# Patient Record
Sex: Female | Born: 1970
Health system: Southern US, Community
[De-identification: ages and names within clinical notes are randomized; demographics above are authoritative.]

## PROBLEM LIST (undated history)

## (undated) DIAGNOSIS — N189 Chronic kidney disease, unspecified: Secondary | ICD-10-CM

## (undated) DIAGNOSIS — D649 Anemia, unspecified: Secondary | ICD-10-CM

## (undated) DIAGNOSIS — Z1371 Encounter for nonprocreative screening for genetic disease carrier status: Secondary | ICD-10-CM

## (undated) DIAGNOSIS — N139 Obstructive and reflux uropathy, unspecified: Secondary | ICD-10-CM

## (undated) DIAGNOSIS — Z8041 Family history of malignant neoplasm of ovary: Secondary | ICD-10-CM

## (undated) HISTORY — PX: TONSILLECTOMY: SUR1361

## (undated) HISTORY — PX: BACK SURGERY: SHX140

## (undated) HISTORY — DX: Encounter for nonprocreative screening for genetic disease carrier status: Z13.71

## (undated) HISTORY — PX: APPENDECTOMY: SHX54

## (undated) HISTORY — DX: Chronic kidney disease, unspecified: N18.9

## (undated) HISTORY — PX: CHOLECYSTECTOMY: SHX55

## (undated) HISTORY — PX: ABDOMINAL HYSTERECTOMY: SHX81

## (undated) HISTORY — DX: Family history of malignant neoplasm of ovary: Z80.41

## (undated) HISTORY — DX: Anemia, unspecified: D64.9

---

## 2003-10-27 ENCOUNTER — Emergency Department: Payer: Self-pay | Admitting: Emergency Medicine

## 2004-05-13 ENCOUNTER — Emergency Department: Payer: Self-pay | Admitting: Emergency Medicine

## 2004-05-13 ENCOUNTER — Other Ambulatory Visit: Payer: Self-pay

## 2004-11-07 ENCOUNTER — Emergency Department: Payer: Self-pay | Admitting: Emergency Medicine

## 2005-04-21 ENCOUNTER — Ambulatory Visit: Payer: Self-pay | Admitting: Unknown Physician Specialty

## 2005-05-11 ENCOUNTER — Inpatient Hospital Stay: Payer: Self-pay | Admitting: Unknown Physician Specialty

## 2005-05-22 ENCOUNTER — Ambulatory Visit: Payer: Self-pay | Admitting: Unknown Physician Specialty

## 2005-05-27 ENCOUNTER — Other Ambulatory Visit: Payer: Self-pay

## 2005-05-27 ENCOUNTER — Inpatient Hospital Stay: Payer: Self-pay | Admitting: Unknown Physician Specialty

## 2005-10-18 ENCOUNTER — Ambulatory Visit: Payer: Self-pay | Admitting: Internal Medicine

## 2005-10-24 ENCOUNTER — Encounter (INDEPENDENT_AMBULATORY_CARE_PROVIDER_SITE_OTHER): Payer: Self-pay | Admitting: Specialist

## 2005-10-24 ENCOUNTER — Ambulatory Visit: Payer: Self-pay | Admitting: Internal Medicine

## 2005-11-03 ENCOUNTER — Ambulatory Visit: Payer: Self-pay | Admitting: Unknown Physician Specialty

## 2005-11-09 ENCOUNTER — Emergency Department: Payer: Self-pay | Admitting: Emergency Medicine

## 2005-11-09 ENCOUNTER — Other Ambulatory Visit: Payer: Self-pay

## 2006-01-01 ENCOUNTER — Ambulatory Visit: Payer: Self-pay | Admitting: Internal Medicine

## 2006-03-14 ENCOUNTER — Ambulatory Visit: Payer: Self-pay

## 2006-03-23 ENCOUNTER — Ambulatory Visit: Payer: Self-pay | Admitting: Unknown Physician Specialty

## 2006-04-02 ENCOUNTER — Ambulatory Visit: Payer: Self-pay | Admitting: Unknown Physician Specialty

## 2006-04-04 ENCOUNTER — Ambulatory Visit: Payer: Self-pay

## 2006-04-07 ENCOUNTER — Emergency Department: Payer: Self-pay | Admitting: General Practice

## 2006-06-20 ENCOUNTER — Emergency Department: Payer: Self-pay | Admitting: General Practice

## 2006-07-05 ENCOUNTER — Ambulatory Visit: Payer: Self-pay | Admitting: Internal Medicine

## 2006-09-07 ENCOUNTER — Emergency Department: Payer: Self-pay | Admitting: Emergency Medicine

## 2006-09-07 ENCOUNTER — Ambulatory Visit: Payer: Self-pay

## 2006-09-07 ENCOUNTER — Other Ambulatory Visit: Payer: Self-pay

## 2006-10-20 ENCOUNTER — Other Ambulatory Visit: Payer: Self-pay

## 2006-10-20 ENCOUNTER — Emergency Department: Payer: Self-pay | Admitting: Emergency Medicine

## 2006-10-21 ENCOUNTER — Ambulatory Visit: Payer: Self-pay | Admitting: Emergency Medicine

## 2006-12-17 ENCOUNTER — Emergency Department (HOSPITAL_COMMUNITY): Admission: EM | Admit: 2006-12-17 | Discharge: 2006-12-17 | Payer: Self-pay | Admitting: Emergency Medicine

## 2007-02-27 ENCOUNTER — Emergency Department: Payer: Self-pay | Admitting: Emergency Medicine

## 2007-03-13 ENCOUNTER — Other Ambulatory Visit: Payer: Self-pay

## 2007-03-13 ENCOUNTER — Emergency Department: Payer: Self-pay | Admitting: Emergency Medicine

## 2007-07-05 ENCOUNTER — Ambulatory Visit: Payer: Self-pay | Admitting: Internal Medicine

## 2007-07-16 ENCOUNTER — Ambulatory Visit: Payer: Self-pay

## 2007-11-01 ENCOUNTER — Ambulatory Visit: Payer: Self-pay

## 2007-12-18 ENCOUNTER — Ambulatory Visit: Payer: Self-pay | Admitting: General Practice

## 2007-12-30 ENCOUNTER — Ambulatory Visit: Payer: Self-pay | Admitting: General Practice

## 2008-01-14 ENCOUNTER — Encounter: Payer: Self-pay | Admitting: General Practice

## 2008-02-10 ENCOUNTER — Encounter: Payer: Self-pay | Admitting: General Practice

## 2008-03-09 ENCOUNTER — Encounter: Payer: Self-pay | Admitting: General Practice

## 2008-04-09 ENCOUNTER — Encounter: Payer: Self-pay | Admitting: General Practice

## 2009-01-12 ENCOUNTER — Ambulatory Visit: Payer: Self-pay | Admitting: Cardiovascular Disease

## 2010-02-02 ENCOUNTER — Ambulatory Visit: Payer: Self-pay | Admitting: Unknown Physician Specialty

## 2010-02-20 ENCOUNTER — Emergency Department: Payer: Self-pay | Admitting: Emergency Medicine

## 2010-03-24 ENCOUNTER — Ambulatory Visit: Payer: Self-pay | Admitting: Neurosurgery

## 2010-05-24 NOTE — Consult Note (Signed)
NAMEZOHAR, LAING                 ACCOUNT NO.:  [id number]   MEDICAL RECORD NO.:  [id number]          PATIENT TYPE:  EMS   LOCATION:  MAJO                         FACILITY:  [facility]   PHYSICIAN:  Pramod P. Pearlean Brownie, MD    DATE OF BIRTH:  11-30-1970   DATE OF CONSULTATION:  12/17/2006  DATE OF DISCHARGE:                                 CONSULTATION   REASON FOR REFERRAL:  Code stroke.   HISTORY OF PRESENT ILLNESS:  Ms. Winstanley is a 40 year old [place] lady  who developed the sudden onset of left facial numbness at about 10:30  a.m. today.  She was apparently in a class talking to some other ladies,  and she complained of her numbness in the face.  She was subsequently  slowly laid down without actually falling down.  She was subsequently  found to have a depressed level of sensorium, not moving the left side  well.  EMS were called and they found her to be intermittently opening  her eyes and following commands.  Dr. Ignacia Palma saw the patient and  noticed some drawing of the face on the left side.  Subsequently, a CT  scan of the head was obtained which showed no acute abnormality.  She  has no known prior history of stroke, TIA, seizures, migraine headaches  or significant neurological problems.   PAST MEDICAL HISTORY:  1. Constipation.  2. Depression.  3. Hypertension.  4. Hypoglycemia.   HOME MEDICATIONS:  1. Lasix.  2. Estrogen.   PAST SURGICAL HISTORY:  Hysterectomy.   SOCIAL HISTORY:  The patient is married and lives with her husband in  [place].  She does not smoke or drink.   PHYSICAL EXAMINATION:  GENERAL:  An obese young Arabic lady who is not  in distress.  VITAL SIGNS:  She is afebrile, pulse rate is 88 per minute and regular,  respiratory 22 per minute, blood pressure 138/123.  Distal pulses well  felt.  HEENT:  The head is nontraumatic.  ENT exam unremarkable.  NECK:  Supple.  There is no bruit.  CARDIAC:  Regular heart sounds.  LUNGS:  Clear to  auscultation.  NEUROLOGIC:  The patient is awake but she tends to keep her eyes closed.  She does open her eyes and follows commands.  Her speech is slow and  hesitant but without significant dysarthria.  She follows commands well.  She is oriented to time, place and person.  Eye movements are full  range.  There is no nystagmus.  She blinks to threat bilaterally.  She  keeps the left side of her face drawn; when she is distracted, the face  seems fairly symmetric.  When she is asked to smile, she smiles  asymmetrically, and the right side of the face does not move as much.  Her tongue is midline.  Motor system exam reveals subjective decreased  movement on the left side.  She is able to move her left leg and hand  off the bed, but she is unable to hold it against gravity.  When her  left upper extremity is held  on top of her face, she moves it down  without striking the face, but when it is held up above her belly, it  falls flat.  She has very variable strength testing of the left side  when she is distracted.  Her strength is variable.  She has subjective  decrease in touch and pinprick sensation in the right upper extremity  and left lower extremity which is crossed.  Deep tendon reflexes are  symmetric.  Plantars are both downgoing.  Data reviewed.  CT scan of the  head noncontrast study is unremarkable.  There is no acute abnormality  noted.  Admission labs revealed a slightly low potassium of 3.4, a  __________  of 150 and ALT of 39.  The patient has an urticarial rash  over her upper extremities which, as per the husband, has been around  for a couple of weeks.   IMPRESSION:  A 36-year lady with symptoms of sudden onset of left facial  numbness and left body weakness with a neurological exam which shows  variable left-sided weakness and sensory loss which is non-anatomical.  Her history exam findings are likely consistent with conversion  reaction; I doubt this represents acute  stroke.  She has no known risk  factors, and her exam is extremely variable.   PLAN:  I had a long discussion with the patient and her husband in  regards to her presentation.  Plan for evaluation and treatment.  I  would recommend that we obtain an MRI scan to rule out a stroke, though  I doubt this based on history and exam.  She most likely has some  underlying psychosocial stressors which may be playing a role.  She has  an urticarial rash, and evaluation and treatment for this for possible  allergy as per ER physician.   Thank you for the referral.           ______________________________  Sunny Schlein. Pearlean Brownie, MD     PPS/MEDQ  D:  12/17/2006  T:  12/17/2006  Job:  [id number]

## 2010-05-27 NOTE — Assessment & Plan Note (Signed)
[facility]                           GASTROENTEROLOGY OFFICE NOTE   Vickie Turner, Vickie Turner                        MRN:          [id number]  DATE:10/18/2005                            DOB:          [birth year]    REFERRING PHYSICIAN:  Constance Kincius   HISTORY OF PRESENT ILLNESS:  The patient is a very nice, 40 year old,  [place] female.  She is here at the referral of Dr. Harold Hedge for  colonoscopy as part of the evaluation of pelvic pain.  Dr. Harold Hedge has  forwarded complete progress notes dating back to June of this year.  Ms.  Turner suffers from left lower quadrant abdominal pain and rectal pain.  She  has a history of extensive endometriosis requiring multiple surgeries.  Her  last operation was on May 13, 2005, with supracervical hysterectomy and left  salpingo-oophorectomy and laparoscopic right simple oophorectomy and lysis  of adhesions on May 27, 2005.  Her pelvic pain has increased and is  associated with sitting, standing, and any sort of exercise.  She also has  dyspareunia and pain with bowel movements.  She is actually afraid to push  because of severe rectal pain with evacuation.  She denies any rectal  bleeding.  Because of chronic constipation, she has resorted to over-the-  counter laxatives, Ex-Lax 3-4 tablets several times a week.  The patient had  normal bowel habits until about 15 years ago, then developed progressive  constipation.  She has 4 children, ages 38, 53, 92 and 61.  Her level of  activity has markedly decreased since the last surgery because of pain with  exertion.  Consequently she has gained some weight.  There is a positive  family history of colon cancer in her sister who died at age 60 in [place].   MEDICATIONS:  Estrogen hormones.   PAST SURGICAL HISTORY:  1. Hysterectomy.  2. Appendectomy.  3. Back surgery.   FAMILY HISTORY:  Positive for colon cancer, ovarian cancer in another  sister, uterine cancer in  another sister, heart disease in brother.   SOCIAL HISTORY:  She is married with 4 children.  She has a Systems analyst.  She is currently a Consulting civil engineer.  She does not smoke, does not drink  alcohol.   REVIEW OF SYSTEMS:  Weight gain, swelling of her feet, complain of sleeping  problems, anemia, back pain, headaches, severe fatigue, muscle cramps.   PHYSICAL EXAMINATION:  VITAL SIGNS:  Blood pressure 132/80, pulse 80, weight  227 pounds.  GENERAL:  She is mildly overweight, alert and oriented in no distress.  LUNGS:  Clear to auscultation.  CARDIOVASCULAR:  Normal S1 and S2.  ABDOMEN:  Soft.  Mildly obese but relaxes.  Normal active bowel sounds.  There was diffuse tenderness throughout the abdomen even on light touch,  more so however in both lower quadrants and most in the left lower quadrant  all the way to the left pubic ramus and all the way to the left upper  quadrant.  I could not appreciate any fullness or mass or stool.  There was  no  rebound.  Straight leg raising was negative.  There was no costovertebral  angle tenderness on that side.  RECTAL:  Mildly tender rectal sphincter, which was normal otherwise.  Digital examination was quite painful especially the palpation of the rectal  ampulla circumferentially seemed to precipitate a lot of discomfort.  Stool  was found, hemoccult negative, in small amounts.   IMPRESSION:  75. A 40 year old [place] female with severe pelvic pain and rectal pain.      Her entire pelvic floor seems to be very sensitive.  Her symptoms of      pain are aggravated by constipation which is partly functional and      partly may be related to use of pain medication as well as inactivity.      There is no evidence of occult gastrointestinal blood loss but she has      a strong family history of colon cancer in her sister who died at      rather young age.  Although I doubt that she has any serious structural      lesions of the colon, she would  benefit from colonoscopy to assess the      rectum, sigmoid colon and rule out thickening, partial obstruction or      extrinsic compression, also to rule out redundancy of her left colon.  2. Severe endometriosis of pelvis, status post hysterectomy and bilateral      salpingo-oophorectomy, rule out residual endometriosis versus      adhesions.   PLAN:  1. Colonoscopy scheduled with routine colonoscopic prep.  2. Start Amitiza 25 mcg b.i.d.  3. Levsin sublingually 0.125 mg on trial basis until colonoscopy to see      whether antispasmodic may help her pelvic pain.   Thank you.       Hedwig Morton. Juanda Chance, MD      DMB/MedQ  DD:  10/18/2005  DT:  10/20/2005  Job #:  [id number]   cc:   Vickie Barth, MD

## 2010-05-27 NOTE — Assessment & Plan Note (Signed)
[facility]                         GASTROENTEROLOGY OFFICE NOTE   FORESTINE, MACHO                        MRN:          [id number]  DATE:01/01/2006                            DOB:          1970/03/20    Vickie Turner is a 40 year old female who we have been evaluating for  chronic  pelvic pain and chronic constipation.  As part of the  evaluation she underwent colonoscopy on October 24, 2005 which showed  normal colon anatomy but the exam was rather difficult because she had  an extreme amount of pain throughout the entire colon exam, requiring a  large amount of conscious sedation.  There was no evidence of  redundancy, extrinsic compression or any mucosal disease.  She was put  on Amitiza 24 mcg twice a day and hyoscyamine sublingually 0.125 mg  twice a day, which worked very well for about a month, but then the  effects have worn out and she is back to being quite constipated and  having pain.  The pain is mostly in the left lower quadrant and across  lower abdomen.  It is partly relieved by having a bowel movement.  She  is now currently on Ex-Lax 4 to 5 every night, which gives her cramps  and usually moves a formed stool in the morning.  She denies any rectal  bleeding.  She tried to make an appointment with Dr. Mechele Claude in  [place] but required referral from a physician.   MEDICATIONS:  1. Estrogen.  2. Amitiza 24 mcg b.i.d.  3. Levsin 0.125 mg p.r.n.  4. Ex-Lax.   PHYSICAL EXAMINATION:  Blood pressure 110/70, pulse 78, weight 236  pounds which represents a weight gain of 9 pounds since we last saw her  on October 18, 2005.  ABDOMEN:  Today shows diffusely tender abdomen with normoactive bowel  sounds, most of the tenderness in the left low and middle quadrants but  also around the transverse colon and the right colon all the way to the  cecum.  There is no distention, no palpable mass and no rebound.  RECTAL:  Not repeated  today.   IMPRESSION:  A 40 year old [place] female who has chronic constipation  and chronic pelvic pain with history of endometriosis and negative  colonoscopy exam within the last 2 months.  She had an unremarkable MRI  over the lumbosacral spine which showed 3-4, 4-5, and 5-1 disc  degeneration but no protrusions, no paraspinal lesions.   PLAN:  I have discussed the use of laxative with Vickie Turner.  We  will  start her on  MiraLax 17 gm every morning and Ex-Lax 1 or 2 tablets at  bedtime.  At the same time, I am making a referral for her to see Dr.  Mechele Claude in [facility] for pelvic floor dysfunction,  constipation, to rule out functional rectal problems, rectocele.  I  would like to see her again in 6 to 8 weeks to possibly adjust her  laxative regimen.     Hedwig Morton. Juanda Chance, MD  Electronically Signed    DMB/MedQ  DD: 01/01/2006  DT: 01/01/2006  Job #: [id number]   cc:   Ladean Raya Kincius

## 2010-10-17 LAB — DIFFERENTIAL
Basophils Absolute: 0
Basophils Relative: 0
Eosinophils Absolute: 0.1 — ABNORMAL LOW
Lymphs Abs: 2.6
Monocytes Absolute: 0.4
Monocytes Relative: 4
Neutrophils Relative %: 65

## 2010-10-17 LAB — URINE MICROSCOPIC-ADD ON

## 2010-10-17 LAB — CBC
HCT: 39.7
Hemoglobin: 13.7
MCHC: 34.5
MCV: 78.2
RBC: 5.07
RDW: 13
WBC: 8.9

## 2010-10-17 LAB — URINE CULTURE

## 2010-10-17 LAB — COMPREHENSIVE METABOLIC PANEL
AST: 32
Albumin: 3.9
Alkaline Phosphatase: 150 — ABNORMAL HIGH
BUN: 11
Potassium: 3.4 — ABNORMAL LOW
Sodium: 137

## 2010-10-17 LAB — PROTIME-INR
INR: 1
Prothrombin Time: 13.2

## 2010-10-17 LAB — URINALYSIS, ROUTINE W REFLEX MICROSCOPIC
Hgb urine dipstick: NEGATIVE
Nitrite: NEGATIVE
Protein, ur: NEGATIVE
Specific Gravity, Urine: 1.011

## 2010-10-17 LAB — APTT: aPTT: 28

## 2010-10-17 LAB — CK TOTAL AND CKMB (NOT AT ARMC)
CK, MB: 0.7
Total CK: 152

## 2011-04-04 ENCOUNTER — Emergency Department: Payer: Self-pay | Admitting: Emergency Medicine

## 2011-04-04 LAB — COMPREHENSIVE METABOLIC PANEL
Albumin: 3.8 g/dL (ref 3.4–5.0)
BUN: 11 mg/dL (ref 7–18)
Co2: 30 mmol/L (ref 21–32)
Creatinine: 0.65 mg/dL (ref 0.60–1.30)
EGFR (Non-African Amer.): >60
Glucose: 74 mg/dL (ref 65–99)
Osmolality: 287 (ref 275–301)
SGOT(AST): 24 U/L (ref 15–37)
SGPT (ALT): 19 U/L
Sodium: 145 mmol/L (ref 136–145)

## 2011-04-04 LAB — CBC
HCT: 38.1 % (ref 35.0–47.0)
HGB: 13 g/dL (ref 12.0–16.0)
MCH: 27.5 pg (ref 26.0–34.0)
MCHC: 34.2 g/dL (ref 32.0–36.0)

## 2011-04-04 LAB — TROPONIN I: Troponin-I: <0.02 ng/mL

## 2011-06-09 ENCOUNTER — Emergency Department: Payer: Self-pay | Admitting: Emergency Medicine

## 2011-06-09 LAB — CBC
HGB: 11.1 g/dL — ABNORMAL LOW (ref 12.0–16.0)
MCH: 27.6 pg (ref 26.0–34.0)
MCHC: 34.2 g/dL (ref 32.0–36.0)
MCV: 81 fL (ref 80–100)
Platelet: 181 10*3/uL (ref 150–440)
RBC: 4.04 10*6/uL (ref 3.80–5.20)

## 2011-06-09 LAB — BASIC METABOLIC PANEL
Anion Gap: 8 (ref 7–16)
BUN: 12 mg/dL (ref 7–18)
Calcium, Total: 8.2 mg/dL — ABNORMAL LOW (ref 8.5–10.1)
Chloride: 108 mmol/L — ABNORMAL HIGH (ref 98–107)
Co2: 27 mmol/L (ref 21–32)
EGFR (African American): >60
EGFR (Non-African Amer.): >60
Osmolality: 283 (ref 275–301)
Potassium: 3.3 mmol/L — ABNORMAL LOW (ref 3.5–5.1)
Sodium: 143 mmol/L (ref 136–145)

## 2011-06-09 LAB — URINALYSIS, COMPLETE
Glucose,UR: NEGATIVE mg/dL (ref 0–75)
Ketone: NEGATIVE
Leukocyte Esterase: NEGATIVE
Nitrite: NEGATIVE
Ph: 8 (ref 4.5–8.0)
RBC,UR: 1 /HPF (ref 0–5)
Specific Gravity: 1.01 (ref 1.003–1.030)
Squamous Epithelial: NONE SEEN
WBC UR: 2 /HPF (ref 0–5)

## 2011-11-16 ENCOUNTER — Ambulatory Visit: Payer: Self-pay | Admitting: Neurology

## 2012-02-25 ENCOUNTER — Emergency Department: Payer: Self-pay | Admitting: Emergency Medicine

## 2012-02-25 LAB — URINALYSIS, COMPLETE
Bilirubin,UR: NEGATIVE
Glucose,UR: NEGATIVE mg/dL (ref 0–75)
Ketone: NEGATIVE
Nitrite: NEGATIVE
Ph: 5 (ref 4.5–8.0)
Protein: NEGATIVE
Specific Gravity: 1.026 (ref 1.003–1.030)
Squamous Epithelial: <1

## 2012-02-25 LAB — WET PREP, GENITAL

## 2013-05-01 ENCOUNTER — Other Ambulatory Visit: Payer: Self-pay | Admitting: Orthopedic Surgery

## 2013-05-01 DIAGNOSIS — M1710 Unilateral primary osteoarthritis, unspecified knee: Secondary | ICD-10-CM

## 2013-05-01 DIAGNOSIS — M25561 Pain in right knee: Secondary | ICD-10-CM

## 2013-05-04 ENCOUNTER — Other Ambulatory Visit: Payer: Self-pay

## 2013-05-09 ENCOUNTER — Other Ambulatory Visit: Payer: Self-pay

## 2013-05-10 ENCOUNTER — Ambulatory Visit
Admission: RE | Admit: 2013-05-10 | Discharge: 2013-05-10 | Disposition: A | Discharge status: Home / Self Care | Payer: BC Managed Care – PPO | Source: Ambulatory Visit | Attending: Orthopedic Surgery | Admitting: Orthopedic Surgery

## 2013-05-10 DIAGNOSIS — M1710 Unilateral primary osteoarthritis, unspecified knee: Secondary | ICD-10-CM

## 2013-05-10 DIAGNOSIS — M25561 Pain in right knee: Secondary | ICD-10-CM

## 2013-05-19 ENCOUNTER — Emergency Department: Payer: Self-pay | Admitting: Emergency Medicine

## 2013-05-19 LAB — CBC WITH DIFFERENTIAL/PLATELET
BASOS PCT: 0.4 %
Basophil #: 0 10*3/uL (ref 0.0–0.1)
Eosinophil #: 0.1 10*3/uL (ref 0.0–0.7)
Eosinophil %: 0.8 %
HCT: 40.4 % (ref 35.0–47.0)
HGB: 13.5 g/dL (ref 12.0–16.0)
LYMPHS PCT: 45.1 %
Lymphocyte #: 3.2 10*3/uL (ref 1.0–3.6)
MCH: 26.7 pg (ref 26.0–34.0)
MCHC: 33.6 g/dL (ref 32.0–36.0)
MCV: 80 fL (ref 80–100)
MONO ABS: 0.4 x10 3/mm (ref 0.2–0.9)
MONOS PCT: 6.1 %
Neutrophil #: 3.3 10*3/uL (ref 1.4–6.5)
Neutrophil %: 47.6 %
Platelet: 219 10*3/uL (ref 150–440)
RBC: 5.06 10*6/uL (ref 3.80–5.20)
RDW: 14.3 % (ref 11.5–14.5)
WBC: 7 10*3/uL (ref 3.6–11.0)

## 2013-05-19 LAB — BASIC METABOLIC PANEL
Anion Gap: 5 — ABNORMAL LOW (ref 7–16)
BUN: 13 mg/dL (ref 7–18)
CALCIUM: 9 mg/dL (ref 8.5–10.1)
CO2: 33 mmol/L — AB (ref 21–32)
Chloride: 101 mmol/L (ref 98–107)
Creatinine: 0.84 mg/dL (ref 0.60–1.30)
EGFR (African American): >60
Glucose: 81 mg/dL (ref 65–99)
OSMOLALITY: 277 (ref 275–301)
POTASSIUM: 3.5 mmol/L (ref 3.5–5.1)
Sodium: 139 mmol/L (ref 136–145)

## 2013-05-19 LAB — URINALYSIS, COMPLETE
BLOOD: NEGATIVE
Bacteria: NEGATIVE
Bilirubin,UR: NEGATIVE
Glucose,UR: NEGATIVE mg/dL (ref 0–75)
KETONE: NEGATIVE
LEUKOCYTE ESTERASE: NEGATIVE
NITRITE: NEGATIVE
PROTEIN: NEGATIVE
Ph: 7 (ref 4.5–8.0)
RBC,UR: NONE SEEN /HPF (ref 0–5)
SPECIFIC GRAVITY: 1.01 (ref 1.003–1.030)

## 2013-05-19 LAB — PREGNANCY, URINE: PREGNANCY TEST, URINE: NEGATIVE m[IU]/mL

## 2013-05-19 LAB — TROPONIN I: Troponin-I: <0.02 ng/mL

## 2013-11-12 ENCOUNTER — Encounter: Payer: Self-pay | Admitting: *Deleted

## 2013-11-13 ENCOUNTER — Ambulatory Visit (INDEPENDENT_AMBULATORY_CARE_PROVIDER_SITE_OTHER): Payer: BC Managed Care – PPO | Admitting: Cardiovascular Disease

## 2013-11-13 ENCOUNTER — Encounter: Payer: Self-pay | Admitting: Cardiovascular Disease

## 2013-11-13 ENCOUNTER — Encounter (INDEPENDENT_AMBULATORY_CARE_PROVIDER_SITE_OTHER): Payer: Self-pay

## 2013-11-13 VITALS — BP 110/80 | HR 63 | Ht 68.0 in | Wt 181.5 lb

## 2013-11-13 DIAGNOSIS — R002 Palpitations: Secondary | ICD-10-CM

## 2013-11-13 DIAGNOSIS — R079 Chest pain, unspecified: Secondary | ICD-10-CM

## 2013-11-13 DIAGNOSIS — R0602 Shortness of breath: Secondary | ICD-10-CM

## 2013-11-13 DIAGNOSIS — R000 Tachycardia, unspecified: Secondary | ICD-10-CM

## 2013-11-13 NOTE — Patient Instructions (Signed)
Your physician has requested that you have an echocardiogram. Echocardiography is a painless test that uses sound waves to create images of your heart. It provides your doctor with information about the size and shape of your heart and how well your heart's chambers and valves are working. This procedure takes approximately one hour. There are no restrictions for this procedure.  Your physician has recommended that you wear a holter monitor. Holter monitors are medical devices that record the heart's electrical activity. Doctors most often use these monitors to diagnose arrhythmias. Arrhythmias are problems with the speed or rhythm of the heartbeat. The monitor is a small, portable device. You can wear one while you do your normal daily activities. This is usually used to diagnose what is causing palpitations/syncope (passing out).   Your physician recommends that you schedule a follow-up appointment in:  As needed

## 2013-11-14 ENCOUNTER — Encounter: Payer: Self-pay | Admitting: Cardiovascular Disease

## 2013-11-14 DIAGNOSIS — R0602 Shortness of breath: Secondary | ICD-10-CM | POA: Insufficient documentation

## 2013-11-14 DIAGNOSIS — R002 Palpitations: Secondary | ICD-10-CM | POA: Insufficient documentation

## 2013-11-14 NOTE — Assessment & Plan Note (Signed)
She reports increased dyspnea and chest pain at the same time with palpitations. Previous echocardiogram showed mild to moderate pulmonary hypertension with normal ejection fraction I requested a repeat echocardiogram for evaluation.

## 2013-11-14 NOTE — Progress Notes (Signed)
Primary care physician: Dr. Beverely RisenFozia Khan.   HPI  This is a pleasant 43 year old female who is here for evaluation of palpitations and shortness of breath.I saw her years ago at [facility] for chest pain which was felt to be due to esophageal spasm at that time. She responded to treatment with diltiazem. Echocardiogram done in 2011 showed normal LV systolic function, grade 1 diastolic dysfunction, mild mitral regurgitation, mild tricuspid regurgitation and mild pulmonary hypertension with estimated systolic pulmonary pressure between 40-50 mmHg. She is here for symptoms of increased palpitations which has been happening frequently almost every other day. It is described as palpitations associated with dizziness but no syncope or presyncope. She complains of increased fatigue, exertional dyspnea and occasional substernal chest tightness. She does not consume any caffeinated products. She had routine labs done recently and they were unremarkable including normal thyroid function. There has been no increased stress. Family history is negative for coronary artery disease or arrhythmia. Her brother had valvular heart disease due to rheumatic fever.  Allergies  Allergen Reactions  . Sulfa Antibiotics      Current Outpatient Prescriptions on File Prior to Visit  Medication Sig Dispense Refill  . levocetirizine (XYZAL) 5 MG tablet Take 5 mg by mouth as needed.     . zolpidem (AMBIEN) 10 MG tablet Take 10 mg by mouth at bedtime as needed for sleep.     No current facility-administered medications on file prior to visit.     Past Medical History  Diagnosis Date  . Hyperlipidemia   . Hypertension   . Back pain   . Chronic kidney disease      Past Surgical History  Procedure Laterality Date  . Appendectomy    . Back surgery    . Cholecystectomy    . Abdominal hysterectomy    . Tonsillectomy Bilateral      Family History  Problem Relation Age of Onset  . Heart Problems  Brother      History   Social History  . Marital Status: Married    Spouse Name: N/A    Number of Children: N/A  . Years of Education: N/A   Occupational History  . Not on file.   Social History Main Topics  . Smoking status: Never Smoker   . Smokeless tobacco: Not on file  . Alcohol Use: No  . Drug Use: No  . Sexual Activity: Not on file   Other Topics Concern  . Not on file   Social History Narrative     ROS A 10 point review of system was performed. It is negative other than that mentioned in the history of present illness.   PHYSICAL EXAM   BP 110/80 mmHg  Pulse 63  Ht 5\' 8"  (1.727 m)  Wt 181 lb 8 oz (82.328 kg)  BMI 27.60 kg/m2 Constitutional: She is oriented to person, place, and time. She appears well-developed and well-nourished. No distress.  HENT: No nasal discharge.  Head: Normocephalic and atraumatic.  Eyes: Pupils are equal and round. No discharge.  Neck: Normal range of motion. Neck supple. No JVD present. No thyromegaly present.  Cardiovascular: Normal rate, regular rhythm, normal heart sounds. Exam reveals no gallop and no friction rub. No murmur heard.  Pulmonary/Chest: Effort normal and breath sounds normal. No stridor. No respiratory distress. She has no wheezes. She has no rales. She exhibits no tenderness.  Abdominal: Soft. Bowel sounds are normal. She exhibits no distension. There is no tenderness. There is no rebound  and no guarding.  Musculoskeletal: Normal range of motion. She exhibits no edema and no tenderness.  Neurological: She is alert and oriented to person, place, and time. Coordination normal.  Skin: Skin is warm and dry. No rash noted. She is not diaphoretic. No erythema. No pallor.  Psychiatric: She has a normal mood and affect. Her behavior is normal. Judgment and thought content normal.     ZOX:WRUEAVEKG:normal sinus rhythm with no significant ST or T wave changes. Normal PR and QT intervals.   ASSESSMENT AND PLAN

## 2013-11-14 NOTE — Assessment & Plan Note (Signed)
She is having frequent palpitations which could be due to premature beats. Thyroid function was normal recently. There is no caffeine intake. I requested a 48-hour Holter monitor for evaluation.

## 2013-11-24 ENCOUNTER — Telehealth: Payer: Self-pay

## 2013-11-24 NOTE — Telephone Encounter (Signed)
Pt states she has not received her monitor yet. Please call and advise.

## 2013-11-25 NOTE — Telephone Encounter (Signed)
Notified patient [facility] her today for when to come have monitor placed.

## 2013-11-26 ENCOUNTER — Other Ambulatory Visit: Payer: BC Managed Care – PPO

## 2013-12-19 ENCOUNTER — Other Ambulatory Visit (INDEPENDENT_AMBULATORY_CARE_PROVIDER_SITE_OTHER): Payer: BC Managed Care – PPO

## 2013-12-19 ENCOUNTER — Other Ambulatory Visit: Payer: Self-pay

## 2013-12-19 DIAGNOSIS — R0602 Shortness of breath: Secondary | ICD-10-CM

## 2013-12-19 DIAGNOSIS — R079 Chest pain, unspecified: Secondary | ICD-10-CM

## 2013-12-26 ENCOUNTER — Telehealth: Payer: Self-pay | Admitting: *Deleted

## 2013-12-26 NOTE — Telephone Encounter (Signed)
Informed patient per Dr. Kirke CorinArida her holter showed:  Normal sinus rhythm with occasional PVC's   Patient verbalized understanding

## 2013-12-29 ENCOUNTER — Other Ambulatory Visit: Payer: Self-pay

## 2013-12-29 ENCOUNTER — Ambulatory Visit (INDEPENDENT_AMBULATORY_CARE_PROVIDER_SITE_OTHER): Payer: BC Managed Care – PPO | Admitting: *Deleted

## 2013-12-29 DIAGNOSIS — R002 Palpitations: Secondary | ICD-10-CM

## 2014-09-20 ENCOUNTER — Emergency Department: Payer: BLUE CROSS/BLUE SHIELD

## 2014-09-20 ENCOUNTER — Emergency Department
Admission: EM | Admit: 2014-09-20 | Discharge: 2014-09-20 | Disposition: A | Discharge status: Home / Self Care | Payer: BLUE CROSS/BLUE SHIELD | Attending: Emergency Medicine | Admitting: Emergency Medicine

## 2014-09-20 ENCOUNTER — Encounter: Payer: Self-pay | Admitting: Emergency Medicine

## 2014-09-20 DIAGNOSIS — Y998 Other external cause status: Secondary | ICD-10-CM | POA: Diagnosis not present

## 2014-09-20 DIAGNOSIS — Y92480 Sidewalk as the place of occurrence of the external cause: Secondary | ICD-10-CM | POA: Diagnosis not present

## 2014-09-20 DIAGNOSIS — I129 Hypertensive chronic kidney disease with stage 1 through stage 4 chronic kidney disease, or unspecified chronic kidney disease: Secondary | ICD-10-CM | POA: Diagnosis not present

## 2014-09-20 DIAGNOSIS — S99912A Unspecified injury of left ankle, initial encounter: Secondary | ICD-10-CM | POA: Diagnosis present

## 2014-09-20 DIAGNOSIS — W2209XA Striking against other stationary object, initial encounter: Secondary | ICD-10-CM | POA: Diagnosis not present

## 2014-09-20 DIAGNOSIS — Y9389 Activity, other specified: Secondary | ICD-10-CM | POA: Diagnosis not present

## 2014-09-20 DIAGNOSIS — S9002XA Contusion of left ankle, initial encounter: Secondary | ICD-10-CM | POA: Diagnosis not present

## 2014-09-20 DIAGNOSIS — N189 Chronic kidney disease, unspecified: Secondary | ICD-10-CM | POA: Insufficient documentation

## 2014-09-20 MED ORDER — IBUPROFEN 800 MG PO TABS: 800.0000 mg | ORAL_TABLET | Freq: Three times a day (TID) | ORAL | Status: DC | PRN

## 2014-09-20 MED ORDER — KETOROLAC TROMETHAMINE 60 MG/2ML IM SOLN
60.0000 mg | Freq: Once | INTRAMUSCULAR | Status: AC
  Administered 2014-09-20: 60 mg via INTRAMUSCULAR
  Filled 2014-09-20: qty 2

## 2014-09-20 MED ORDER — HYDROCODONE-ACETAMINOPHEN 5-325 MG PO TABS: 1.0000 | ORAL_TABLET | ORAL | Status: DC | PRN

## 2014-09-20 NOTE — ED Notes (Signed)
Patient to ER for swelling and pain to left ankle after hitting ankle (on curb?).

## 2014-09-20 NOTE — ED Provider Notes (Signed)
[facility] Emergency Department Provider Note  ____________________________________________  Time seen: Approximately 6:23 PM  I have reviewed the triage vital signs and the nursing notes.   HISTORY  Chief Complaint Ankle Pain    HPI Vickie Turner is a 44 y.o. female who presents for evaluation of left ankle pain after hitting on a curb prior to arrival. Lanes of difficulty ambulating.   Past Medical History  Diagnosis Date  . Hyperlipidemia   . Hypertension   . Back pain   . Chronic kidney disease     Patient Active Problem List   Diagnosis Date Noted  . SOB (shortness of breath) 11/14/2013  . Palpitations 11/14/2013    Past Surgical History  Procedure Laterality Date  . Appendectomy    . Back surgery    . Cholecystectomy    . Abdominal hysterectomy    . Tonsillectomy Bilateral     Current Outpatient Rx  Name  Route  Sig  Dispense  Refill  . HYDROcodone-acetaminophen (NORCO) 5-325 MG per tablet   Oral   Take 1-2 tablets by mouth every 4 (four) hours as needed for moderate pain.   10 tablet   0   . ibuprofen (ADVIL,MOTRIN) 800 MG tablet   Oral   Take 1 tablet (800 mg total) by mouth every 8 (eight) hours as needed.   30 tablet   0   . levocetirizine (XYZAL) 5 MG tablet   Oral   Take 5 mg by mouth as needed.          Marland Kitchen PREMARIN 0.3 MG tablet   Oral   Take 0.3 mg by mouth as needed.       0   . zolpidem (AMBIEN) 10 MG tablet   Oral   Take 10 mg by mouth at bedtime as needed for sleep.           Allergies Sulfa antibiotics  Family History  Problem Relation Age of Onset  . Heart Problems Brother     Social History Social History  Substance Use Topics  . Smoking status: Never Smoker   . Smokeless tobacco: None  . Alcohol Use: No   Review of Systems Constitutional: No fever/chills Eyes: No visual changes. ENT: No sore throat. Cardiovascular: Denies chest pain. Respiratory: Denies shortness of  breath. Gastrointestinal: No abdominal pain.  No nausea, no vomiting.  No diarrhea.  No constipation. Genitourinary: Negative for dysuria. Musculoskeletal: Positive for left ankle pain Skin: Negative for rash. Neurological: Negative for headaches, focal weakness or numbness.  10-point ROS otherwise negative.  ____________________________________________   PHYSICAL EXAM:  VITAL SIGNS: ED Triage Vitals  Enc Vitals Group     BP 09/20/14 1818 116/75 mmHg     Pulse Rate 09/20/14 1818 66     Resp 09/20/14 1818 20     Temp 09/20/14 1818 98.1 F (36.7 C)     Temp Source 09/20/14 1818 Oral     SpO2 09/20/14 1818 100 %     Weight 09/20/14 1818 160 lb (72.576 kg)     Height 09/20/14 1818  (1.727 m)     Head Cir --      Peak Flow --      Pain Score 09/20/14 1818 7     Pain Loc --      Pain Edu? --      Excl. in GC? --     Constitutional: Alert and oriented. Well appearing and in no acute distress. Musculoskeletal: No lower  extremity tenderness nor edema.  No joint effusions. Neurologic:  Normal speech and language. No gross focal neurologic deficits are appreciated. No gait instability. Skin:  Skin is warm, dry and intact. No rash noted. Psychiatric: Mood and affect are normal. Speech and behavior are normal.  ____________________________________________   LABS (all labs ordered are listed, but only abnormal results are displayed)  Labs Reviewed - No data to display ____________________________________________   RADIOLOGY  Negative for fracture. Lateral swelling. ____________________________________________   PROCEDURES  Procedure(s) performed: None  Critical Care performed: No  ____________________________________________   INITIAL IMPRESSION / ASSESSMENT AND PLAN / ED COURSE  Pertinent labs & imaging results that were available during my care of the patient were reviewed by me and considered in my medical decision making (see chart for details).  Acute  left ankle contusion with swelling noted. Stirrup splint plus Ace wrap provided crutches patient given prescription for ibuprofen 800 mg and hydrocodone for pain. Patient also PCP or return to the ER with any worsening symptomology. ____________________________________________   FINAL CLINICAL IMPRESSION(S) / ED DIAGNOSES  Final diagnoses:  Ankle contusion, left, initial encounter      Evangeline Dakin, PA-C 09/20/14 1914  Sharyn Creamer, MD 09/20/14 2150

## 2014-09-20 NOTE — Discharge Instructions (Signed)
Contusion °A contusion is a deep bruise. Contusions are the result of an injury that caused bleeding under the skin. The contusion may turn blue, purple, or yellow. Minor injuries will give you a painless contusion, but more severe contusions may stay painful and swollen for a few weeks.  °CAUSES  °A contusion is usually caused by a blow, trauma, or direct force to an area of the body. °SYMPTOMS  °· Swelling and redness of the injured area. °· Bruising of the injured area. °· Tenderness and soreness of the injured area. °· Pain. °DIAGNOSIS  °The diagnosis can be made by taking a history and physical exam. An X-ray, CT scan, or MRI may be needed to determine if there were any associated injuries, such as fractures. °TREATMENT  °Specific treatment will depend on what area of the body was injured. In general, the best treatment for a contusion is resting, icing, elevating, and applying cold compresses to the injured area. Over-the-counter medicines may also be recommended for pain control. Ask your caregiver what the best treatment is for your contusion. °HOME CARE INSTRUCTIONS  °· Put ice on the injured area. °¨ Put ice in a plastic bag. °¨ Place a towel between your skin and the bag. °¨ Leave the ice on for 15-20 minutes, 3-4 times a day, or as directed by your health care provider. °· Only take over-the-counter or prescription medicines for pain, discomfort, or fever as directed by your caregiver. Your caregiver may recommend avoiding anti-inflammatory medicines (aspirin, ibuprofen, and naproxen) for 48 hours because these medicines may increase bruising. °· Rest the injured area. °· If possible, elevate the injured area to reduce swelling. °SEEK IMMEDIATE MEDICAL CARE IF:  °· You have increased bruising or swelling. °· You have pain that is getting worse. °· Your swelling or pain is not relieved with medicines. °MAKE SURE YOU:  °· Understand these instructions. °· Will watch your condition. °· Will get help right  away if you are not doing well or get worse. °Document Released: 10/05/2004 Document Revised: 12/31/2012 Document Reviewed: 10/31/2010 °[employer]® Patient Information ©2015 [employer]. This information is not intended to replace advice given to you by your health care provider. Make sure you discuss any questions you have with your health care provider. ° °

## 2014-09-20 NOTE — ED Notes (Signed)

## 2014-09-23 ENCOUNTER — Encounter: Payer: Self-pay | Admitting: Emergency Medicine

## 2014-09-23 ENCOUNTER — Emergency Department
Admission: EM | Admit: 2014-09-23 | Discharge: 2014-09-23 | Disposition: A | Discharge status: Home / Self Care | Payer: BLUE CROSS/BLUE SHIELD | Attending: Emergency Medicine | Admitting: Emergency Medicine

## 2014-09-23 DIAGNOSIS — N390 Urinary tract infection, site not specified: Secondary | ICD-10-CM | POA: Diagnosis not present

## 2014-09-23 DIAGNOSIS — I129 Hypertensive chronic kidney disease with stage 1 through stage 4 chronic kidney disease, or unspecified chronic kidney disease: Secondary | ICD-10-CM | POA: Diagnosis not present

## 2014-09-23 DIAGNOSIS — R350 Frequency of micturition: Secondary | ICD-10-CM | POA: Diagnosis present

## 2014-09-23 DIAGNOSIS — N189 Chronic kidney disease, unspecified: Secondary | ICD-10-CM | POA: Insufficient documentation

## 2014-09-23 DIAGNOSIS — K120 Recurrent oral aphthae: Secondary | ICD-10-CM | POA: Insufficient documentation

## 2014-09-23 LAB — URINALYSIS COMPLETE WITH MICROSCOPIC (ARMC ONLY)
BILIRUBIN URINE: NEGATIVE
GLUCOSE, UA: NEGATIVE mg/dL
Hgb urine dipstick: NEGATIVE
Nitrite: NEGATIVE
Protein, ur: 100 mg/dL — AB
Specific Gravity, Urine: 1.023 (ref 1.005–1.030)
pH: 5 (ref 5.0–8.0)

## 2014-09-23 LAB — CBC
HEMATOCRIT: 34 % — AB (ref 35.0–47.0)
Hemoglobin: 11.4 g/dL — ABNORMAL LOW (ref 12.0–16.0)
MCH: 26 pg (ref 26.0–34.0)
MCHC: 33.4 g/dL (ref 32.0–36.0)
MCV: 77.8 fL — AB (ref 80.0–100.0)
Platelets: 197 10*3/uL (ref 150–440)
RBC: 4.37 MIL/uL (ref 3.80–5.20)
RDW: 13.6 % (ref 11.5–14.5)
WBC: 7.2 10*3/uL (ref 3.6–11.0)

## 2014-09-23 LAB — BASIC METABOLIC PANEL
ANION GAP: 9 (ref 5–15)
BUN: 14 mg/dL (ref 6–20)
CHLORIDE: 104 mmol/L (ref 101–111)
CO2: 27 mmol/L (ref 22–32)
Calcium: 9 mg/dL (ref 8.9–10.3)
Creatinine, Ser: 0.84 mg/dL (ref 0.44–1.00)
GFR calc Af Amer: >60 mL/min (ref >60)
GFR calc non Af Amer: >60 mL/min (ref >60)
GLUCOSE: 90 mg/dL (ref 65–99)
POTASSIUM: 3.2 mmol/L — AB (ref 3.5–5.1)
Sodium: 140 mmol/L (ref 135–145)

## 2014-09-23 MED ORDER — FLUCONAZOLE 100 MG PO TABS
100.0000 mg | ORAL_TABLET | Freq: Once | ORAL | Status: AC
  Administered 2014-09-23: 100 mg via ORAL
  Filled 2014-09-23: qty 1

## 2014-09-23 MED ORDER — DEXTROSE 5 % IV SOLN
1.0000 g | Freq: Once | INTRAVENOUS | Status: AC
  Administered 2014-09-23: 1 g via INTRAVENOUS
  Filled 2014-09-23: qty 10

## 2014-09-23 NOTE — ED Provider Notes (Signed)
[place] [facility] Emergency Department Provider Note  Time seen: 6:32 PM  I have reviewed the triage vital signs and the nursing notes.   HISTORY  Chief Complaint Urinary Frequency    HPI Vickie Turner is a 44 y.o. female with a past medical history of hyperlipidemia, hypertension, chronic kidney disease, who presents the emergency department with a urinary tract infection. According to the patient ever since a kidney surgery back in April she has been battling intermittent urinary tract infections.Dr. Welton Flakes has been seeing her for these infections, including today. She spoke with me earlier today and wished to send the patient to the emergency department for a round of IV Rocephin, with a plan to discharge the patient and continue daily IV Rocephin in the office. Patient denies any recent nausea, vomiting. She does state continued dysuria with pain in her right flank at times. States chills last night, but denies any measured temperature. Describes her right flank pain is mild.    Past Medical History  Diagnosis Date  . Hyperlipidemia   . Hypertension   . Back pain   . Chronic kidney disease     Patient Active Problem List   Diagnosis Date Noted  . SOB (shortness of breath) 11/14/2013  . Palpitations 11/14/2013    Past Surgical History  Procedure Laterality Date  . Appendectomy    . Back surgery    . Cholecystectomy    . Abdominal hysterectomy    . Tonsillectomy Bilateral     Current Outpatient Rx  Name  Route  Sig  Dispense  Refill  . HYDROcodone-acetaminophen (NORCO) 5-325 MG per tablet   Oral   Take 1-2 tablets by mouth every 4 (four) hours as needed for moderate pain.   10 tablet   0   . ibuprofen (ADVIL,MOTRIN) 800 MG tablet   Oral   Take 1 tablet (800 mg total) by mouth every 8 (eight) hours as needed.   30 tablet   0   . levocetirizine (XYZAL) 5 MG tablet   Oral   Take 5 mg by mouth as needed.          Marland Kitchen PREMARIN 0.3 MG tablet  Oral   Take 0.3 mg by mouth as needed.       0   . zolpidem (AMBIEN) 10 MG tablet   Oral   Take 10 mg by mouth at bedtime as needed for sleep.           Allergies Sulfa antibiotics  Family History  Problem Relation Age of Onset  . Heart Problems Brother     Social History Social History  Substance Use Topics  . Smoking status: Never Smoker   . Smokeless tobacco: None  . Alcohol Use: No    Review of Systems Constitutional: Negative for fever. Positive for chills. Cardiovascular: Negative for chest pain. Respiratory: Negative for shortness of breath. Gastrointestinal: Positive for mild right flank pain. Negative for nausea, vomiting, diarrhea. Genitourinary: Positive for dysuria Neurological: Negative for headache 10-point ROS otherwise negative.  ____________________________________________   PHYSICAL EXAM:  VITAL SIGNS: ED Triage Vitals  Enc Vitals Group     BP 09/23/14 1746 122/68 mmHg     Pulse Rate 09/23/14 1746 64     Resp 09/23/14 1746 18     Temp 09/23/14 1746 97.7 F (36.5 C)     Temp Source 09/23/14 1746 Oral     SpO2 09/23/14 1746 100 %     Weight 09/23/14 1746 163 lb (  73.936 kg)     Height 09/23/14 1746  (1.727 m)     Head Cir --      Peak Flow --      Pain Score 09/23/14 1747 10     Pain Loc --      Pain Edu? --      Excl. in GC? --     Constitutional: Alert and oriented. Well appearing and in no distress. Eyes: Normal exam ENT   Head: Normocephalic and atraumatic.   Mouth/Throat: Mucous membranes are moist. Several small aphthous ulcers noted. Cardiovascular: Normal rate, regular rhythm. No murmur Respiratory: Normal respiratory effort without tachypnea nor retractions. Breath sounds are clear and equal bilaterally. No wheezes/rales/rhonchi. Gastrointestinal: Soft, no abdominal tenderness to palpation. No rebound or guarding. No distention. Patient does have mild right CVA tenderness palpation. Musculoskeletal: Nontender  with normal range of motion in all extremities.  Neurologic:  Normal speech and language. No gross focal neurologic deficits Psychiatric: Mood and affect are normal. Speech and behavior are normal.     INITIAL IMPRESSION / ASSESSMENT AND PLAN / ED COURSE  Pertinent labs & imaging results that were available during my care of the patient were reviewed by me and considered in my medical decision making (see chart for details).  Patient was at the emergency department with continued urinary tract infection sent by her nephrologist for IV antibiotics. I discussed the patient with her nephrologist. We will check basic labs, dose IV Rocephin. The patient usually receives Diflucan with her antibiotics due to yeast infections. We will dose 100 mg of Diflucan.  Labs are largely within normal limits besides known urinary tract infection. We will discharge after Rocephin. The patient is follow up with Dr.Khan tomorrow morning for arrangement of continued IV antibiotics.  ____________________________________________   FINAL CLINICAL IMPRESSION(S) / ED DIAGNOSES  Urinary tract infection   Minna Antis, MD 09/23/14 1932

## 2014-09-23 NOTE — ED Notes (Signed)
Pt to ED with c/o painful urination and urinary frequency, fever/chills x 3 days, states she was called by her PCP and told she had UTI and to come to ED for IV antibiotics

## 2014-09-23 NOTE — Discharge Instructions (Signed)

## 2014-10-02 ENCOUNTER — Encounter
Admission: RE | Disposition: A | Discharge status: Home / Self Care | Payer: Self-pay | Source: Ambulatory Visit | Attending: Vascular Surgery

## 2014-10-02 ENCOUNTER — Ambulatory Visit
Admission: RE | Admit: 2014-10-02 | Discharge: 2014-10-02 | Disposition: A | Discharge status: Home / Self Care | Payer: BLUE CROSS/BLUE SHIELD | Source: Ambulatory Visit | Attending: Vascular Surgery | Admitting: Vascular Surgery

## 2014-10-02 DIAGNOSIS — Z87440 Personal history of urinary (tract) infections: Secondary | ICD-10-CM | POA: Diagnosis not present

## 2014-10-02 DIAGNOSIS — N189 Chronic kidney disease, unspecified: Secondary | ICD-10-CM | POA: Diagnosis not present

## 2014-10-02 DIAGNOSIS — E785 Hyperlipidemia, unspecified: Secondary | ICD-10-CM | POA: Insufficient documentation

## 2014-10-02 DIAGNOSIS — M549 Dorsalgia, unspecified: Secondary | ICD-10-CM | POA: Insufficient documentation

## 2014-10-02 DIAGNOSIS — I129 Hypertensive chronic kidney disease with stage 1 through stage 4 chronic kidney disease, or unspecified chronic kidney disease: Secondary | ICD-10-CM | POA: Diagnosis not present

## 2014-10-02 HISTORY — PX: PERIPHERAL VASCULAR CATHETERIZATION: SHX172C

## 2014-10-02 SURGERY — PICC LINE INSERTION

## 2014-10-02 SURGICAL SUPPLY — 3 items
NDL PERC 4X21 ACCESS (NEEDLE) ×2 IMPLANT
TOWEL OR 17X26 4PK STRL BLUE (TOWEL DISPOSABLE) ×2 IMPLANT
TRAY PICC DUAL PWR 5FR 55CM (TRAY / TRAY PROCEDURE) ×2 IMPLANT

## 2014-10-02 NOTE — Op Note (Signed)
[facility]   OPERATIVE NOTE    PRE-OPERATIVE DIAGNOSIS: recurrent UTIs  POST-OPERATIVE DIAGNOSIS: same  PROCEDURE: 1.  1. Ultrasound guidance for vascular access to right basilic vein 2. Fluoroscopic guidance for placement of catheter 3. Insertion of peripherally inserted central venous catheter to the right basilic vein  SURGEON: Festus Barren, MD  ANESTHESIA: local  ESTIMATED BLOOD LOSS: minimal  FINDING(S): 1.  none  SPECIMEN(S):  none  INDICATIONS:   Vickie Turner is a 44 y.o. female who presents with recurrent UTIs. We are asked to place a PICC line for long term IV access.  Risks and benefits are discussed, and informed consent was obtained.  DESCRIPTION: After obtaining full informed written consent, the patient was brought back to the vascular suite.  The patient's right arm was sterilely prepped and draped, and a sterile surgical field was created. The right basilic vein was accessed under direct ultrasound guidance without difficulty with a micropuncture needle and a permanent image was recorded. A 0.018 wire was then placed into the superior vena cava. Peel-away sheath was placed over the wire. A peripherally inserted central venous catheter was then placed over the wire and the wire and peel-away sheath were removed. The catheter tip was placed into the superior vena cava and was secured at the skin at 31 cm with a sterile dressing. The catheter withdrew blood well and flushed easily with heparinized saline. The patient tolerated the procedure well without complications.  COMPLICATIONS: none  CONDITION: stable  DEW,JASON  10/02/2014, 12:48 PM

## 2014-10-02 NOTE — H&P (Signed)
[facility] Admission History & Physical  MRN : [id number]  Vickie Turner is a 44 y.o. (1970-12-12) female who presents with chief complaint of No chief complaint on file. Marland Kitchen  History of Present Illness: Patient has recurrent UTIs and needs long-term IV antibiotics. There is burning with urination difficult urination but no other specific complaints today. We are asked to place a PICC line.  No current facility-administered medications for this encounter.    Past Medical History  Diagnosis Date  . Hyperlipidemia   . Hypertension   . Back pain   . Chronic kidney disease     Past Surgical History  Procedure Laterality Date  . Appendectomy    . Back surgery    . Cholecystectomy    . Abdominal hysterectomy    . Tonsillectomy Bilateral     Social History Social History  Substance Use Topics  . Smoking status: Never Smoker   . Smokeless tobacco: Not on file  . Alcohol Use: No  No IVDU  Family History Family History  Problem Relation Age of Onset  . Heart Problems Brother   no bleeding disorders, clotting disorders, or autoimmune diseases  Allergies  Allergen Reactions  . Sulfa Antibiotics      REVIEW OF SYSTEMS (Negative unless checked)  Constitutional: Weight loss  Fever  Chills Cardiac: Chest pain   Chest pressure   Palpitations   Shortness of breath when laying flat   Shortness of breath at rest   Shortness of breath with exertion. Vascular:  Pain in legs with walking   Pain in legs at rest   Pain in legs when laying flat   Claudication   Pain in feet when walking  Pain in feet at rest  Pain in feet when laying flat   History of DVT   Phlebitis   Swelling in legs   Varicose veins   Non-healing ulcers Pulmonary:   Uses home oxygen   Productive cough   Hemoptysis   Wheeze  COPD   Asthma Neurologic:  Dizziness  Blackouts   Seizures   History of stroke   History of TIA  Aphasia    Temporary blindness   Dysphagia   Weakness or numbness in arms   Weakness or numbness in legs Musculoskeletal:  Arthritis   Joint swelling   Joint pain   Low back pain Hematologic:  Easy bruising  Easy bleeding   Hypercoagulable state   Anemic  Hepatitis Gastrointestinal:  Blood in stool   Vomiting blood  Gastroesophageal reflux/heartburn   Difficulty swallowing. Genitourinary:  Chronic kidney disease   Difficult urination  Frequent urination  Burning with urination   Blood in urine Skin:  Rashes   Ulcers   Wounds Psychological:  History of anxiety    History of major depression.  Physical Examination  There were no vitals filed for this visit. There is no weight on file to calculate BMI. Gen: WD/WN, NAD Head: [place]/AT, No temporalis wasting. Prominent temp pulse not noted. Ear/Nose/Throat: Hearing grossly intact, nares w/o erythema or drainage, oropharynx w/o Erythema/Exudate,  Eyes: PERRLA, EOMI.  Neck: Supple, no nuchal rigidity.  No bruit or JVD.  Pulmonary:  Good air movement no use of accessory muscles.  Cardiac: RRR, normal S1, S2,  Vascular:  Vessel Right Left  Radial Palpable Palpable  Gastrointestinal: soft, non-tender/non-distended. No guarding/reflex.  Musculoskeletal: M/S 5/5 throughout.  Extremities without ischemic changes.  No deformity or atrophy.  Neurologic: CN 2-12 intact. Pain and light touch intact in extremities.  Symmetrical.  Speech is fluent. Motor exam as listed above. Psychiatric: Judgment intact, Mood & affect appropriate for pt's clinical situation. Dermatologic: No rashes or ulcers noted.  No cellulitis or open wounds. Lymph : No Cervical, Axillary, or Inguinal lymphadenopathy.      CBC Lab Results  Component Value Date   WBC 7.2 09/23/2014   HGB 11.4* 09/23/2014   HCT 34.0* 09/23/2014   MCV 77.8* 09/23/2014   PLT 197 09/23/2014    BMET     Component Value Date/Time   NA 140 09/23/2014 1846   NA 139 05/19/2013 1138   K 3.2* 09/23/2014 1846   K 3.5 05/19/2013 1138   CL 104 09/23/2014 1846   CL 101 05/19/2013 1138   CO2 27 09/23/2014 1846   CO2 33* 05/19/2013 1138   GLUCOSE 90 09/23/2014 1846   GLUCOSE 81 05/19/2013 1138   BUN 14 09/23/2014 1846   BUN 13 05/19/2013 1138   CREATININE 0.84 09/23/2014 1846   CREATININE 0.84 05/19/2013 1138   CALCIUM 9.0 09/23/2014 1846   CALCIUM 9.0 05/19/2013 1138   GFRNONAA >60 09/23/2014 1846   GFRNONAA >60 05/19/2013 1138   GFRNONAA >60 04/04/2011 1141   GFRAA >60 09/23/2014 1846   GFRAA >60 05/19/2013 1138   GFRAA >60 04/04/2011 1141   Estimated Creatinine Clearance: 84.4 mL/min (by C-G formula based on Cr of 0.84).  COAG Lab Results  Component Value Date   INR 1.0 12/17/2006    Radiology Dg Ankle Complete Left  09/20/2014   CLINICAL DATA:  Pain and edema.  Blunt trauma to the ankle.  EXAM: LEFT ANKLE COMPLETE - 3+ VIEW  COMPARISON:  None.  FINDINGS: Ankle mortise congruent. Anatomic alignment. Soft tissue swelling present over the lateral malleolus.  IMPRESSION: Lateral soft tissue swelling.  No osseous injury.   Electronically Signed   By: Andreas Newport M.D.   On: 09/20/2014 18:24      Assessment/Plan 1. Recurrent UTIs. PICC line today 2. HTN stable, cont outpatient meds 3. Hyperlipidemia, stable, continue outpatient meds   DEW,JASON, MD  10/02/2014 12:45 PM

## 2014-10-05 ENCOUNTER — Encounter: Payer: Self-pay | Admitting: Vascular Surgery

## 2014-10-06 ENCOUNTER — Emergency Department
Admission: EM | Admit: 2014-10-06 | Discharge: 2014-10-06 | Disposition: A | Discharge status: Home / Self Care | Payer: BLUE CROSS/BLUE SHIELD | Attending: Internal Medicine | Admitting: Internal Medicine

## 2014-10-06 ENCOUNTER — Encounter: Payer: Self-pay | Admitting: Emergency Medicine

## 2014-10-06 DIAGNOSIS — R531 Weakness: Secondary | ICD-10-CM | POA: Diagnosis not present

## 2014-10-06 DIAGNOSIS — R6883 Chills (without fever): Secondary | ICD-10-CM | POA: Insufficient documentation

## 2014-10-06 DIAGNOSIS — R0981 Nasal congestion: Secondary | ICD-10-CM | POA: Insufficient documentation

## 2014-10-06 LAB — COMPREHENSIVE METABOLIC PANEL
ALBUMIN: 3.7 g/dL (ref 3.5–5.0)
ALT: 13 U/L — AB (ref 14–54)
AST: 24 U/L (ref 15–41)
Alkaline Phosphatase: 85 U/L (ref 38–126)
Anion gap: 7 (ref 5–15)
BILIRUBIN TOTAL: 0.4 mg/dL (ref 0.3–1.2)
BUN: 10 mg/dL (ref 6–20)
CHLORIDE: 105 mmol/L (ref 101–111)
CO2: 27 mmol/L (ref 22–32)
CREATININE: 0.59 mg/dL (ref 0.44–1.00)
Calcium: 8.9 mg/dL (ref 8.9–10.3)
GFR calc Af Amer: >60 mL/min (ref >60)
GLUCOSE: 89 mg/dL (ref 65–99)
Potassium: 3.2 mmol/L — ABNORMAL LOW (ref 3.5–5.1)
Sodium: 139 mmol/L (ref 135–145)
TOTAL PROTEIN: 6.2 g/dL — AB (ref 6.5–8.1)

## 2014-10-06 LAB — URINALYSIS COMPLETE WITH MICROSCOPIC (ARMC ONLY)
Bacteria, UA: NONE SEEN
Bilirubin Urine: NEGATIVE
Glucose, UA: NEGATIVE mg/dL
Hgb urine dipstick: NEGATIVE
KETONES UR: NEGATIVE mg/dL
Leukocytes, UA: NEGATIVE
Nitrite: NEGATIVE
PH: 7 (ref 5.0–8.0)
PROTEIN: NEGATIVE mg/dL
RBC / HPF: NONE SEEN RBC/hpf (ref 0–5)
Specific Gravity, Urine: 1.005 (ref 1.005–1.030)

## 2014-10-06 LAB — CBC WITH DIFFERENTIAL/PLATELET
BASOS ABS: 0 10*3/uL (ref 0–0.1)
BASOS PCT: 1 %
Eosinophils Absolute: 0.1 10*3/uL (ref 0–0.7)
Eosinophils Relative: 1 %
HEMATOCRIT: 32 % — AB (ref 35.0–47.0)
Hemoglobin: 11.1 g/dL — ABNORMAL LOW (ref 12.0–16.0)
LYMPHS PCT: 39 %
Lymphs Abs: 2.3 10*3/uL (ref 1.0–3.6)
MCH: 26.6 pg (ref 26.0–34.0)
MCHC: 34.6 g/dL (ref 32.0–36.0)
MCV: 76.8 fL — AB (ref 80.0–100.0)
Monocytes Absolute: 0.3 10*3/uL (ref 0.2–0.9)
Monocytes Relative: 5 %
NEUTROS ABS: 3.2 10*3/uL (ref 1.4–6.5)
NEUTROS PCT: 54 %
Platelets: 208 10*3/uL (ref 150–440)
RBC: 4.17 MIL/uL (ref 3.80–5.20)
RDW: 14 % (ref 11.5–14.5)
WBC: 6 10*3/uL (ref 3.6–11.0)

## 2014-10-06 MED ORDER — SODIUM CHLORIDE 0.9 % IV SOLN: 1000.0000 mL | Freq: Once | INTRAVENOUS | Status: DC

## 2014-10-06 MED ORDER — SODIUM CHLORIDE 0.9 % IV SOLN
Freq: Once | INTRAVENOUS | Status: AC
  Administered 2014-10-06: 1000 mL via INTRAVENOUS

## 2014-10-06 MED ORDER — POTASSIUM CHLORIDE CRYS ER 20 MEQ PO TBCR
EXTENDED_RELEASE_TABLET | ORAL | Status: AC
  Administered 2014-10-06: 40 meq via ORAL
  Filled 2014-10-06: qty 1

## 2014-10-06 MED ORDER — POTASSIUM CHLORIDE CRYS ER 20 MEQ PO TBCR
40.0000 meq | EXTENDED_RELEASE_TABLET | Freq: Once | ORAL | Status: AC
  Administered 2014-10-06: 40 meq via ORAL
  Filled 2014-10-06: qty 2

## 2014-10-06 NOTE — Discharge Instructions (Signed)

## 2014-10-06 NOTE — ED Notes (Signed)
Pt presents to ED with c/o weakness and chills since yesterday. Pt reports that she has been receiving treatment (PO antibiotics) for E. Coli since April. Pt reports that she came to ED (per Dr. Sampson Goon) to receive IV antibiotics last week. Pt reports that she had a picc line placed on Friday and has been receiving daily antibiotics since then. Pt reports that the home health nurse came this morning to administer fluids but shortly after her arrival, Dr. Jarrett Ables office called back and told pt to come to ED to "make sure the bacteria didn't reach my blood." Pt is awake and alert with husband in room during triage. Pt presents will chills during triage, no fever at this time.

## 2014-10-06 NOTE — ED Notes (Signed)
Charge RN and MD made aware of pt. Pt placed in ED7.

## 2014-10-06 NOTE — ED Provider Notes (Signed)
[facility] Emergency Department Kalanie Fewell Note     Time seen: ----------------------------------------- 1:41 PM on 10/06/2014 -----------------------------------------    I have reviewed the triage vital signs and the nursing notes.   HISTORY  Chief Complaint Weakness    HPI LORANE COUSAR is a 44 y.o. female who presents to ER for weakness and chills and congestion. Patient states been receiving IV antibiotics for Escherichia coli since April. Patient works came to the ER to receive IV antibiotics last week, she PICC line placed Friday and has been receiving daily antibiotic since then. She's not had her IV antibiotics today, home health nurse reports she looked weaker than normal. Infectious disease advised to come to the ER to make sure infection was not worsening, also concerned she may be dehydrated.   Past Medical History  Diagnosis Date  . Chronic kidney disease     Patient Active Problem List   Diagnosis Date Noted  . SOB (shortness of breath) 11/14/2013  . Palpitations 11/14/2013    Past Surgical History  Procedure Laterality Date  . Appendectomy    . Back surgery    . Cholecystectomy    . Abdominal hysterectomy    . Tonsillectomy Bilateral   . Peripheral vascular catheterization  10/02/2014    Procedure: PICC Line Insertion;  Surgeon: Annice Needy, MD;  Location: [facility] INVASIVE CV LAB;  Service: Cardiovascular;;    Allergies Sulfa antibiotics  Social History Social History  Substance Use Topics  . Smoking status: Never Smoker   . Smokeless tobacco: None  . Alcohol Use: No    Review of Systems Constitutional: Negative for fever, positive for chills Eyes: Negative for visual changes. ENT: Negative for sore throat. Cardiovascular: Negative for chest pain. Respiratory: Negative for shortness of breath. Gastrointestinal: Negative for abdominal pain, vomiting and diarrhea. Genitourinary: Negative for dysuria. Musculoskeletal:  Negative for back pain. Skin: Negative for rash. Neurological: Negative for headaches, positive for weakness  10-point ROS otherwise negative.  ____________________________________________   PHYSICAL EXAM:  VITAL SIGNS: ED Triage Vitals  Enc Vitals Group     BP 10/06/14 1312 146/82 mmHg     Pulse Rate 10/06/14 1312 99     Resp 10/06/14 1312 18     Temp 10/06/14 1312 97.9 F (36.6 C)     Temp Source 10/06/14 1312 Oral     SpO2 10/06/14 1312 100 %     Weight 10/06/14 1312 161 lb (73.029 kg)     Height 10/06/14 1312  (1.727 m)     Head Cir --      Peak Flow --      Pain Score 10/06/14 1313 3     Pain Loc --      Pain Edu? --      Excl. in GC? --     Constitutional: Alert and oriented. Well appearing and in no distress. Eyes: Conjunctivae are normal. PERRL. Normal extraocular movements. ENT   Head: Normocephalic and atraumatic.   Nose: No congestion/rhinnorhea.   Mouth/Throat: Mucous membranes are moist.   Neck: No stridor. Cardiovascular: Normal rate, regular rhythm. Normal and symmetric distal pulses are present in all extremities. No murmurs, rubs, or gallops. Respiratory: Normal respiratory effort without tachypnea nor retractions. Breath sounds are clear and equal bilaterally. No wheezes/rales/rhonchi. Gastrointestinal: Soft and nontender. No distention. No abdominal bruits.  Musculoskeletal: Nontender with normal range of motion in all extremities. No joint effusions.  No lower extremity tenderness nor edema. Right upper extremity PICC line is  unremarkable in appearance Neurologic:  Normal speech and language. No gross focal neurologic deficits are appreciated. Speech is normal. No gait instability. Skin:  Skin is warm, dry and intact. No rash noted. Psychiatric: Mood and affect are normal. Speech and behavior are normal. Patient exhibits appropriate insight and judgment.  ____________________________________________  ED COURSE:  Pertinent labs &  imaging results that were available during my care of the patient were reviewed by me and considered in my medical decision making (see chart for details). We'll check basic labs, recheck her urine and give her IV fluid bolus. ____________________________________________    LABS (pertinent positives/negatives)  Labs Reviewed  CBC WITH DIFFERENTIAL/PLATELET - Abnormal; Notable for the following:    Hemoglobin 11.1 (*)    HCT 32.0 (*)    MCV 76.8 (*)    All other components within normal limits  COMPREHENSIVE METABOLIC PANEL - Abnormal; Notable for the following:    Potassium 3.2 (*)    Total Protein 6.2 (*)    ALT 13 (*)    All other components within normal limits  URINALYSIS COMPLETEWITH MICROSCOPIC ([facility] ONLY) - Abnormal; Notable for the following:    Color, Urine STRAW (*)    APPearance CLEAR (*)    Squamous Epithelial / LPF 0-5 (*)    All other components within normal limits  URINE CULTURE  CULTURE, BLOOD (ROUTINE X 2)  CULTURE, BLOOD (ROUTINE X 2)   ____________________________________________  FINAL ASSESSMENT AND PLAN  Weakness  Plan: Patient with labs and imaging as dictated above. No clear etiology for symptoms is identified. There is really no signs for bacteremia or sepsis. Her urine seems to be improving, she can continue outpatient follow-up with infectious disease, continue infusions as previously directed. She stable for outpatient follow-up   Emily Filbert, MD   Emily Filbert, MD 10/06/14 1536

## 2014-10-08 LAB — URINE CULTURE
CULTURE: NO GROWTH
SPECIAL REQUESTS: NORMAL

## 2014-10-11 LAB — CULTURE, BLOOD (ROUTINE X 2)
CULTURE: NO GROWTH
CULTURE: NO GROWTH
SPECIAL REQUESTS: NORMAL
SPECIAL REQUESTS: NORMAL

## 2014-12-07 ENCOUNTER — Emergency Department
Admission: EM | Admit: 2014-12-07 | Discharge: 2014-12-07 | Disposition: A | Discharge status: Home / Self Care | Payer: BLUE CROSS/BLUE SHIELD | Attending: Emergency Medicine | Admitting: Emergency Medicine

## 2014-12-07 ENCOUNTER — Emergency Department: Payer: BLUE CROSS/BLUE SHIELD

## 2014-12-07 ENCOUNTER — Encounter: Payer: Self-pay | Admitting: Emergency Medicine

## 2014-12-07 DIAGNOSIS — R05 Cough: Secondary | ICD-10-CM | POA: Insufficient documentation

## 2014-12-07 DIAGNOSIS — R079 Chest pain, unspecified: Secondary | ICD-10-CM | POA: Diagnosis present

## 2014-12-07 DIAGNOSIS — R0782 Intercostal pain: Secondary | ICD-10-CM | POA: Diagnosis not present

## 2014-12-07 DIAGNOSIS — R110 Nausea: Secondary | ICD-10-CM | POA: Diagnosis not present

## 2014-12-07 DIAGNOSIS — M791 Myalgia: Secondary | ICD-10-CM | POA: Insufficient documentation

## 2014-12-07 DIAGNOSIS — H9203 Otalgia, bilateral: Secondary | ICD-10-CM | POA: Insufficient documentation

## 2014-12-07 DIAGNOSIS — R6883 Chills (without fever): Secondary | ICD-10-CM | POA: Diagnosis not present

## 2014-12-07 DIAGNOSIS — E876 Hypokalemia: Secondary | ICD-10-CM | POA: Diagnosis not present

## 2014-12-07 DIAGNOSIS — J029 Acute pharyngitis, unspecified: Secondary | ICD-10-CM | POA: Diagnosis not present

## 2014-12-07 DIAGNOSIS — R6889 Other general symptoms and signs: Secondary | ICD-10-CM

## 2014-12-07 LAB — CBC
HEMATOCRIT: 32.9 % — AB (ref 35.0–47.0)
Hemoglobin: 10.8 g/dL — ABNORMAL LOW (ref 12.0–16.0)
MCH: 25.1 pg — AB (ref 26.0–34.0)
MCHC: 32.9 g/dL (ref 32.0–36.0)
MCV: 76.3 fL — AB (ref 80.0–100.0)
Platelets: 163 10*3/uL (ref 150–440)
RBC: 4.31 MIL/uL (ref 3.80–5.20)
RDW: 13.6 % (ref 11.5–14.5)
WBC: 5.1 10*3/uL (ref 3.6–11.0)

## 2014-12-07 LAB — BASIC METABOLIC PANEL
Anion gap: 7 (ref 5–15)
BUN: 10 mg/dL (ref 6–20)
CHLORIDE: 103 mmol/L (ref 101–111)
CO2: 28 mmol/L (ref 22–32)
CREATININE: 0.67 mg/dL (ref 0.44–1.00)
Calcium: 8.9 mg/dL (ref 8.9–10.3)
GFR calc Af Amer: >60 mL/min (ref >60)
GFR calc non Af Amer: >60 mL/min (ref >60)
Glucose, Bld: 98 mg/dL (ref 65–99)
POTASSIUM: 3.2 mmol/L — AB (ref 3.5–5.1)
Sodium: 138 mmol/L (ref 135–145)

## 2014-12-07 LAB — URINALYSIS COMPLETE WITH MICROSCOPIC (ARMC ONLY)
BACTERIA UA: NONE SEEN
Bilirubin Urine: NEGATIVE
GLUCOSE, UA: NEGATIVE mg/dL
HGB URINE DIPSTICK: NEGATIVE
Ketones, ur: NEGATIVE mg/dL
Leukocytes, UA: NEGATIVE
Nitrite: NEGATIVE
PROTEIN: NEGATIVE mg/dL
SPECIFIC GRAVITY, URINE: 1.003 — AB (ref 1.005–1.030)
pH: 8 (ref 5.0–8.0)

## 2014-12-07 MED ORDER — SODIUM CHLORIDE 0.9 % IV BOLUS (SEPSIS)
1000.0000 mL | Freq: Once | INTRAVENOUS | Status: AC
  Administered 2014-12-07: 1000 mL via INTRAVENOUS

## 2014-12-07 MED ORDER — ONDANSETRON HCL 4 MG/2ML IJ SOLN
4.0000 mg | Freq: Once | INTRAMUSCULAR | Status: AC
  Administered 2014-12-07: 4 mg via INTRAVENOUS
  Filled 2014-12-07: qty 2

## 2014-12-07 MED ORDER — ONDANSETRON 4 MG PO TBDP: 4.0000 mg | ORAL_TABLET | Freq: Three times a day (TID) | ORAL | Status: DC | PRN

## 2014-12-07 MED ORDER — IPRATROPIUM-ALBUTEROL 0.5-2.5 (3) MG/3ML IN SOLN
3.0000 mL | Freq: Once | RESPIRATORY_TRACT | Status: AC
  Administered 2014-12-07: 3 mL via RESPIRATORY_TRACT
  Filled 2014-12-07: qty 3

## 2014-12-07 MED ORDER — POTASSIUM CHLORIDE CRYS ER 20 MEQ PO TBCR
40.0000 meq | EXTENDED_RELEASE_TABLET | Freq: Once | ORAL | Status: AC
  Administered 2014-12-07: 40 meq via ORAL
  Filled 2014-12-07: qty 2

## 2014-12-07 MED ORDER — KETOROLAC TROMETHAMINE 10 MG PO TABS: 10.0000 mg | ORAL_TABLET | Freq: Three times a day (TID) | ORAL | Status: DC | PRN

## 2014-12-07 MED ORDER — BENZONATATE 100 MG PO CAPS: 100.0000 mg | ORAL_CAPSULE | Freq: Three times a day (TID) | ORAL | Status: DC | PRN

## 2014-12-07 MED ORDER — BENZONATATE 100 MG PO CAPS
200.0000 mg | ORAL_CAPSULE | Freq: Once | ORAL | Status: AC
  Administered 2014-12-07: 200 mg via ORAL
  Filled 2014-12-07: qty 2

## 2014-12-07 MED ORDER — HYDROMORPHONE HCL 1 MG/ML IJ SOLN
0.5000 mg | Freq: Once | INTRAMUSCULAR | Status: AC
  Administered 2014-12-07: 0.5 mg via INTRAVENOUS
  Filled 2014-12-07: qty 1

## 2014-12-07 MED ORDER — KETOROLAC TROMETHAMINE 30 MG/ML IJ SOLN
30.0000 mg | Freq: Once | INTRAMUSCULAR | Status: AC
  Administered 2014-12-07: 30 mg via INTRAVENOUS
  Filled 2014-12-07: qty 1

## 2014-12-07 MED ORDER — ACETAMINOPHEN 500 MG PO TABS
1000.0000 mg | ORAL_TABLET | Freq: Once | ORAL | Status: AC
Start: 2014-12-07 — End: 2014-12-07
  Administered 2014-12-07: 1000 mg via ORAL
  Filled 2014-12-07: qty 2

## 2014-12-07 NOTE — Discharge Instructions (Signed)
To decrease nausea, he may take Zofran. Please follow-up BRAT diet for the next 12-24 hours and advance to a bland full diet only as tolerated.  Please drink plenty of fluid to stay well hydrated. Please return to the emergency department if you're unable to keep down fluids, for severe pain, for shortness of breath, or for any other symptoms concerning to you.

## 2014-12-07 NOTE — ED Provider Notes (Signed)
[facility] Emergency Department Provider Note  ____________________________________________  Time seen: Approximately 8:37 PM  I have reviewed the triage vital signs and the nursing notes.   HISTORY  Chief Complaint Chest Pain and Cough    HPI Vickie Turner is a 44 y.o. female presenting with chest pain. Patient states that for the past 4 days, she has been having chills, nonproductive cough, bilateral ear pain, and sore throat. She has also been having diffuse myalgias. She denies any fever. Today she has also had intermittent waves of nausea without vomiting. No abdominal pain or diarrhea. She went to the walk-in clinic and was prescribed doxycycline. Today, she was at the store when she had multiple coughing spasms and then developed a severe sharp diffuse bilateral chest pain that did not radiate. The pain is not worse with deep breaths. It occurs with cough and without cough. She has not tried any medication for pain.No flu shot this year.   Past Medical History  Diagnosis Date  . Chronic kidney disease     Patient Active Problem List   Diagnosis Date Noted  . SOB (shortness of breath) 11/14/2013  . Palpitations 11/14/2013    Past Surgical History  Procedure Laterality Date  . Appendectomy    . Back surgery    . Cholecystectomy    . Abdominal hysterectomy    . Tonsillectomy Bilateral   . Peripheral vascular catheterization  10/02/2014    Procedure: PICC Line Insertion;  Surgeon: Annice Needy, MD;  Location: [facility] INVASIVE CV LAB;  Service: Cardiovascular;;    Current Outpatient Rx  Name  Route  Sig  Dispense  Refill  . benzonatate (TESSALON PERLES) 100 MG capsule   Oral   Take 1 capsule (100 mg total) by mouth 3 (three) times daily as needed for cough.   15 capsule   0   . HYDROcodone-acetaminophen (NORCO) 5-325 MG per tablet   Oral   Take 1-2 tablets by mouth every 4 (four) hours as needed for moderate pain.   10 tablet   0   .  ketorolac (TORADOL) 10 MG tablet   Oral   Take 1 tablet (10 mg total) by mouth every 8 (eight) hours as needed for moderate pain (with food).   15 tablet   0   . levocetirizine (XYZAL) 5 MG tablet   Oral   Take 5 mg by mouth as needed.          . ondansetron (ZOFRAN ODT) 4 MG disintegrating tablet   Oral   Take 1 tablet (4 mg total) by mouth every 8 (eight) hours as needed for nausea or vomiting.   20 tablet   0   . PREMARIN 0.3 MG tablet   Oral   Take 0.3 mg by mouth as needed.       0   . zolpidem (AMBIEN) 10 MG tablet   Oral   Take 10 mg by mouth at bedtime as needed for sleep.           Allergies Sulfa antibiotics  Family History  Problem Relation Age of Onset  . Heart Problems Brother     Social History Social History  Substance Use Topics  . Smoking status: Never Smoker   . Smokeless tobacco: None  . Alcohol Use: No    Review of Systems Constitutional: No fever or positive chills. No lightheadedness. No syncope. Eyes: No visual changes. ENT: No sore throat. Cardiovascular: Positive chest pain, no palpitations. Respiratory: Denies  shortness of breath.  Positive cough. Gastrointestinal: No abdominal pain.  Positive nausea, no vomiting.  No diarrhea.  No constipation. Genitourinary: Negative for dysuria. Musculoskeletal: Negative for back pain. Skin: Negative for rash. Neurological: Negative for headaches, focal weakness or numbness.  10-point ROS otherwise negative.  ____________________________________________   PHYSICAL EXAM:  VITAL SIGNS: ED Triage Vitals  Enc Vitals Group     BP 12/07/14 2034 162/96 mmHg     Pulse Rate 12/07/14 2034 74     Resp 12/07/14 2034 16     Temp 12/07/14 2034 98.2 F (36.8 C)     Temp Source 12/07/14 2034 Oral     SpO2 12/07/14 2034 100 %     Weight 12/07/14 2034 169 lb (76.658 kg)     Height 12/07/14 2034  (1.727 m)     Head Cir --      Peak Flow --      Pain Score 12/07/14 2035 10     Pain Loc --       Pain Edu? --      Excl. in GC? --     Constitutional: Alert and oriented. Well appearing and in no acute distress. Answer question appropriately. Eyes: Conjunctivae are normal.  EOMI. no discharge EARS: Bilateral TMs are without bulge or erythema. Right TM has minimal fluid behind it. Bilateral canals are clear. Head: Atraumatic. Nose: No congestion/rhinnorhea. Mouth/Throat: Mucous membranes are moist. Tonsils are absent. No posterior pharyngeal erythema. Palate is symmetric and uvula is midline. Neck: No stridor.  Supple.  No meningismus. Cardiovascular: Normal rate, regular rhythm. No murmurs, rubs or gallops.  Respiratory: Normal respiratory effort.  No retractions. Lungs CTAB.  Minimal end expiratory wheezes. Active coughing that is dry. No rales or rhonchi. Gastrointestinal: Soft and nontender. No distention. No peritoneal signs. Musculoskeletal: No LE edema.  Neurologic:  Normal speech and language. No gross focal neurologic deficits are appreciated.  Skin:  Skin is warm, dry and intact. No rash noted. Psychiatric: Mood and affect are normal. Speech and behavior are normal.  Normal judgement.  ____________________________________________   LABS (all labs ordered are listed, but only abnormal results are displayed)  Labs Reviewed  CBC - Abnormal; Notable for the following:    Hemoglobin 10.8 (*)    HCT 32.9 (*)    MCV 76.3 (*)    MCH 25.1 (*)    All other components within normal limits  BASIC METABOLIC PANEL - Abnormal; Notable for the following:    Potassium 3.2 (*)    All other components within normal limits  URINALYSIS COMPLETEWITH MICROSCOPIC ([facility] ONLY)   ____________________________________________  EKG  ED ECG REPORT I, Rockne Menghini, the attending physician, personally viewed and interpreted this ECG.   Date: 12/07/2014  EKG Time: 2037  Rate: 74  Rhythm: normal sinus rhythm  Axis: Normal  Intervals:none  ST&T Change:Nonspecific T-wave  inversion in V1. No ST elevation.  ____________________________________________  RADIOLOGY  Dg Chest 2 View  12/07/2014  CLINICAL DATA:  Cough and chest pain EXAM: CHEST  2 VIEW COMPARISON:  December 17, 2006 FINDINGS: Lungs are clear. Heart size and pulmonary vascularity are normal. No adenopathy. No pneumothorax. No bone lesions. There are surgical clips in the upper abdomen on the right. IMPRESSION: No edema or consolidation. Electronically Signed   By: Bretta Bang III M.D.   On: 12/07/2014 21:09    ____________________________________________   PROCEDURES  Procedure(s) performed: None  Critical Care performed: No ____________________________________________   INITIAL IMPRESSION / ASSESSMENT AND  PLAN / ED COURSE  Pertinent labs & imaging results that were available during my care of the patient were reviewed by me and considered in my medical decision making (see chart for details).   44 y.o. female who is not otherwise immune compromised presenting with myalgias, cough, bilateral ear pain, and chills. It is likely that the patient has a URI or potentially a flulike illness. Her vital signs are stable and her exam is reassuring. I will plan to give her IV fluids, symptomatic control, and obtain a chest x-ray to rule out pneumonia.  ----------------------------------------- 9:42 PM on 12/07/2014 -----------------------------------------  Patient's lab work is reassuring with a normal white blood cell count. She is mildly hypokalemic so I'll supplement her with potassium. Her chest x-ray does not show any acute infiltrate. It is mostly that the patient has a flulike illness. Her symptoms started to long ago to this warrant the use of Tamiflu. We will continue to manage her symptomatically. At this time, she states that she is not feeling much better than when she arrived. ____________________________________________  FINAL CLINICAL IMPRESSION(S) / ED DIAGNOSES  Final  diagnoses:  Nausea without vomiting  Hypokalemia  Intercostal pain  Flu-like symptoms      NEW MEDICATIONS STARTED DURING THIS VISIT:  New Prescriptions   BENZONATATE (TESSALON PERLES) 100 MG CAPSULE    Take 1 capsule (100 mg total) by mouth 3 (three) times daily as needed for cough.   KETOROLAC (TORADOL) 10 MG TABLET    Take 1 tablet (10 mg total) by mouth every 8 (eight) hours as needed for moderate pain (with food).   ONDANSETRON (ZOFRAN ODT) 4 MG DISINTEGRATING TABLET    Take 1 tablet (4 mg total) by mouth every 8 (eight) hours as needed for nausea or vomiting.     Rockne MenghiniAnne-Caroline Dennise Bamber, MD 12/07/14 2143

## 2014-12-07 NOTE — ED Notes (Signed)
Pt brought in by ems with c/o cough and chest pain. Pt sates she was having chills starting Friday and went to the walk in clinic they gave her antibiotics and pain medication pt states she went to the store today and started coughing since then she has had sever chest pain.

## 2014-12-31 ENCOUNTER — Encounter: Payer: Self-pay | Admitting: Infectious Diseases

## 2015-04-15 ENCOUNTER — Encounter: Payer: Self-pay | Admitting: Emergency Medicine

## 2015-04-15 ENCOUNTER — Emergency Department
Admission: EM | Admit: 2015-04-15 | Discharge: 2015-04-15 | Disposition: A | Discharge status: Home / Self Care | Payer: BLUE CROSS/BLUE SHIELD | Attending: Emergency Medicine | Admitting: Emergency Medicine

## 2015-04-15 DIAGNOSIS — E876 Hypokalemia: Secondary | ICD-10-CM | POA: Diagnosis not present

## 2015-04-15 DIAGNOSIS — N189 Chronic kidney disease, unspecified: Secondary | ICD-10-CM | POA: Insufficient documentation

## 2015-04-15 DIAGNOSIS — R002 Palpitations: Secondary | ICD-10-CM | POA: Insufficient documentation

## 2015-04-15 DIAGNOSIS — R531 Weakness: Secondary | ICD-10-CM | POA: Diagnosis not present

## 2015-04-15 DIAGNOSIS — Z90710 Acquired absence of both cervix and uterus: Secondary | ICD-10-CM | POA: Diagnosis not present

## 2015-04-15 DIAGNOSIS — R4182 Altered mental status, unspecified: Secondary | ICD-10-CM | POA: Diagnosis present

## 2015-04-15 LAB — COMPREHENSIVE METABOLIC PANEL
ALK PHOS: 71 U/L (ref 38–126)
ALT: 13 U/L — ABNORMAL LOW (ref 14–54)
ANION GAP: 4 — AB (ref 5–15)
AST: 21 U/L (ref 15–41)
Albumin: 3.3 g/dL — ABNORMAL LOW (ref 3.5–5.0)
BILIRUBIN TOTAL: 0.4 mg/dL (ref 0.3–1.2)
BUN: 11 mg/dL (ref 6–20)
CALCIUM: 8.3 mg/dL — AB (ref 8.9–10.3)
CO2: 29 mmol/L (ref 22–32)
Chloride: 107 mmol/L (ref 101–111)
Creatinine, Ser: 0.69 mg/dL (ref 0.44–1.00)
GFR calc Af Amer: >60 mL/min (ref >60)
GLUCOSE: 87 mg/dL (ref 65–99)
POTASSIUM: 2.8 mmol/L — AB (ref 3.5–5.1)
Sodium: 140 mmol/L (ref 135–145)
TOTAL PROTEIN: 5.8 g/dL — AB (ref 6.5–8.1)

## 2015-04-15 LAB — URINALYSIS COMPLETE WITH MICROSCOPIC (ARMC ONLY)
BILIRUBIN URINE: NEGATIVE
Bacteria, UA: NONE SEEN
GLUCOSE, UA: NEGATIVE mg/dL
HGB URINE DIPSTICK: NEGATIVE
KETONES UR: NEGATIVE mg/dL
LEUKOCYTES UA: NEGATIVE
Nitrite: NEGATIVE
PH: 6 (ref 5.0–8.0)
Protein, ur: NEGATIVE mg/dL
RBC / HPF: NONE SEEN RBC/hpf (ref 0–5)
SQUAMOUS EPITHELIAL / LPF: NONE SEEN
Specific Gravity, Urine: 1.006 (ref 1.005–1.030)

## 2015-04-15 LAB — CBC
HEMATOCRIT: 28.9 % — AB (ref 35.0–47.0)
HEMOGLOBIN: 9.7 g/dL — AB (ref 12.0–16.0)
MCH: 24.7 pg — AB (ref 26.0–34.0)
MCHC: 33.5 g/dL (ref 32.0–36.0)
MCV: 73.9 fL — AB (ref 80.0–100.0)
Platelets: 199 10*3/uL (ref 150–440)
RBC: 3.91 MIL/uL (ref 3.80–5.20)
RDW: 14.4 % (ref 11.5–14.5)
WBC: 7.2 10*3/uL (ref 3.6–11.0)

## 2015-04-15 LAB — TROPONIN I: Troponin I: <0.03 ng/mL (ref <0.031)

## 2015-04-15 MED ORDER — POTASSIUM CHLORIDE 20 MEQ PO PACK: 20.0000 meq | PACK | Freq: Two times a day (BID) | ORAL | Status: DC

## 2015-04-15 MED ORDER — SODIUM CHLORIDE 0.9 % IV BOLUS (SEPSIS)
1000.0000 mL | Freq: Once | INTRAVENOUS | Status: AC
  Administered 2015-04-15: 1000 mL via INTRAVENOUS

## 2015-04-15 MED ORDER — POTASSIUM CHLORIDE CRYS ER 20 MEQ PO TBCR
40.0000 meq | EXTENDED_RELEASE_TABLET | Freq: Once | ORAL | Status: AC
  Administered 2015-04-15: 40 meq via ORAL
  Filled 2015-04-15: qty 2

## 2015-04-15 NOTE — ED Notes (Signed)
Ems pt from home , lethargic, altered mental status , recently being treated for UTI

## 2015-04-15 NOTE — ED Provider Notes (Signed)
[facility] Emergency Department Provider Note  Time seen: 4:13 PM  I have reviewed the triage vital signs and the nursing notes.   HISTORY  Chief Complaint Altered Mental Status    HPI Vickie Turner is a 45 y.o. female with a past medical history of chronic kidney disease presents the emergency department with lethargy. According to the patient and paramedics the patient is currently on antibiotics for urinary tract infection. Per record review the patient is currently taking ampicillin prescribed by her urologist Dr. Earlene Plateravis. Patient states over the past several days she has become very weak, today was worse so she came to the emergency department for evaluation. Patient states she has been having chills, feeling very weak, denies fever, states nausea but denies vomiting. Describes her generalized weakness as moderate.     Past Medical History  Diagnosis Date  . Chronic kidney disease     Patient Active Problem List   Diagnosis Date Noted  . SOB (shortness of breath) 11/14/2013  . Palpitations 11/14/2013    Past Surgical History  Procedure Laterality Date  . Appendectomy    . Back surgery    . Cholecystectomy    . Abdominal hysterectomy    . Tonsillectomy Bilateral   . Peripheral vascular catheterization  10/02/2014    Procedure: PICC Line Insertion;  Surgeon: Annice NeedyJason S Dew, MD;  Location: [facility] INVASIVE CV LAB;  Service: Cardiovascular;;    Current Outpatient Rx  Name  Route  Sig  Dispense  Refill  . benzonatate (TESSALON PERLES) 100 MG capsule   Oral   Take 1 capsule (100 mg total) by mouth 3 (three) times daily as needed for cough.   15 capsule   0   . HYDROcodone-acetaminophen (NORCO) 5-325 MG per tablet   Oral   Take 1-2 tablets by mouth every 4 (four) hours as needed for moderate pain.   10 tablet   0   . ketorolac (TORADOL) 10 MG tablet   Oral   Take 1 tablet (10 mg total) by mouth every 8 (eight) hours as needed for moderate pain  (with food).   15 tablet   0   . levocetirizine (XYZAL) 5 MG tablet   Oral   Take 5 mg by mouth as needed.          . ondansetron (ZOFRAN ODT) 4 MG disintegrating tablet   Oral   Take 1 tablet (4 mg total) by mouth every 8 (eight) hours as needed for nausea or vomiting.   20 tablet   0   . PREMARIN 0.3 MG tablet   Oral   Take 0.3 mg by mouth as needed.       0   . zolpidem (AMBIEN) 10 MG tablet   Oral   Take 10 mg by mouth at bedtime as needed for sleep.           Allergies Sulfa antibiotics  Family History  Problem Relation Age of Onset  . Heart Problems Brother     Social History Social History  Substance Use Topics  . Smoking status: Never Smoker   . Smokeless tobacco: None  . Alcohol Use: No    Review of Systems Constitutional: Negative for fever. Positive for chills. Cardiovascular: Negative for chest pain. Respiratory: Negative for shortness of breath. Gastrointestinal: Mild lower abdominal pain. Positive for nausea. Negative for vomiting or diarrhea. Genitourinary: Positive for dysuria. Musculoskeletal: Negative for back pain. Neurological: Negative for headache 10-point ROS otherwise negative.  ____________________________________________  PHYSICAL EXAM:  VITAL SIGNS: ED Triage Vitals  Enc Vitals Group     BP 04/15/15 1612 102/86 mmHg     Pulse Rate 04/15/15 1612 64     Resp 04/15/15 1612 14     Temp 04/15/15 1602 98.3 F (36.8 C)     Temp Source 04/15/15 1602 Oral     SpO2 04/15/15 1612 100 %     Weight 04/15/15 1602 167 lb (75.751 kg)     Height 04/15/15 1602  (1.727 m)     Head Cir --      Peak Flow --      Pain Score 04/15/15 1603 0     Pain Loc --      Pain Edu? --      Excl. in GC? --     Constitutional: Alert and oriented. Patient does appear fatigued, is able to answer questions and follow commands appropriately. No acute distress. Eyes: Normal exam ENT   Head: Normocephalic and atraumatic.    Mouth/Throat: Mucous membranes are moist. Cardiovascular: Normal rate, regular rhythm. No murmur Respiratory: Normal respiratory effort without tachypnea nor retractions. Breath sounds are clear  Gastrointestinal: Soft, moderate suprapubic tenderness palpation. No rebound or guarding. Musculoskeletal: Nontender with normal range of motion in all extremities.  Neurologic:  Normal speech and language. No gross focal neurologic deficits Skin:  Skin is warm, dry and intact.  Psychiatric: Mood and affect are normal. Speech and behavior are normal  ____________________________________________   INITIAL IMPRESSION / ASSESSMENT AND PLAN / ED COURSE  Pertinent labs & imaging results that were available during my care of the patient were reviewed by me and considered in my medical decision making (see chart for details).  Patient presents the emergency department with lethargy, currently on ampicillin for urinary tract infection. Patient has a history of chronic kidney disease. Currently patient's vitals are within normal limits, afebrile in the emergency department. We will check labs, IV hydrate, check a urinalysis we will also send blood and urine cultures and closely monitor in the emergency department while awaiting lab results. I was able to review the patient's records, she was prescribed ampicillin by Dr. Earlene Plater her urologist, urine culture shows sensitivity to ampicillin.  Patient's labs are resulted largely within normal limits besides a moderately low potassium level. We will start the patient on potassium supplements. Patient states several days ago she did notice an orange/red color to her stool, rectal exam today shows guaiac negative stool, but it is a yellow color. Patient is much more awake and alert with family here. Patient will follow-up with her urologist, I discussed with the patient increasing by mouth fluids, we will discharge with potassium supplements. I also discussed return  precautions, patient and family are agreeabl to this plan of care e.  ____________________________________________   FINAL CLINICAL IMPRESSION(S) / ED DIAGNOSES  Generalized weakness Hypokalemia  Minna Antis, MD 04/15/15 Ernestina [place]

## 2015-04-15 NOTE — ED Notes (Signed)
MD at bedside. 

## 2015-04-15 NOTE — Discharge Instructions (Signed)
Hypokalemia Hypokalemia means that the amount of potassium in the blood is lower than normal.Potassium is a chemical, called an electrolyte, that helps regulate the amount of fluid in the body. It also stimulates muscle contraction and helps nerves function properly.Most of the body's potassium is inside of cells, and only a very small amount is in the blood. Because the amount in the blood is so small, minor changes can be life-threatening. CAUSES  Antibiotics.  Diarrhea or vomiting.  Using laxatives too much, which can cause diarrhea.  Chronic kidney disease.  Water pills (diuretics).  Eating disorders (bulimia).  Low magnesium level.  Sweating a lot. SIGNS AND SYMPTOMS  Weakness.  Constipation.  Fatigue.  Muscle cramps.  Mental confusion.  Skipped heartbeats or irregular heartbeat (palpitations).  Tingling or numbness. DIAGNOSIS  Your health care provider can diagnose hypokalemia with blood tests. In addition to checking your potassium level, your health care provider may also check other lab tests. TREATMENT Hypokalemia can be treated with potassium supplements taken by mouth or adjustments in your current medicines. If your potassium level is very low, you may need to get potassium through a vein (IV) and be monitored in the hospital. A diet high in potassium is also helpful. Foods high in potassium are:  Nuts, such as peanuts and pistachios.  Seeds, such as sunflower seeds and pumpkin seeds.  Peas, lentils, and lima beans.  Whole grain and bran cereals and breads.  Fresh fruit and vegetables, such as apricots, avocado, bananas, cantaloupe, kiwi, oranges, tomatoes, asparagus, and potatoes.  Orange and tomato juices.  Red meats.  Fruit yogurt. HOME CARE INSTRUCTIONS  Take all medicines as prescribed by your health care provider.  Maintain a healthy diet by including nutritious food, such as fruits, vegetables, nuts, whole grains, and lean meats.  If  you are taking a laxative, be sure to follow the directions on the label. SEEK MEDICAL CARE IF:  Your weakness gets worse.  You feel your heart pounding or racing.  You are vomiting or having diarrhea.  You are diabetic and having trouble keeping your blood glucose in the normal range. SEEK IMMEDIATE MEDICAL CARE IF:  You have chest pain, shortness of breath, or dizziness.  You are vomiting or having diarrhea for more than 2 days.  You faint. MAKE SURE YOU:   Understand these instructions.  Will watch your condition.  Will get help right away if you are not doing well or get worse.   This information is not intended to replace advice given to you by your health care provider. Make sure you discuss any questions you have with your health care provider.   Document Released: 12/26/2004 Document Revised: 01/16/2014 Document Reviewed: 06/28/2012 Elsevier Interactive Patient Education 2016 Else[employer] Inc[employer]  Weakness Weakness is a lack of strength. You may feel weak all over your body or just in one part of your body. Weakness can be serious. In some cases, you may need more medical tests. HOME CARE  Rest.  Eat a well-balanced diet.  Try to exercise every day.  Only take medicines as told by your doctor. GET HELP RIGHT AWAY IF:   You cannot do your normal daily activities.  You cannot walk up and down stairs, or you feel very tired when you do so.  You have shortness of breath or chest pain.  You have trouble moving parts of your body.  You have weakness in only one body part or on only one side of the body.  You  have a fever.  You have trouble speaking or swallowing.  You cannot control when you pee (urinate) or poop (bowel movement).  You have black or bloody throw up (vomit) or poop.  Your weakness gets worse or spreads to other body parts.  You have new aches or pains. MAKE SURE YOU:   Understand these instructions.  Will watch your condition.  Will  get help right away if you are not doing well or get worse.   This information is not intended to replace advice given to you by your health care provider. Make sure you discuss any questions you have with your health care provider.   Document Released: 12/09/2007 Document Revised: 06/27/2011 Document Reviewed: 02/24/2011 Elsevier Interactive Patient Education [employer] Inc[employer]

## 2015-04-17 LAB — URINE CULTURE: CULTURE: NO GROWTH

## 2015-04-22 LAB — CULTURE, BLOOD (ROUTINE X 2): CULTURE: NO GROWTH

## 2015-05-14 ENCOUNTER — Encounter: Payer: Self-pay | Admitting: Emergency Medicine

## 2015-05-14 DIAGNOSIS — N390 Urinary tract infection, site not specified: Secondary | ICD-10-CM | POA: Diagnosis present

## 2015-05-14 DIAGNOSIS — R109 Unspecified abdominal pain: Secondary | ICD-10-CM | POA: Diagnosis not present

## 2015-05-14 DIAGNOSIS — N189 Chronic kidney disease, unspecified: Secondary | ICD-10-CM | POA: Insufficient documentation

## 2015-05-14 LAB — URINALYSIS COMPLETE WITH MICROSCOPIC (ARMC ONLY)
BILIRUBIN URINE: NEGATIVE
GLUCOSE, UA: NEGATIVE mg/dL
NITRITE: NEGATIVE
Protein, ur: 100 mg/dL — AB
SPECIFIC GRAVITY, URINE: 1.015 (ref 1.005–1.030)
pH: 6 (ref 5.0–8.0)

## 2015-05-14 MED ORDER — OXYCODONE-ACETAMINOPHEN 5-325 MG PO TABS
ORAL_TABLET | ORAL | Status: AC
  Administered 2015-05-14: 1 via ORAL
  Filled 2015-05-14: qty 1

## 2015-05-14 MED ORDER — OXYCODONE-ACETAMINOPHEN 5-325 MG PO TABS
1.0000 | ORAL_TABLET | ORAL | Status: AC | PRN
  Administered 2015-05-14 – 2015-05-15 (×2): 1 via ORAL
  Filled 2015-05-14: qty 1

## 2015-05-14 NOTE — ED Notes (Signed)
Patient reports that she developed pain with urination around 14:00 today. Patient also reports pain to bilateral lower back. Patient states that she has frequent UTIs and just finished antibiotics for a UTI about 6 days ago.

## 2015-05-15 ENCOUNTER — Emergency Department: Payer: BLUE CROSS/BLUE SHIELD

## 2015-05-15 ENCOUNTER — Emergency Department
Admission: EM | Admit: 2015-05-15 | Discharge: 2015-05-15 | Disposition: A | Discharge status: Home / Self Care | Payer: BLUE CROSS/BLUE SHIELD | Attending: Emergency Medicine | Admitting: Emergency Medicine

## 2015-05-15 DIAGNOSIS — R109 Unspecified abdominal pain: Secondary | ICD-10-CM

## 2015-05-15 HISTORY — DX: Obstructive and reflux uropathy, unspecified: N13.9

## 2015-05-15 LAB — CBC
HEMATOCRIT: 31.3 % — AB (ref 35.0–47.0)
HEMOGLOBIN: 10.4 g/dL — AB (ref 12.0–16.0)
MCH: 24.5 pg — AB (ref 26.0–34.0)
MCHC: 33.1 g/dL (ref 32.0–36.0)
MCV: 74.1 fL — AB (ref 80.0–100.0)
Platelets: 191 10*3/uL (ref 150–440)
RBC: 4.22 MIL/uL (ref 3.80–5.20)
RDW: 15 % — ABNORMAL HIGH (ref 11.5–14.5)
WBC: 8 10*3/uL (ref 3.6–11.0)

## 2015-05-15 LAB — BASIC METABOLIC PANEL
ANION GAP: 9 (ref 5–15)
BUN: 15 mg/dL (ref 6–20)
CHLORIDE: 103 mmol/L (ref 101–111)
CO2: 26 mmol/L (ref 22–32)
Calcium: 8.9 mg/dL (ref 8.9–10.3)
Creatinine, Ser: 0.75 mg/dL (ref 0.44–1.00)
GFR calc Af Amer: >60 mL/min (ref >60)
Glucose, Bld: 95 mg/dL (ref 65–99)
POTASSIUM: 2.9 mmol/L — AB (ref 3.5–5.1)
Sodium: 138 mmol/L (ref 135–145)

## 2015-05-15 MED ORDER — ONDANSETRON HCL 4 MG PO TABS: 4.0000 mg | ORAL_TABLET | Freq: Three times a day (TID) | ORAL | Status: DC | PRN

## 2015-05-15 MED ORDER — SODIUM CHLORIDE 0.9 % IV BOLUS (SEPSIS)
500.0000 mL | Freq: Once | INTRAVENOUS | Status: AC
  Administered 2015-05-15: 500 mL via INTRAVENOUS

## 2015-05-15 MED ORDER — POTASSIUM CHLORIDE ER 20 MEQ PO TBCR: 10.0000 meq | EXTENDED_RELEASE_TABLET | Freq: Every day | ORAL | Status: DC

## 2015-05-15 MED ORDER — NITROFURANTOIN MACROCRYSTAL 100 MG PO CAPS: 100.0000 mg | ORAL_CAPSULE | Freq: Four times a day (QID) | ORAL | Status: DC

## 2015-05-15 MED ORDER — KETOROLAC TROMETHAMINE 30 MG/ML IJ SOLN
15.0000 mg | Freq: Once | INTRAMUSCULAR | Status: AC
  Administered 2015-05-15: 15 mg via INTRAVENOUS
  Filled 2015-05-15: qty 1

## 2015-05-15 MED ORDER — DEXTROSE 5 % IV SOLN
1.0000 g | Freq: Once | INTRAVENOUS | Status: AC
  Administered 2015-05-15: 1 g via INTRAVENOUS
  Filled 2015-05-15: qty 10

## 2015-05-15 MED ORDER — POTASSIUM CHLORIDE CRYS ER 20 MEQ PO TBCR
20.0000 meq | EXTENDED_RELEASE_TABLET | Freq: Once | ORAL | Status: AC
  Administered 2015-05-15: 20 meq via ORAL
  Filled 2015-05-15: qty 1

## 2015-05-15 MED ORDER — ONDANSETRON HCL 4 MG/2ML IJ SOLN
4.0000 mg | Freq: Once | INTRAMUSCULAR | Status: AC
  Administered 2015-05-15: 4 mg via INTRAVENOUS
  Filled 2015-05-15: qty 2

## 2015-05-15 NOTE — ED Notes (Signed)
Pt taken to CT prior to hanging IV antibiotic and fluids - they will be started when she returns from CT

## 2015-05-15 NOTE — ED Notes (Signed)
Reported critical K+ of 2.9 to Dr Huel CoteQuigley

## 2015-05-15 NOTE — ED Provider Notes (Signed)
Time Seen: Approximately 0 140 I have reviewed the triage notes  Chief Complaint: Urinary Tract Infection   History of Present Illness: Vickie Turner is a 45 y.o. female who has a history of recurrent urinary tract infections. Patient noticed some afternoon approximately 2 PM that she started developing increasing right-sided pain with urination. Patient had some nausea with no persistent vomiting. She states she just finished a course of antibiotics 6 days ago. Patient denies any left-sided flank or abdominal pain. Patient's not aware of any fever at home and did not take any medication prior to arrival for discomfort. He is a history of "" kidney disease "" which seems to be some cystic lesions noticed on her left kidney on further review of her record. She denies any hematuria Past Medical History  Diagnosis Date  . Chronic kidney disease   . Urinary (tract) obstruction     Patient Active Problem List   Diagnosis Date Noted  . SOB (shortness of breath) 11/14/2013  . Palpitations 11/14/2013    Past Surgical History  Procedure Laterality Date  . Appendectomy    . Back surgery    . Cholecystectomy    . Abdominal hysterectomy    . Tonsillectomy Bilateral   . Peripheral vascular catheterization  10/02/2014    Procedure: PICC Line Insertion;  Surgeon: Annice Needy, MD;  Location: [facility] INVASIVE CV LAB;  Service: Cardiovascular;;    Past Surgical History  Procedure Laterality Date  . Appendectomy    . Back surgery    . Cholecystectomy    . Abdominal hysterectomy    . Tonsillectomy Bilateral   . Peripheral vascular catheterization  10/02/2014    Procedure: PICC Line Insertion;  Surgeon: Annice Needy, MD;  Location: [facility] INVASIVE CV LAB;  Service: Cardiovascular;;    Current Outpatient Rx  Name  Route  Sig  Dispense  Refill  . nitrofurantoin (MACRODANTIN) 100 MG capsule   Oral   Take 1 capsule (100 mg total) by mouth 4 (four) times daily.   28 capsule   0   . ondansetron  (ZOFRAN) 4 MG tablet   Oral   Take 1 tablet (4 mg total) by mouth every 8 (eight) hours as needed for nausea or vomiting.   21 tablet   0   . potassium chloride 20 MEQ TBCR   Oral   Take 10 mEq by mouth daily.   8 tablet   0     Allergies:  Other; Sulfa antibiotics; and Tape  Family History: Family History  Problem Relation Age of Onset  . Heart Problems Brother     Social History: Social History  Substance Use Topics  . Smoking status: Never Smoker   . Smokeless tobacco: None  . Alcohol Use: No     Review of Systems:   10 point review of systems was performed and was otherwise negative:  Constitutional: No fever Eyes: No visual disturbances ENT: No sore throat, ear pain Cardiac: No chest pain Respiratory: No shortness of breath, wheezing, or stridor Abdomen: No abdominal pain, patient has nausea without vomiting. Endocrine: No weight loss, No night sweats Extremities: No peripheral edema, cyanosis Skin: No rashes, easy bruising Neurologic: No focal weakness, trouble with speech or swollowing Urologic: No dysuria, Hematuria, or urinary frequency   Physical Exam:  ED Triage Vitals  Enc Vitals Group     BP 05/14/15 2329 129/72 mmHg     Pulse Rate 05/14/15 2329 71     Resp 05/14/15  2329 18     Temp 05/14/15 2329 98 F (36.7 C)     Temp src --      SpO2 05/14/15 2329 100 %     Weight 05/14/15 2329 151 lb (68.493 kg)     Height 05/14/15 2329 5\' 8"  (1.727 m)     Head Cir --      Peak Flow --      Pain Score 05/14/15 2330 10     Pain Loc --      Pain Edu? --      Excl. in GC? --     General: Awake , Alert , and Oriented times 3; GCS 15;Very anxious Head: Normal cephalic , atraumatic Eyes: Pupils equal , round, reactive to light Nose/Throat: No nasal drainage, patent upper airway without erythema or exudate.  Neck: Supple, Full range of motion, No anterior adenopathy or palpable thyroid masses Lungs: Clear to ascultation without wheezes , rhonchi, or  rales Heart: Regular rate, regular rhythm without murmurs , gallops , or rubs Abdomen: Soft, non tender without rebound, guarding , or rigidity; bowel sounds positive and symmetric in all 4 quadrants. No organomegaly .        Extremities: 2 plus symmetric pulses. No edema, clubbing or cyanosis Neurologic: normal ambulation, Motor symmetric without deficits, sensory intact Skin: warm, dry, no rashes   Labs:   All laboratory work was reviewed including any pertinent negatives or positives listed below:  Labs Reviewed  URINALYSIS COMPLETEWITH MICROSCOPIC ([facility] ONLY) - Abnormal; Notable for the following:    Color, Urine YELLOW (*)    APPearance TURBID (*)    Ketones, ur 1+ (*)    Hgb urine dipstick 3+ (*)    Protein, ur 100 (*)    Leukocytes, UA 3+ (*)    Bacteria, UA RARE (*)    Squamous Epithelial / LPF 0-5 (*)    All other components within normal limits  BASIC METABOLIC PANEL - Abnormal; Notable for the following:    Potassium 2.9 (*)    All other components within normal limits  CBC - Abnormal; Notable for the following:    Hemoglobin 10.4 (*)    HCT 31.3 (*)    MCV 74.1 (*)    MCH 24.5 (*)    RDW 15.0 (*)    All other components within normal limits  URINE CULTURE  Review of laboratory work shows a urinary tract infection. Urine culture is pending Patient has some persistent mild hypokalemia  Radiology:   EXAM: CT ABDOMEN AND PELVIS WITHOUT CONTRAST  TECHNIQUE: Multidetector CT imaging of the abdomen and pelvis was performed following the standard protocol without IV contrast.  COMPARISON: 02/27/2007  FINDINGS: Lung bases are clear.  Kidneys are symmetrical in size and shape. No hydronephrosis or hydroureter. 1.6 cm diameter mass arising from the lower pole of the left kidney. This has enlarged since previous study. This lesion was included within the field of view of an MRI lumbar spine from 03/24/2010. On that study, the lesion demonstrates cystic  features with a fluid fluid level suggesting a hemorrhagic cyst.  The unenhanced appearance of the liver, spleen, pancreas, adrenal glands, abdominal aorta, inferior vena cava, and retroperitoneal lymph nodes is unremarkable. Surgical absence of the gallbladder. No bile duct dilatation. Postoperative changes in the stomach consistent with gastric bypass procedure. Stomach, small bowel, and colon are not abnormally distended. Scattered stool in the colon.  Pelvis: Appendix is surgically absent. Surgical clips in the right lower quadrant. Uterus is  surgically absent. No pelvic mass or lymphadenopathy. Bladder wall is not thickened. No free or loculated pelvic fluid collections. No destructive bone lesions.  IMPRESSION: No renal or ureteral stone or obstruction. No acute process demonstrated in the abdomen or pelvis.    I personally reviewed the radiologic studies    ED Course:   Reexamination shows decreased discomfort and nausea  Review of patient's laboratory work showed red blood cells along with too numerous to count white blood cells. There is rare bacteria present at this time. Abdominal CT was performed to rule out renal colic and associated urinary tract infection with possible kidney stone. CAT scan is essentially negative. She does have persistent cystic lesion noticed on the left kidney. No hydronephrosis or evidence of pyelonephritis stated by the radiologist. The patient received IV Rocephin here in emergency department does have a urine culture pending. She started taking nitroglycerin for auditory and prior to arrival and advised her to continue with that on an outpatient basis and I wrote a prescription to take it 4 times a day. She was advised drink plenty of fluids. She can take over-the-counter ibuprofen and Tylenol for pain and was given a prescription for Zofran for nausea  Assessment: Acute urinary tract infection Hemorrhagic cystitis  Final Clinical  Impression:  Final diagnoses:  Right flank pain     Plan:  Outpatient management Patient was advised to return immediately if condition worsens. Patient was advised to follow up with their primary care physician or other specialized physicians involved in their outpatient care. The patient and/or family member/power of attorney had laboratory results reviewed at the bedside. All questions and concerns were addressed and appropriate discharge instructions were distributed by the nursing staff.            Jennye Moccasin, MD 05/15/15 [phone]

## 2015-05-15 NOTE — ED Notes (Signed)
Patient transported to CT 

## 2015-05-15 NOTE — ED Notes (Signed)

## 2015-05-15 NOTE — ED Notes (Signed)
Pt is having severe pain in lower back radiating into right side - pt reports the pain started at 2pm today - difficulty voiding with pain and burning upon urination - pt denies any other symptoms

## 2015-05-17 LAB — URINE CULTURE: Culture: NO GROWTH

## 2015-05-26 ENCOUNTER — Other Ambulatory Visit
Admission: RE | Admit: 2015-05-26 | Discharge: 2015-05-26 | Disposition: A | Discharge status: Home / Self Care | Payer: BLUE CROSS/BLUE SHIELD | Source: Ambulatory Visit | Attending: Infectious Diseases | Admitting: Infectious Diseases

## 2015-05-26 DIAGNOSIS — N10 Acute tubulo-interstitial nephritis: Secondary | ICD-10-CM | POA: Insufficient documentation

## 2015-05-31 LAB — CULTURE, BLOOD (SINGLE): Culture: NO GROWTH

## 2015-08-09 ENCOUNTER — Inpatient Hospital Stay: Payer: BLUE CROSS/BLUE SHIELD | Attending: Oncology | Admitting: Oncology

## 2015-08-09 ENCOUNTER — Inpatient Hospital Stay: Payer: BLUE CROSS/BLUE SHIELD

## 2015-08-09 ENCOUNTER — Encounter: Payer: Self-pay | Admitting: Oncology

## 2015-08-09 VITALS — BP 107/69 | HR 76 | Temp 97.2°F | Resp 17 | Ht 68.0 in | Wt 164.4 lb

## 2015-08-09 DIAGNOSIS — R233 Spontaneous ecchymoses: Secondary | ICD-10-CM

## 2015-08-09 DIAGNOSIS — D649 Anemia, unspecified: Secondary | ICD-10-CM

## 2015-08-09 DIAGNOSIS — D509 Iron deficiency anemia, unspecified: Secondary | ICD-10-CM | POA: Diagnosis not present

## 2015-08-09 DIAGNOSIS — R531 Weakness: Secondary | ICD-10-CM

## 2015-08-09 DIAGNOSIS — R319 Hematuria, unspecified: Secondary | ICD-10-CM | POA: Insufficient documentation

## 2015-08-09 DIAGNOSIS — R5381 Other malaise: Secondary | ICD-10-CM

## 2015-08-09 DIAGNOSIS — R238 Other skin changes: Secondary | ICD-10-CM

## 2015-08-09 DIAGNOSIS — Z87440 Personal history of urinary (tract) infections: Secondary | ICD-10-CM | POA: Diagnosis not present

## 2015-08-09 DIAGNOSIS — Z79899 Other long term (current) drug therapy: Secondary | ICD-10-CM

## 2015-08-09 DIAGNOSIS — R5383 Other fatigue: Secondary | ICD-10-CM | POA: Insufficient documentation

## 2015-08-09 DIAGNOSIS — R600 Localized edema: Secondary | ICD-10-CM | POA: Insufficient documentation

## 2015-08-09 LAB — CBC
HEMATOCRIT: 28.7 % — AB (ref 35.0–47.0)
Hemoglobin: 9.7 g/dL — ABNORMAL LOW (ref 12.0–16.0)
MCH: 25 pg — ABNORMAL LOW (ref 26.0–34.0)
MCHC: 33.8 g/dL (ref 32.0–36.0)
MCV: 74 fL — ABNORMAL LOW (ref 80.0–100.0)
Platelets: 185 10*3/uL (ref 150–440)
RBC: 3.88 MIL/uL (ref 3.80–5.20)
RDW: 15.8 % — AB (ref 11.5–14.5)
WBC: 6.4 10*3/uL (ref 3.6–11.0)

## 2015-08-09 LAB — IRON AND TIBC
IRON: 41 ug/dL (ref 28–170)
SATURATION RATIOS: 8 % — AB (ref 10.4–31.8)
TIBC: 497 ug/dL — AB (ref 250–450)
UIBC: 456 ug/dL

## 2015-08-09 LAB — PLATELET FUNCTION ASSAY
COLLAGEN / ADP: 96 s (ref 0–118)
Collagen / Epinephrine: 103 seconds (ref 0–193)

## 2015-08-09 LAB — FOLATE: Folate: 26 ng/mL (ref >5.9)

## 2015-08-09 LAB — PROTIME-INR
INR: 1.01
Prothrombin Time: 13.3 seconds (ref 11.4–15.2)

## 2015-08-09 LAB — RETICULOCYTES
RBC.: 3.88 MIL/uL (ref 3.80–5.20)
Retic Count, Absolute: 54.3 10*3/uL (ref 19.0–183.0)
Retic Ct Pct: 1.4 % (ref 0.4–3.1)

## 2015-08-09 LAB — APTT: APTT: 25 s (ref 24–36)

## 2015-08-09 LAB — DAT, POLYSPECIFIC AHG (ARMC ONLY): POLYSPECIFIC AHG TEST: NEGATIVE

## 2015-08-09 LAB — FERRITIN: Ferritin: 6 ng/mL — ABNORMAL LOW (ref 11–307)

## 2015-08-09 LAB — VITAMIN B12: Vitamin B-12: 477 pg/mL (ref 180–914)

## 2015-08-09 LAB — LACTATE DEHYDROGENASE: LDH: 165 U/L (ref 98–192)

## 2015-08-09 NOTE — Progress Notes (Signed)
Reports easily bruising.  Had kidney surgery last year for obstruction on right kidney.  Reports fatigue that can stop her day goes from moderate to extreme.  Since surgery has a lot of UTI.  Pt reports pitting edema pedal and both legs.

## 2015-08-09 NOTE — Progress Notes (Signed)
[facility]  Telephone:([phone] [phone]) [phone]  ID: Vickie Turner OB: 1970/07/08  MR#: [id number]  GXQ#:[id number]  Patient Care Team: No Pcp Per Patient as PCP - General (General Practice)  CHIEF COMPLAINT: Anemia, easy bruising.  INTERVAL HISTORY: Patient is a 45 year old female who was noted to have a persistent anemia on routine blood work. She also complains of easy bruising of her arms and legs over the past several months. She has no evidence of other bleeding. She reports repeated kidney infections and on chronic antibiotics. She otherwise feels well. She has no neurologic complaints. She denies any recent fevers. She has no chest pain, shortness breath, cough, or hemoptysis. She has good appetite and denies weight loss. She has no nausea, vomiting, constipation, or diarrhea. She has no melena or hematochezia. She offers no further specific complaints today.  REVIEW OF SYSTEMS:   Review of Systems  Constitutional: Positive for malaise/fatigue. Negative for fever and weight loss.  Respiratory: Negative.  Negative for cough and shortness of breath.   Cardiovascular: Negative.  Negative for chest pain.  Gastrointestinal: Negative.  Negative for abdominal pain, blood in stool and melena.  Genitourinary: Positive for hematuria.  Musculoskeletal: Negative.   Neurological: Positive for weakness.  Endo/Heme/Allergies: Bruises/bleeds easily.  Psychiatric/Behavioral: Negative.     As per HPI. Otherwise, a complete review of systems is negatve.  PAST MEDICAL HISTORY: Past Medical History:  Diagnosis Date  . Chronic kidney disease   . Urinary (tract) obstruction     PAST SURGICAL HISTORY: Past Surgical History:  Procedure Laterality Date  . ABDOMINAL HYSTERECTOMY    . APPENDECTOMY    . BACK SURGERY    . CHOLECYSTECTOMY    . PERIPHERAL VASCULAR CATHETERIZATION  10/02/2014   Procedure: PICC Line Insertion;  Surgeon: Annice Needy, MD;  Location: [facility]  INVASIVE CV LAB;  Service: Cardiovascular;;  . TONSILLECTOMY Bilateral     FAMILY HISTORY: Family History  Problem Relation Age of Onset  . Heart Problems Brother        ADVANCED DIRECTIVES:    HEALTH MAINTENANCE: Social History  Substance Use Topics  . Smoking status: Never Smoker  . Smokeless tobacco: Never Used  . Alcohol use No     Colonoscopy:  PAP:  Bone density:  Lipid panel:  Allergies  Allergen Reactions  . Other     Pork- religion contradiction    . Sulfa Antibiotics   . Tape     Other reaction(s): Other (See Comments) PLASTIC MEDICAL TAPE, Unknown Kind of Tape    Current Outpatient Prescriptions  Medication Sig Dispense Refill  . cyanocobalamin (,VITAMIN B-12,) 1000 MCG/ML injection INJECT 1 ML WEEKLY FOR 3 WEEKS AS DIRECTED AND THEN ONCE A MONTH FOR ANEMIA    . nitrofurantoin (MACRODANTIN) 100 MG capsule Take 1 capsule (100 mg total) by mouth 4 (four) times daily. 28 capsule 0  . omeprazole (PRILOSEC) 40 MG capsule     . celecoxib (CELEBREX) 200 MG capsule     . ondansetron (ZOFRAN) 4 MG tablet Take 1 tablet (4 mg total) by mouth every 8 (eight) hours as needed for nausea or vomiting. (Patient not taking: Reported on 08/09/2015) 21 tablet 0  . potassium chloride 20 MEQ TBCR Take 10 mEq by mouth daily. 8 tablet 0  . potassium chloride SA (K-DUR,KLOR-CON) 20 MEQ tablet      No current facility-administered medications for this visit.     OBJECTIVE: Vitals:   08/09/15 1350 08/09/15 1353  BP: 91/66  107/69  Pulse: 71 76  Resp: 17   Temp: 97.2 F (36.2 C)      Body mass index is 24.99 kg/m.    ECOG FS:0 - Asymptomatic  General: Well-developed, well-nourished, no acute distress. Eyes: Pink conjunctiva, anicteric sclera. HEENT: Normocephalic, moist mucous membranes, clear oropharnyx. Lungs: Clear to auscultation bilaterally. Heart: Regular rate and rhythm. No rubs, murmurs, or gallops. Abdomen: Soft, nontender, nondistended. No organomegaly noted,  normoactive bowel sounds. Musculoskeletal: No edema, cyanosis, or clubbing. Neuro: Alert, answering all questions appropriately. Cranial nerves grossly intact. Skin: No rashes or petechiae noted. Ecchymosis in various stages of healing on arms and legs. Psych: Normal affect. Lymphatics: No cervical, calvicular, axillary or inguinal LAD.   LAB RESULTS:  Lab Results  Component Value Date   NA 138 05/15/2015   K 2.9 (LL) 05/15/2015   CL 103 05/15/2015   CO2 26 05/15/2015   GLUCOSE 95 05/15/2015   BUN 15 05/15/2015   CREATININE 0.75 05/15/2015   CALCIUM 8.9 05/15/2015   PROT 5.8 (L) 04/15/2015   ALBUMIN 3.3 (L) 04/15/2015   AST 21 04/15/2015   ALT 13 (L) 04/15/2015   ALKPHOS 71 04/15/2015   BILITOT 0.4 04/15/2015   GFRNONAA >60 05/15/2015   GFRAA >60 05/15/2015    Lab Results  Component Value Date   WBC 6.4 08/09/2015   NEUTROABS 3.2 10/06/2014   HGB 9.7 (L) 08/09/2015   HCT 28.7 (L) 08/09/2015   MCV 74.0 (L) 08/09/2015   PLT 185 08/09/2015   Lab Results  Component Value Date   IRON 41 08/09/2015   TIBC 497 (H) 08/09/2015   IRONPCTSAT 8 (L) 08/09/2015   Lab Results  Component Value Date   FERRITIN 6 (L) 08/09/2015     STUDIES: No results found.  ASSESSMENT: Iron deficiency anemia, easy bruising.  PLAN:    1. Iron deficiency anemia: Patient's hemoglobin has slightly trended down and she has decreased iron stores. Her reticulocyte count is inappropriately low. She has no evidence of hemolysis. B-12, folate, ANA, erythropoietin, and a hemoglobinopathy panel are pending at time of dictation. Patient will benefit from IV iron in the future. Return to clinic in 3 weeks for further evaluation, repeat laboratory work, and initiation of Feraheme. 2. Easy bruising: Patient's platelet count, PTT, and PT are all within normal limits. Platelet function assay and von Willebrand's panel are pending at time of dictation. No intervention is needed at this time. Follow-up in 4  weeks as above.  Patient expressed understanding and was in agreement with this plan. She also understands that She can call clinic at any time with any questions, concerns, or complaints.     [name], MD   08/09/2015 8:19 PM

## 2015-08-10 LAB — HEMOGLOBINOPATHY EVALUATION
HGB C: 0 %
HGB F QUANT: 0 % (ref 0.0–2.0)
HGB S QUANTITAION: 0 %
Hgb A2 Quant: 2.4 % (ref 0.7–3.1)
Hgb A: 97.6 % (ref 94.0–98.0)

## 2015-08-10 LAB — HAPTOGLOBIN: HAPTOGLOBIN: 119 mg/dL (ref 34–200)

## 2015-08-10 LAB — COAG STUDIES INTERP REPORT: PDF Image: 0

## 2015-08-10 LAB — ANA W/REFLEX: Anti Nuclear Antibody(ANA): NEGATIVE

## 2015-08-10 LAB — VON WILLEBRAND PANEL
COAGULATION FACTOR VIII: 260 % — AB (ref 57–163)
Ristocetin Co-factor, Plasma: 201 % — ABNORMAL HIGH (ref 50–200)
VON WILLEBRAND ANTIGEN, PLASMA: 210 % — AB (ref 50–200)

## 2015-08-10 LAB — ERYTHROPOIETIN: Erythropoietin: 35.9 m[IU]/mL — ABNORMAL HIGH (ref 2.6–18.5)

## 2015-08-12 ENCOUNTER — Encounter: Payer: Self-pay | Admitting: Emergency Medicine

## 2015-08-12 ENCOUNTER — Emergency Department
Admission: EM | Admit: 2015-08-12 | Discharge: 2015-08-12 | Disposition: A | Discharge status: Home / Self Care | Payer: BLUE CROSS/BLUE SHIELD | Attending: Emergency Medicine | Admitting: Emergency Medicine

## 2015-08-12 ENCOUNTER — Emergency Department: Payer: BLUE CROSS/BLUE SHIELD

## 2015-08-12 DIAGNOSIS — N189 Chronic kidney disease, unspecified: Secondary | ICD-10-CM | POA: Insufficient documentation

## 2015-08-12 DIAGNOSIS — G8929 Other chronic pain: Secondary | ICD-10-CM | POA: Insufficient documentation

## 2015-08-12 DIAGNOSIS — R1031 Right lower quadrant pain: Secondary | ICD-10-CM | POA: Insufficient documentation

## 2015-08-12 DIAGNOSIS — R109 Unspecified abdominal pain: Secondary | ICD-10-CM

## 2015-08-12 LAB — URINALYSIS COMPLETE WITH MICROSCOPIC (ARMC ONLY)
BILIRUBIN URINE: NEGATIVE
Bacteria, UA: NONE SEEN
GLUCOSE, UA: NEGATIVE mg/dL
HGB URINE DIPSTICK: NEGATIVE
KETONES UR: NEGATIVE mg/dL
LEUKOCYTES UA: NEGATIVE
NITRITE: NEGATIVE
PH: 5 (ref 5.0–8.0)
Protein, ur: NEGATIVE mg/dL
SPECIFIC GRAVITY, URINE: 1.013 (ref 1.005–1.030)
SQUAMOUS EPITHELIAL / LPF: NONE SEEN

## 2015-08-12 LAB — BASIC METABOLIC PANEL
Anion gap: 7 (ref 5–15)
BUN: 17 mg/dL (ref 6–20)
CALCIUM: 8.6 mg/dL — AB (ref 8.9–10.3)
CO2: 28 mmol/L (ref 22–32)
CREATININE: 0.59 mg/dL (ref 0.44–1.00)
Chloride: 103 mmol/L (ref 101–111)
GFR calc Af Amer: >60 mL/min (ref >60)
GLUCOSE: 92 mg/dL (ref 65–99)
POTASSIUM: 2.9 mmol/L — AB (ref 3.5–5.1)
SODIUM: 138 mmol/L (ref 135–145)

## 2015-08-12 LAB — CBC
HCT: 31.1 % — ABNORMAL LOW (ref 35.0–47.0)
Hemoglobin: 10.4 g/dL — ABNORMAL LOW (ref 12.0–16.0)
MCH: 25.1 pg — AB (ref 26.0–34.0)
MCHC: 33.5 g/dL (ref 32.0–36.0)
MCV: 74.7 fL — AB (ref 80.0–100.0)
PLATELETS: 214 10*3/uL (ref 150–440)
RBC: 4.16 MIL/uL (ref 3.80–5.20)
RDW: 16.1 % — AB (ref 11.5–14.5)
WBC: 6.8 10*3/uL (ref 3.6–11.0)

## 2015-08-12 MED ORDER — SODIUM CHLORIDE 0.9 % IV BOLUS (SEPSIS)
1000.0000 mL | Freq: Once | INTRAVENOUS | Status: AC
  Administered 2015-08-12: 1000 mL via INTRAVENOUS

## 2015-08-12 MED ORDER — FENTANYL CITRATE (PF) 100 MCG/2ML IJ SOLN
50.0000 ug | Freq: Once | INTRAMUSCULAR | Status: AC
  Administered 2015-08-12: 50 ug via INTRAVENOUS
  Filled 2015-08-12: qty 2

## 2015-08-12 MED ORDER — OXYCODONE-ACETAMINOPHEN 5-325 MG PO TABS: 1.0000 | ORAL_TABLET | Freq: Four times a day (QID) | ORAL | 0 refills | Status: DC | PRN

## 2015-08-12 MED ORDER — MORPHINE SULFATE (PF) 4 MG/ML IV SOLN
4.0000 mg | Freq: Once | INTRAVENOUS | Status: AC
  Administered 2015-08-12: 4 mg via INTRAVENOUS
  Filled 2015-08-12: qty 1

## 2015-08-12 MED ORDER — OXYCODONE-ACETAMINOPHEN 5-325 MG PO TABS
1.0000 | ORAL_TABLET | Freq: Once | ORAL | Status: AC
  Administered 2015-08-12: 1 via ORAL
  Filled 2015-08-12: qty 1

## 2015-08-12 MED ORDER — ONDANSETRON HCL 4 MG/2ML IJ SOLN
4.0000 mg | Freq: Once | INTRAMUSCULAR | Status: AC
  Administered 2015-08-12: 4 mg via INTRAVENOUS
  Filled 2015-08-12: qty 2

## 2015-08-12 NOTE — ED Triage Notes (Signed)
Sent to ED from [facility].  C/O low back pain R > L., C/O swelling to lower extremities since Sunday L > R, and back pain worsening over the past two hours.

## 2015-08-12 NOTE — ED Provider Notes (Signed)
[facility] Emergency Department Provider Note   ____________________________________________    I have reviewed the triage vital signs and the nursing notes.   HISTORY  Chief Complaint Flank pain    HPI LILLIAS Turner is a 45 y.o. female who presents with complaints of primarily right-sided flank pain that starts in her right back and radiates around to her right flank. She reports the pain is severe but improved after administration of fentanyl in triage. She has chronic pain in her right lower quadrant of her abdomen but she reports this pain is different. Patient has a long history of chronic cystitis and chronic hydronephrosis. She thinks she may have had a kidney stone in the past. She denies fevers or chills. No dysuria. No nausea or vomiting. She follows with urology at [facility]. She is on antibiotics for cystitis   Past Medical History:  Diagnosis Date  . Chronic kidney disease   . Urinary (tract) obstruction     Patient Active Problem List   Diagnosis Date Noted  . Iron deficiency anemia 08/09/2015  . Easy bruising 08/09/2015  . SOB (shortness of breath) 11/14/2013  . Palpitations 11/14/2013    Past Surgical History:  Procedure Laterality Date  . ABDOMINAL HYSTERECTOMY    . APPENDECTOMY    . BACK SURGERY    . CHOLECYSTECTOMY    . PERIPHERAL VASCULAR CATHETERIZATION  10/02/2014   Procedure: PICC Line Insertion;  Surgeon: Annice Needy, MD;  Location: [facility] INVASIVE CV LAB;  Service: Cardiovascular;;  . TONSILLECTOMY Bilateral     Prior to Admission medications   Medication Sig Start Date End Date Taking? Authorizing Provider  celecoxib (CELEBREX) 200 MG capsule  02/24/15   Historical Provider, MD  cyanocobalamin (,VITAMIN B-12,) 1000 MCG/ML injection INJECT 1 ML WEEKLY FOR 3 WEEKS AS DIRECTED AND THEN ONCE A MONTH FOR ANEMIA 06/07/14   Historical Provider, MD  nitrofurantoin (MACRODANTIN) 100 MG capsule Take 1 capsule (100 mg total)  by mouth 4 (four) times daily. 05/15/15   Jennye Moccasin, MD  omeprazole (PRILOSEC) 40 MG capsule  11/13/12   Historical Provider, MD  ondansetron (ZOFRAN) 4 MG tablet Take 1 tablet (4 mg total) by mouth every 8 (eight) hours as needed for nausea or vomiting. Patient not taking: Reported on 08/09/2015 05/15/15   Jennye Moccasin, MD  oxyCODONE-acetaminophen (ROXICET) 5-325 MG tablet Take 1 tablet by mouth every 6 (six) hours as needed. 08/12/15 08/11/16  Jene Every, MD  potassium chloride 20 MEQ TBCR Take 10 mEq by mouth daily. 05/15/15 05/22/15  Jennye Moccasin, MD  potassium chloride SA (K-DUR,KLOR-CON) 20 MEQ tablet  05/15/15   Historical Provider, MD     Allergies Other; Sulfa antibiotics; and Tape  Family History  Problem Relation Age of Onset  . Heart Problems Brother     Social History Social History  Substance Use Topics  . Smoking status: Never Smoker  . Smokeless tobacco: Never Used  . Alcohol use No    Review of Systems  Constitutional: No fever/chills  Gastrointestinal: As above Genitourinary: Negative for dysuria. Musculoskeletal: As above Skin: Negative for rash. Neurological: Negative for weakness  10-point ROS otherwise negative.  ____________________________________________   PHYSICAL EXAM:  VITAL SIGNS: ED Triage Vitals [08/12/15 1818]  Enc Vitals Group     BP 115/72     Pulse Rate 72     Resp 18     Temp 97.8 F (36.6 C)     Temp Source  Oral     SpO2 100 %     Weight 157 lb (71.2 kg)     Height 5\' 8"  (1.727 m)     Head Circumference      Peak Flow      Pain Score 10     Pain Loc      Pain Edu?      Excl. in GC?     Constitutional: Alert and oriented. No acute distress. Pleasant and interactive Eyes: Conjunctivae are normal.   Nose: No congestion/rhinnorhea. Mouth/Throat: Mucous membranes are moist.    Cardiovascular: Normal rate, regular rhythm.   Good peripheral circulation. Respiratory: Normal respiratory effort.  No retractions.    Gastrointestinal: Soft and nontender. No distention. Mild right CVA tenderness Genitourinary: deferred Musculoskeletal: No lower extremity tenderness nor edema.  Warm and well perfused Neurologic:  Normal speech and language. No gross focal neurologic deficits are appreciated.   Psychiatric: Mood and affect are normal. Speech and behavior are normal.  ____________________________________________   LABS (all labs ordered are listed, but only abnormal results are displayed)  Labs Reviewed  URINALYSIS COMPLETEWITH MICROSCOPIC ([facility] ONLY) - Abnormal; Notable for the following:       Result Value   Color, Urine YELLOW (*)    APPearance CLEAR (*)    All other components within normal limits  CBC - Abnormal; Notable for the following:    Hemoglobin 10.4 (*)    HCT 31.1 (*)    MCV 74.7 (*)    MCH 25.1 (*)    RDW 16.1 (*)    All other components within normal limits  BASIC METABOLIC PANEL - Abnormal; Notable for the following:    Potassium 2.9 (*)    Calcium 8.6 (*)    All other components within normal limits   ____________________________________________  EKG  None ____________________________________________  RADIOLOGY  CT scan unchanged from prior ____________________________________________   PROCEDURES  Procedure(s) performed: No    Critical Care performed: No ____________________________________________   INITIAL IMPRESSION / ASSESSMENT AND PLAN / ED COURSE  Pertinent labs & imaging results that were available during my care of the patient were reviewed by me and considered in my medical decision making (see chart for details).  Patient presents with right flank pain. We will obtain CT scan labs and urine given her history. Morphine and Zofran IV given  Clinical Course  Patient had significant relief after pain medication. Her CT scan is unchanged from prior. Her urinalysis is normal. Her lab work is normal. Unclear cause of her pain. I did offer  admission but the patient refused and would prefer to be discharged with pain medication. She knows to return if any worsening of her symptoms. She will follow-up with her urologist tomorrow ____________________________________________   FINAL CLINICAL IMPRESSION(S) / ED DIAGNOSES  Final diagnoses:  Right flank pain      NEW MEDICATIONS STARTED DURING THIS VISIT:  Discharge Medication List as of 08/12/2015 10:20 PM    START taking these medications   Details  oxyCODONE-acetaminophen (ROXICET) 5-325 MG tablet Take 1 tablet by mouth every 6 (six) hours as needed., Starting Thu 08/12/2015, Until Fri 08/11/2016, Print         Note:  This document was prepared using Dragon voice recognition software and may include unintentional dictation errors.    Jene Every, MD 08/13/15 0001

## 2015-09-01 ENCOUNTER — Ambulatory Visit: Payer: BLUE CROSS/BLUE SHIELD | Admitting: Oncology

## 2015-09-01 NOTE — Progress Notes (Signed)
[facility]  Telephone:(336) [phone] Fax:(336) [phone]  ID: Vickie Turner OB: 10/21/1970  MR#: [id number]  AOZ#:[id number]#:[id number]  Patient Care Team: Mick Sellavid P Fitzgerald, MD as PCP - General (Infectious Diseases)  CHIEF COMPLAINT: Iron deficiency anemia, easy bruising.  INTERVAL HISTORY: Patient returns to clinic today for further evaluation, discussion of her laboratory work, and treatment planning.  She continues to have bruising. She does not complain of weakness and fatigue. She currently feels well. She has no neurologic complaints. She denies any recent fevers. She has no chest pain, shortness breath, cough, or hemoptysis. She has good appetite and denies weight loss. She has no nausea, vomiting, constipation, or diarrhea. She has no melena or hematochezia. She offers no further specific complaints today.  REVIEW OF SYSTEMS:   Review of Systems  Constitutional: Positive for malaise/fatigue. Negative for fever and weight loss.  Respiratory: Negative.  Negative for cough and shortness of breath.   Cardiovascular: Negative.  Negative for chest pain.  Gastrointestinal: Negative.  Negative for abdominal pain, blood in stool and melena.  Genitourinary: Negative for hematuria.  Musculoskeletal: Negative.   Neurological: Positive for weakness.  Endo/Heme/Allergies: Bruises/bleeds easily.  Psychiatric/Behavioral: Negative.     As per HPI. Otherwise, a complete review of systems is negatve.  PAST MEDICAL HISTORY: Past Medical History:  Diagnosis Date  . Chronic kidney disease   . Urinary (tract) obstruction     PAST SURGICAL HISTORY: Past Surgical History:  Procedure Laterality Date  . ABDOMINAL HYSTERECTOMY    . APPENDECTOMY    . BACK SURGERY    . CHOLECYSTECTOMY    . PERIPHERAL VASCULAR CATHETERIZATION  10/02/2014   Procedure: PICC Line Insertion;  Surgeon: Annice NeedyJason S Dew, MD;  Location: [facility] INVASIVE CV LAB;  Service: Cardiovascular;;  . TONSILLECTOMY Bilateral      FAMILY HISTORY: Family History  Problem Relation Age of Onset  . Heart Problems Brother        ADVANCED DIRECTIVES:    HEALTH MAINTENANCE: Social History  Substance Use Topics  . Smoking status: Never Smoker  . Smokeless tobacco: Never Used  . Alcohol use No     Colonoscopy:  PAP:  Bone density:  Lipid panel:  Allergies  Allergen Reactions  . Other     Pork- religion contradiction    . Sulfa Antibiotics   . Tape     Other reaction(s): Other (See Comments) PLASTIC MEDICAL TAPE, Unknown Kind of Tape    Current Outpatient Prescriptions  Medication Sig Dispense Refill  . amoxicillin (AMOXIL) 875 MG tablet Take 875 mg by mouth 2 (two) times daily.    . celecoxib (CELEBREX) 200 MG capsule     . cyanocobalamin (,VITAMIN B-12,) 1000 MCG/ML injection INJECT 1 ML WEEKLY FOR 3 WEEKS AS DIRECTED AND THEN ONCE A MONTH FOR ANEMIA    . nitrofurantoin (MACRODANTIN) 100 MG capsule Take 1 capsule (100 mg total) by mouth 4 (four) times daily. 28 capsule 0  . omeprazole (PRILOSEC) 40 MG capsule     . ondansetron (ZOFRAN) 4 MG tablet Take 1 tablet (4 mg total) by mouth every 8 (eight) hours as needed for nausea or vomiting. 21 tablet 0  . potassium chloride SA (K-DUR,KLOR-CON) 20 MEQ tablet Take 20 mEq by mouth daily.      No current facility-administered medications for this visit.     OBJECTIVE: Vitals:   09/02/15 1429  BP: 111/74  Pulse: 71  Resp: 18  Temp: 98.3 F (36.8 C)     Body  mass index is 24.15 kg/m.    ECOG FS:0 - Asymptomatic  General: Well-developed, well-nourished, no acute distress. Eyes: Pink conjunctiva, anicteric sclera. Lungs: Clear to auscultation bilaterally. Heart: Regular rate and rhythm. No rubs, murmurs, or gallops. Abdomen: Soft, nontender, nondistended. No organomegaly noted, normoactive bowel sounds. Musculoskeletal: No edema, cyanosis, or clubbing. Neuro: Alert, answering all questions appropriately. Cranial nerves grossly intact. Skin:  No rashes or petechiae noted. Ecchymosis in various stages of healing on arms and legs. Psych: Normal affect.   LAB RESULTS:  Lab Results  Component Value Date   NA 138 08/12/2015   K 2.9 (L) 08/12/2015   CL 103 08/12/2015   CO2 28 08/12/2015   GLUCOSE 92 08/12/2015   BUN 17 08/12/2015   CREATININE 0.59 08/12/2015   CALCIUM 8.6 (L) 08/12/2015   PROT 5.8 (L) 04/15/2015   ALBUMIN 3.3 (L) 04/15/2015   AST 21 04/15/2015   ALT 13 (L) 04/15/2015   ALKPHOS 71 04/15/2015   BILITOT 0.4 04/15/2015   GFRNONAA >60 08/12/2015   GFRAA >60 08/12/2015    Lab Results  Component Value Date   WBC 6.8 08/12/2015   NEUTROABS 3.2 10/06/2014   HGB 10.4 (L) 08/12/2015   HCT 31.1 (L) 08/12/2015   MCV 74.7 (L) 08/12/2015   PLT 214 08/12/2015   Lab Results  Component Value Date   IRON 41 08/09/2015   TIBC 497 (H) 08/09/2015   IRONPCTSAT 8 (L) 08/09/2015   Lab Results  Component Value Date   FERRITIN 6 (L) 08/09/2015     STUDIES: Ct Renal Stone Study  Result Date: 08/12/2015 CLINICAL DATA:  [facility] C/O low back pain R > L., C/O swelling to lower extremities since Sunday L > R, and back pain worsening over the past two hours. EXAM: CT ABDOMEN AND PELVIS WITHOUT CONTRAST TECHNIQUE: Multidetector CT imaging of the abdomen and pelvis was performed following the standard protocol without IV contrast. COMPARISON:  05/15/2015 FINDINGS: Lung bases: Clear. Hepatobiliary: Normal liver. Gallbladder surgically absent. No bile duct dilation. Spleen, pancreas, adrenal glands:  Normal. Kidneys, ureters, bladder: Moderate right and mild left renal collecting system dilation. 15 mm homogeneous exophytic mildly hyper attenuating mass from the lower pole of the left kidney likely a cyst complicated by previous hemorrhage. This is stable. No other renal masses. No stones Send ureteral stones. Bladder is unremarkable. Uterus and adnexa: Uterus surgically absent.  No adnexal masses. The lymph nodes:  No  pathologically enlarged lymph nodes. Ascites:  Trace pelvic free fluid. Vascular: Scattered aortoiliac vascular calcifications. No aneurysm. Gastrointestinal: Status post gastric surgery with formation of a gastrojejunostomy. Surgical clips adjacent cecum or consistent with prior appendectomy. No evidence of bowel obstruction. No bowel wall thickening or adjacent inflammation. Musculoskeletal: Minor degenerative changes of the lower thoracic and lumbar spine. No osteoblastic or osteolytic lesions. IMPRESSION: 1. No acute findings within the abdomen pelvis. 2. Moderate right and mild left renal collecting system dilation. This is unchanged from the prior CT. It may be physiologic. Low grade ureteropelvic junction obstruction could have this appearance. No evidence of a ureteral stone. No intrarenal stones. 3. Status post cholecystectomy and gastric bypass surgery. Electronically Signed   By: Amie Portlandavid  Ormond M.D.   On: 08/12/2015 20:59    ASSESSMENT: Iron deficiency anemia, easy bruising.  PLAN:    1. Iron deficiency anemia: Patient's hemoglobin has slightly trended down and she has decreased iron stores. Her reticulocyte count is inappropriately low. She has no evidence of hemolysis. B-12,  folate, ANA, erythropoietin, and a hemoglobinopathy panel are all negative or within normal limits. Proceed with 510 mg IV Feraheme today. Return to clinic in 1 week for a second infusion and then in 3 months for repeat laboratory work and further evaluation.  2. Easy bruising: Patient's platelet count, PTT, and PT are all within normal limits. Platelet function assay and von Willebrand's panel are also within normal limits. Patient has no obvious underlying etiology of her bruising. Continue to monitor.  Patient expressed understanding and was in agreement with this plan. She also understands that She can call clinic at any time with any questions, concerns, or complaints.    [name], MD   09/05/2015 4:21  PM

## 2015-09-02 ENCOUNTER — Inpatient Hospital Stay: Payer: BLUE CROSS/BLUE SHIELD | Attending: Oncology | Admitting: Oncology

## 2015-09-02 ENCOUNTER — Inpatient Hospital Stay: Payer: BLUE CROSS/BLUE SHIELD

## 2015-09-02 VITALS — BP 111/74 | HR 71 | Temp 98.3°F | Resp 18 | Wt 158.8 lb

## 2015-09-02 DIAGNOSIS — R5383 Other fatigue: Secondary | ICD-10-CM | POA: Insufficient documentation

## 2015-09-02 DIAGNOSIS — D509 Iron deficiency anemia, unspecified: Secondary | ICD-10-CM | POA: Diagnosis not present

## 2015-09-02 DIAGNOSIS — Z79899 Other long term (current) drug therapy: Secondary | ICD-10-CM | POA: Diagnosis not present

## 2015-09-02 DIAGNOSIS — R233 Spontaneous ecchymoses: Secondary | ICD-10-CM

## 2015-09-02 DIAGNOSIS — N189 Chronic kidney disease, unspecified: Secondary | ICD-10-CM | POA: Diagnosis not present

## 2015-09-02 DIAGNOSIS — R531 Weakness: Secondary | ICD-10-CM

## 2015-09-02 DIAGNOSIS — R5381 Other malaise: Secondary | ICD-10-CM | POA: Insufficient documentation

## 2015-09-02 MED ORDER — SODIUM CHLORIDE 0.9 % IV SOLN
Freq: Once | INTRAVENOUS | Status: AC
  Administered 2015-09-02: 15:00:00 via INTRAVENOUS
  Filled 2015-09-02: qty 1000

## 2015-09-02 MED ORDER — SODIUM CHLORIDE 0.9 % IV SOLN
510.0000 mg | Freq: Once | INTRAVENOUS | Status: AC
  Administered 2015-09-02: 510 mg via INTRAVENOUS
  Filled 2015-09-02: qty 17

## 2015-09-02 NOTE — Progress Notes (Signed)
States is currently being treated for recurrent ear infections. Feels tired and drained of energy today.

## 2015-09-09 ENCOUNTER — Inpatient Hospital Stay: Payer: BLUE CROSS/BLUE SHIELD

## 2015-09-09 VITALS — BP 103/72 | HR 58 | Temp 97.4°F | Resp 18

## 2015-09-09 DIAGNOSIS — D509 Iron deficiency anemia, unspecified: Secondary | ICD-10-CM | POA: Diagnosis not present

## 2015-09-09 MED ORDER — SODIUM CHLORIDE 0.9 % IV SOLN
Freq: Once | INTRAVENOUS | Status: AC
  Administered 2015-09-09: 15:00:00 via INTRAVENOUS
  Filled 2015-09-09: qty 1000

## 2015-09-09 MED ORDER — SODIUM CHLORIDE 0.9 % IV SOLN
510.0000 mg | Freq: Once | INTRAVENOUS | Status: AC
  Administered 2015-09-09: 510 mg via INTRAVENOUS
  Filled 2015-09-09: qty 17

## 2015-10-18 ENCOUNTER — Other Ambulatory Visit: Payer: Self-pay | Admitting: Neurosurgery

## 2015-10-18 DIAGNOSIS — M50222 Other cervical disc displacement at C5-C6 level: Secondary | ICD-10-CM

## 2015-10-18 DIAGNOSIS — M67911 Unspecified disorder of synovium and tendon, right shoulder: Secondary | ICD-10-CM

## 2015-10-29 ENCOUNTER — Ambulatory Visit: Payer: BLUE CROSS/BLUE SHIELD

## 2015-12-01 ENCOUNTER — Other Ambulatory Visit: Payer: Self-pay | Admitting: *Deleted

## 2015-12-01 DIAGNOSIS — D509 Iron deficiency anemia, unspecified: Secondary | ICD-10-CM

## 2015-12-06 ENCOUNTER — Inpatient Hospital Stay: Payer: BLUE CROSS/BLUE SHIELD | Attending: Oncology

## 2015-12-06 DIAGNOSIS — D509 Iron deficiency anemia, unspecified: Secondary | ICD-10-CM | POA: Diagnosis present

## 2015-12-06 LAB — CBC WITH DIFFERENTIAL/PLATELET
BASOS ABS: 0 10*3/uL (ref 0–0.1)
BASOS PCT: 0 %
Eosinophils Absolute: 0 10*3/uL (ref 0–0.7)
Eosinophils Relative: 0 %
HEMATOCRIT: 36.2 % (ref 35.0–47.0)
HEMOGLOBIN: 12.5 g/dL (ref 12.0–16.0)
LYMPHS PCT: 16 %
Lymphs Abs: 1.6 10*3/uL (ref 1.0–3.6)
MCH: 28.8 pg (ref 26.0–34.0)
MCHC: 34.5 g/dL (ref 32.0–36.0)
MCV: 83.6 fL (ref 80.0–100.0)
MONO ABS: 0.3 10*3/uL (ref 0.2–0.9)
Monocytes Relative: 3 %
NEUTROS ABS: 8 10*3/uL — AB (ref 1.4–6.5)
NEUTROS PCT: 81 %
Platelets: 197 10*3/uL (ref 150–440)
RBC: 4.33 MIL/uL (ref 3.80–5.20)
RDW: 16.7 % — AB (ref 11.5–14.5)
WBC: 10.1 10*3/uL (ref 3.6–11.0)

## 2015-12-06 LAB — FERRITIN: Ferritin: 32 ng/mL (ref 11–307)

## 2015-12-06 LAB — IRON AND TIBC
IRON: 78 ug/dL (ref 28–170)
SATURATION RATIOS: 21 % (ref 10.4–31.8)
TIBC: 375 ug/dL (ref 250–450)
UIBC: 297 ug/dL

## 2015-12-13 ENCOUNTER — Encounter: Payer: Self-pay | Admitting: Oncology

## 2015-12-13 ENCOUNTER — Inpatient Hospital Stay: Payer: BLUE CROSS/BLUE SHIELD | Attending: Oncology | Admitting: Oncology

## 2015-12-13 ENCOUNTER — Inpatient Hospital Stay: Payer: BLUE CROSS/BLUE SHIELD

## 2015-12-13 VITALS — BP 117/73 | HR 80 | Temp 98.2°F | Ht 68.0 in | Wt 161.4 lb

## 2015-12-13 DIAGNOSIS — J069 Acute upper respiratory infection, unspecified: Secondary | ICD-10-CM | POA: Insufficient documentation

## 2015-12-13 DIAGNOSIS — Z79899 Other long term (current) drug therapy: Secondary | ICD-10-CM | POA: Diagnosis not present

## 2015-12-13 DIAGNOSIS — R233 Spontaneous ecchymoses: Secondary | ICD-10-CM | POA: Insufficient documentation

## 2015-12-13 DIAGNOSIS — N189 Chronic kidney disease, unspecified: Secondary | ICD-10-CM | POA: Diagnosis not present

## 2015-12-13 DIAGNOSIS — D509 Iron deficiency anemia, unspecified: Secondary | ICD-10-CM | POA: Diagnosis not present

## 2015-12-13 NOTE — Progress Notes (Signed)
Patient here for follow up. She has been ill with upper respiratory infection and has been extremely tired for the past several weeks.

## 2015-12-13 NOTE — Progress Notes (Signed)
[facility]  Telephone:(336) [phone] Fax:(336) [phone]  ID: Vickie Turner OB: 01/01/1971  MR#: [id number]  AOZ#:[id number]#:[id number]  Patient Care Team: Mick Sellavid P Fitzgerald, MD as PCP - General (Infectious Diseases)  CHIEF COMPLAINT: Iron deficiency anemia, easy bruising.  INTERVAL HISTORY: Patient returns to clinic today for further evaluation and consideration of additional IV iron. She currently feels well and is asymptomatic. She has had an upper respiratory infection for several weeks, but denies fevers. She has no neurologic complaints. She has no chest pain, shortness breath, or hemoptysis. She has good appetite and denies weight loss. She has no nausea, vomiting, constipation, or diarrhea. She has no melena or hematochezia. She offers no further specific complaints today.  REVIEW OF SYSTEMS:   Review of Systems  Constitutional: Negative for fever, malaise/fatigue and weight loss.  HENT: Positive for congestion.   Respiratory: Positive for cough. Negative for shortness of breath.   Cardiovascular: Negative.  Negative for chest pain.  Gastrointestinal: Negative.  Negative for abdominal pain, blood in stool and melena.  Genitourinary: Negative for hematuria.  Musculoskeletal: Negative.   Neurological: Negative for weakness.  Endo/Heme/Allergies: Does not bruise/bleed easily.  Psychiatric/Behavioral: Negative.  The patient is not nervous/anxious.     As per HPI. Otherwise, a complete review of systems is negative.  PAST MEDICAL HISTORY: Past Medical History:  Diagnosis Date  . Chronic kidney disease   . Urinary (tract) obstruction     PAST SURGICAL HISTORY: Past Surgical History:  Procedure Laterality Date  . ABDOMINAL HYSTERECTOMY    . APPENDECTOMY    . BACK SURGERY    . CHOLECYSTECTOMY    . PERIPHERAL VASCULAR CATHETERIZATION  10/02/2014   Procedure: PICC Line Insertion;  Surgeon: Annice NeedyJason S Dew, MD;  Location: [facility] INVASIVE CV LAB;  Service: Cardiovascular;;  .  TONSILLECTOMY Bilateral     FAMILY HISTORY: Family History  Problem Relation Age of Onset  . Heart Problems Brother        ADVANCED DIRECTIVES:    HEALTH MAINTENANCE: Social History  Substance Use Topics  . Smoking status: Never Smoker  . Smokeless tobacco: Never Used  . Alcohol use No     Colonoscopy:  PAP:  Bone density:  Lipid panel:  Allergies  Allergen Reactions  . Other     Pork- religion contradiction    . Sulfa Antibiotics   . Tape     Other reaction(s): Other (See Comments) PLASTIC MEDICAL TAPE, Unknown Kind of Tape    Current Outpatient Prescriptions  Medication Sig Dispense Refill  . azithromycin (ZITHROMAX) 250 MG tablet Take 1 mg by mouth daily.    . celecoxib (CELEBREX) 200 MG capsule     . cyanocobalamin (,VITAMIN B-12,) 1000 MCG/ML injection INJECT 1 ML WEEKLY FOR 3 WEEKS AS DIRECTED AND THEN ONCE A MONTH FOR ANEMIA    . nitrofurantoin (MACRODANTIN) 100 MG capsule Take 1 capsule (100 mg total) by mouth 4 (four) times daily. 28 capsule 0  . omeprazole (PRILOSEC) 40 MG capsule     . ondansetron (ZOFRAN) 4 MG tablet Take 1 tablet (4 mg total) by mouth every 8 (eight) hours as needed for nausea or vomiting. (Patient not taking: Reported on 12/13/2015) 21 tablet 0  . potassium chloride SA (K-DUR,KLOR-CON) 20 MEQ tablet Take 20 mEq by mouth daily.      No current facility-administered medications for this visit.     OBJECTIVE: Vitals:   12/13/15 1446  BP: 117/73  Pulse: 80  Temp: 98.2 F (36.8 C)  Body mass index is 24.54 kg/m.    ECOG FS:0 - Asymptomatic  General: Well-developed, well-nourished, no acute distress. Eyes: Pink conjunctiva, anicteric sclera. Lungs: Clear to auscultation bilaterally. Heart: Regular rate and rhythm. No rubs, murmurs, or gallops. Abdomen: Soft, nontender, nondistended. No organomegaly noted, normoactive bowel sounds. Musculoskeletal: No edema, cyanosis, or clubbing. Neuro: Alert, answering all questions  appropriately. Cranial nerves grossly intact. Skin: No rashes or petechiae noted. Ecchymosis in various stages of healing on arms and legs. Psych: Normal affect.   LAB RESULTS:  Lab Results  Component Value Date   NA 138 08/12/2015   K 2.9 (L) 08/12/2015   CL 103 08/12/2015   CO2 28 08/12/2015   GLUCOSE 92 08/12/2015   BUN 17 08/12/2015   CREATININE 0.59 08/12/2015   CALCIUM 8.6 (L) 08/12/2015   PROT 5.8 (L) 04/15/2015   ALBUMIN 3.3 (L) 04/15/2015   AST 21 04/15/2015   ALT 13 (L) 04/15/2015   ALKPHOS 71 04/15/2015   BILITOT 0.4 04/15/2015   GFRNONAA >60 08/12/2015   GFRAA >60 08/12/2015    Lab Results  Component Value Date   WBC 10.1 12/06/2015   NEUTROABS 8.0 (H) 12/06/2015   HGB 12.5 12/06/2015   HCT 36.2 12/06/2015   MCV 83.6 12/06/2015   PLT 197 12/06/2015   Lab Results  Component Value Date   IRON 78 12/06/2015   TIBC 375 12/06/2015   IRONPCTSAT 21 12/06/2015   Lab Results  Component Value Date   FERRITIN 32 12/06/2015     STUDIES: No results found.  ASSESSMENT: Iron deficiency anemia, easy bruising.  PLAN:    1. Iron deficiency anemia: Patient's hemoglobin and iron stores are now within normal limits. Previously, the remainder of her laboratory work, including hemoglobinopathy profile, were either negative or within normal limits. She does not require additional IV iron today. Patient last received Feraheme in August 2017. Return to clinic in 4 months for repeat laboratory work and further evaluation.  2. Easy bruising: Patient's platelet count, PTT, and PT are all within normal limits. Platelet function assay and von Willebrand's panel are also within normal limits. Patient has no obvious underlying etiology of her bruising. Continue to monitor.  Patient expressed understanding and was in agreement with this plan. She also understands that She can call clinic at any time with any questions, concerns, or complaints.    Jeralyn Ruthsimothy J Jovani Colquhoun, MD    12/15/2015 3:32 PM

## 2016-02-14 ENCOUNTER — Emergency Department: Payer: BLUE CROSS/BLUE SHIELD

## 2016-02-14 ENCOUNTER — Emergency Department
Admission: EM | Admit: 2016-02-14 | Discharge: 2016-02-14 | Disposition: A | Discharge status: Home / Self Care | Payer: BLUE CROSS/BLUE SHIELD | Attending: Emergency Medicine | Admitting: Emergency Medicine

## 2016-02-14 DIAGNOSIS — Y9301 Activity, walking, marching and hiking: Secondary | ICD-10-CM | POA: Insufficient documentation

## 2016-02-14 DIAGNOSIS — R109 Unspecified abdominal pain: Secondary | ICD-10-CM | POA: Insufficient documentation

## 2016-02-14 DIAGNOSIS — N189 Chronic kidney disease, unspecified: Secondary | ICD-10-CM | POA: Insufficient documentation

## 2016-02-14 DIAGNOSIS — R600 Localized edema: Secondary | ICD-10-CM | POA: Diagnosis not present

## 2016-02-14 DIAGNOSIS — Y999 Unspecified external cause status: Secondary | ICD-10-CM | POA: Insufficient documentation

## 2016-02-14 DIAGNOSIS — E876 Hypokalemia: Secondary | ICD-10-CM

## 2016-02-14 DIAGNOSIS — R300 Dysuria: Secondary | ICD-10-CM

## 2016-02-14 DIAGNOSIS — Y929 Unspecified place or not applicable: Secondary | ICD-10-CM | POA: Insufficient documentation

## 2016-02-14 DIAGNOSIS — R0602 Shortness of breath: Secondary | ICD-10-CM | POA: Diagnosis not present

## 2016-02-14 DIAGNOSIS — Z79899 Other long term (current) drug therapy: Secondary | ICD-10-CM | POA: Insufficient documentation

## 2016-02-14 DIAGNOSIS — S93402A Sprain of unspecified ligament of left ankle, initial encounter: Secondary | ICD-10-CM

## 2016-02-14 DIAGNOSIS — R609 Edema, unspecified: Secondary | ICD-10-CM

## 2016-02-14 DIAGNOSIS — S99912A Unspecified injury of left ankle, initial encounter: Secondary | ICD-10-CM | POA: Diagnosis present

## 2016-02-14 DIAGNOSIS — N9489 Other specified conditions associated with female genital organs and menstrual cycle: Secondary | ICD-10-CM | POA: Diagnosis not present

## 2016-02-14 DIAGNOSIS — X501XXA Overexertion from prolonged static or awkward postures, initial encounter: Secondary | ICD-10-CM | POA: Diagnosis not present

## 2016-02-14 LAB — URINALYSIS, COMPLETE (UACMP) WITH MICROSCOPIC
BACTERIA UA: NONE SEEN
Bilirubin Urine: NEGATIVE
Glucose, UA: NEGATIVE mg/dL
Hgb urine dipstick: NEGATIVE
KETONES UR: NEGATIVE mg/dL
LEUKOCYTES UA: NEGATIVE
Nitrite: NEGATIVE
PH: 6 (ref 5.0–8.0)
Protein, ur: NEGATIVE mg/dL
Specific Gravity, Urine: 1.002 — ABNORMAL LOW (ref 1.005–1.030)

## 2016-02-14 LAB — COMPREHENSIVE METABOLIC PANEL
ALK PHOS: 64 U/L (ref 38–126)
ALT: 20 U/L (ref 14–54)
ANION GAP: 5 (ref 5–15)
AST: 26 U/L (ref 15–41)
Albumin: 3.7 g/dL (ref 3.5–5.0)
BILIRUBIN TOTAL: 0.5 mg/dL (ref 0.3–1.2)
BUN: 14 mg/dL (ref 6–20)
CALCIUM: 8 mg/dL — AB (ref 8.9–10.3)
CO2: 32 mmol/L (ref 22–32)
Chloride: 103 mmol/L (ref 101–111)
Creatinine, Ser: 0.7 mg/dL (ref 0.44–1.00)
GFR calc Af Amer: >60 mL/min (ref >60)
Glucose, Bld: 99 mg/dL (ref 65–99)
POTASSIUM: 3 mmol/L — AB (ref 3.5–5.1)
Sodium: 140 mmol/L (ref 135–145)
TOTAL PROTEIN: 6.2 g/dL — AB (ref 6.5–8.1)

## 2016-02-14 LAB — CBC
HCT: 35.9 % (ref 35.0–47.0)
HEMOGLOBIN: 12.5 g/dL (ref 12.0–16.0)
MCH: 30.4 pg (ref 26.0–34.0)
MCHC: 34.8 g/dL (ref 32.0–36.0)
MCV: 87.2 fL (ref 80.0–100.0)
Platelets: 215 10*3/uL (ref 150–440)
RBC: 4.11 MIL/uL (ref 3.80–5.20)
RDW: 13.3 % (ref 11.5–14.5)
WBC: 8.4 10*3/uL (ref 3.6–11.0)

## 2016-02-14 LAB — LIPASE, BLOOD: LIPASE: 25 U/L (ref 11–51)

## 2016-02-14 LAB — BRAIN NATRIURETIC PEPTIDE: B Natriuretic Peptide: 133 pg/mL — ABNORMAL HIGH (ref 0.0–100.0)

## 2016-02-14 LAB — HCG, QUANTITATIVE, PREGNANCY: hCG, Beta Chain, Quant, S: 2 m[IU]/mL (ref <5)

## 2016-02-14 MED ORDER — CIPROFLOXACIN HCL 500 MG PO TABS: 500.0000 mg | ORAL_TABLET | Freq: Two times a day (BID) | ORAL | 0 refills | Status: DC

## 2016-02-14 MED ORDER — FUROSEMIDE 20 MG PO TABS: 20.0000 mg | ORAL_TABLET | Freq: Every day | ORAL | 0 refills | Status: DC

## 2016-02-14 MED ORDER — IOPAMIDOL (ISOVUE-370) INJECTION 76%
100.0000 mL | Freq: Once | INTRAVENOUS | Status: AC | PRN
  Administered 2016-02-14: 100 mL via INTRAVENOUS
  Filled 2016-02-14: qty 100

## 2016-02-14 MED ORDER — OXYCODONE HCL 5 MG PO TABS: 5.0000 mg | ORAL_TABLET | Freq: Four times a day (QID) | ORAL | 0 refills | Status: DC | PRN

## 2016-02-14 MED ORDER — POTASSIUM CHLORIDE ER 10 MEQ PO TBCR: 20.0000 meq | EXTENDED_RELEASE_TABLET | Freq: Every day | ORAL | 0 refills | Status: DC

## 2016-02-14 MED ORDER — OXYCODONE HCL 5 MG PO TABS
5.0000 mg | ORAL_TABLET | Freq: Once | ORAL | Status: AC
  Administered 2016-02-14: 5 mg via ORAL
  Filled 2016-02-14: qty 1

## 2016-02-14 NOTE — ED Triage Notes (Signed)
Pt started with burning with urination last night - pt is having urinary frequency and bladder spasms - pt reports hx of kidney problems - pt is retaining fluid in bilat lower ext - she also twisted her left ankle yesterday and is having pain with ambulation and bearing weight

## 2016-02-14 NOTE — ED Notes (Signed)
Pt alert and oriented X4, active, cooperative, pt in NAD. RR even and unlabored, color WNL.  Pt informed to return if any life threatening symptoms occur.   

## 2016-02-14 NOTE — ED Provider Notes (Signed)
[facility] Emergency Department Provider Note   ____________________________________________   First MD Initiated Contact with Patient 02/14/16 1734     (approximate)  I have reviewed the triage vital signs and the nursing notes.   HISTORY  Chief Complaint Urinary Tract Infection and Ankle Pain    HPI Vickie Turner is a 46 y.o. female history of chronic kidney problems, urinary tract obstruction in the past treated surgically with stent placement and frequent urinary tract infections  Patient reports from of the last 2 days she has an aching discomfort in her right flank, yesterday she be experiencing pain with urination, she took a single dose of ciprofloxacin at home. She also took an Azo tablet. She drinks printing increased feeling of urgency urinate, and also pain in the right flank and "kidney area"  In addition she reports that 2 days ago while walking she rolled her left ankle slightly, there was bruising and slightly swollen but she'll walk on it. She is also noticed over the last 2-3 days that she's been experiencing swelling in both of her lower legs, and reports that she is swollen to about the level of the knees and lower extremities. She does have a history of tasting Lasix in the past, and resume taking one dose of that today.  No fevers or chills. No nausea or vomiting. Reports moderate pain in the right flank.   Past Medical History:  Diagnosis Date  . Chronic kidney disease   . Urinary (tract) obstruction     Patient Active Problem List   Diagnosis Date Noted  . Iron deficiency anemia 08/09/2015  . Easy bruising 08/09/2015  . SOB (shortness of breath) 11/14/2013  . Palpitations 11/14/2013    Past Surgical History:  Procedure Laterality Date  . ABDOMINAL HYSTERECTOMY    . APPENDECTOMY    . BACK SURGERY    . CHOLECYSTECTOMY    . PERIPHERAL VASCULAR CATHETERIZATION  10/02/2014   Procedure: PICC Line Insertion;  Surgeon: Annice Needy, MD;  Location: [facility] INVASIVE CV LAB;  Service: Cardiovascular;;  . TONSILLECTOMY Bilateral     Prior to Admission medications   Medication Sig Start Date End Date Taking? Authorizing Provider  celecoxib (CELEBREX) 200 MG capsule  02/24/15   Historical Provider, MD  ciprofloxacin (CIPRO) 500 MG tablet Take 1 tablet (500 mg total) by mouth 2 (two) times daily. 02/14/16   Sharyn Creamer, MD  cyanocobalamin (,VITAMIN B-12,) 1000 MCG/ML injection INJECT 1 ML WEEKLY FOR 3 WEEKS AS DIRECTED AND THEN ONCE A MONTH FOR ANEMIA 06/07/14   Historical Provider, MD  furosemide (LASIX) 20 MG tablet Take 1 tablet (20 mg total) by mouth daily. 02/14/16 02/19/16  Sharyn Creamer, MD  nitrofurantoin (MACRODANTIN) 100 MG capsule Take 1 capsule (100 mg total) by mouth 4 (four) times daily. 05/15/15   Jennye Moccasin, MD  omeprazole (PRILOSEC) 40 MG capsule  11/13/12   Historical Provider, MD  ondansetron (ZOFRAN) 4 MG tablet Take 1 tablet (4 mg total) by mouth every 8 (eight) hours as needed for nausea or vomiting. Patient not taking: Reported on 12/13/2015 05/15/15   Jennye Moccasin, MD  oxyCODONE (OXY IR/ROXICODONE) 5 MG immediate release tablet Take 1 tablet (5 mg total) by mouth every 6 (six) hours as needed for severe pain. 02/14/16   Sharyn Creamer, MD  potassium chloride (K-DUR) 10 MEQ tablet Take 2 tablets (20 mEq total) by mouth daily. 02/14/16 02/19/16  Sharyn Creamer, MD    Allergies Other;  Sulfa antibiotics; and Tape  Family History  Problem Relation Age of Onset  . Heart Problems Brother     Social History Social History  Substance Use Topics  . Smoking status: Never Smoker  . Smokeless tobacco: Never Used  . Alcohol use No    Review of Systems Constitutional: No fever/chills Eyes: No visual changes. ENT: No sore throat. Cardiovascular: Denies chest pain. Respiratory: Denies shortness of breath. Gastrointestinal:   No nausea, no vomiting.  No diarrhea.  No constipation. Genitourinary: See history of present  illness Musculoskeletal: Negative for back pain. Skin: Negative for rash. Neurological: Negative for headaches, focal weakness or numbness.  10-point ROS otherwise negative.  ____________________________________________   PHYSICAL EXAM:  VITAL SIGNS: ED Triage Vitals  Enc Vitals Group     BP 02/14/16 1511 132/77     Pulse Rate 02/14/16 1511 88     Resp 02/14/16 1511 16     Temp 02/14/16 1511 97.8 F (36.6 C)     Temp Source 02/14/16 1511 Oral     SpO2 02/14/16 1511 98 %     Weight 02/14/16 1511 170 lb (77.1 kg)     Height 02/14/16 1511 5\' 8"  (1.727 m)     Head Circumference --      Peak Flow --      Pain Score 02/14/16 1512 6     Pain Loc --      Pain Edu? --      Excl. in GC? --     Constitutional: Alert and oriented. Well appearing and in no acute distress. Eyes: Conjunctivae are normal. PERRL. EOMI. Head: Atraumatic. Nose: No congestion/rhinnorhea. Mouth/Throat: Mucous membranes are moist.  Oropharynx non-erythematous. Neck: No stridor.   Cardiovascular: Normal rate, regular rhythm. Grossly normal heart sounds.  Good peripheral circulation. Respiratory: Normal respiratory effort.  No retractions. Lungs CTAB. Gastrointestinal: Soft and nontender except she reports moderate tenderness in the right flank, she also reports mild right CVA tenderness without CVA tenderness on the left. No pain at McBurney's point. Negative Murphy. No left-sided abdominal pain.  Musculoskeletal:   Lower Extremities  No edema. Normal DP/PT pulses bilateral with good cap refill.  Normal neuro-motor function lower extremities bilateral.  RIGHT Right lower extremity demonstrates normal strength, good use of all muscles. No  bruising or contusions of the right hip, right knee, right ankle. Full range of motion of the right lower extremity without pain. No pain on axial loading. No evidence of trauma.  LEFT Left lower extremity demonstrates normal strength, good use of all muscles. No  Injury to the hip,  knee, Full range of motion of the left lower extremity without pain. No pain on axial loading. No evidence of trauma except for some mild contusion over the left lateral ankle joint, but the patient is able to range it well without pain on axial loading and reports she is able walk on it without pain or trouble.  Lower extremities have about 1+ slightly pitting edema bilaterally   Neurologic:  Normal speech and language. No gross focal neurologic deficits are appreciated.  Skin:  Skin is warm, dry and intact. No rash noted. Psychiatric: Mood and affect are normal. Speech and behavior are normal.  ____________________________________________   LABS (all labs ordered are listed, but only abnormal results are displayed)  Labs Reviewed  URINALYSIS, COMPLETE (UACMP) WITH MICROSCOPIC - Abnormal; Notable for the following:       Result Value   Color, Urine STRAW (*)    APPearance CLEAR (*)  Specific Gravity, Urine 1.002 (*)    Squamous Epithelial / LPF 0-5 (*)    All other components within normal limits  COMPREHENSIVE METABOLIC PANEL - Abnormal; Notable for the following:    Potassium 3.0 (*)    Calcium 8.0 (*)    Total Protein 6.2 (*)    All other components within normal limits  BRAIN NATRIURETIC PEPTIDE - Abnormal; Notable for the following:    B Natriuretic Peptide 133.0 (*)    All other components within normal limits  URINE CULTURE  CBC  LIPASE, BLOOD  HCG, QUANTITATIVE, PREGNANCY   ____________________________________________  EKG   ____________________________________________  RADIOLOGY  Dg Ankle Complete Left  Result Date: 02/14/2016 CLINICAL DATA:  Tripping injury of the ankle with bruising and pain. EXAM: LEFT ANKLE COMPLETE - 3+ VIEW COMPARISON:  09/20/2014 FINDINGS: There is abnormal soft tissue swelling overlying both malleoli. No visible fracture. Plafond and talar dome intact. Base of the fifth metatarsal appears intact. IMPRESSION: 1. Soft  tissue swelling overlying both malleoli, but without visible underlying fracture. Electronically Signed   By: Gaylyn RongWalter  Liebkemann M.D.   On: 02/14/2016 18:10   [place] Venous Img Lower Bilateral  Result Date: 02/14/2016 CLINICAL DATA:  Bilateral lower extremity edema EXAM: BILATERAL LOWER EXTREMITY VENOUS DOPPLER ULTRASOUND TECHNIQUE: Gray-scale sonography with graded compression, as well as color Doppler and duplex ultrasound were performed to evaluate the lower extremity deep venous systems from the level of the common femoral vein and including the common femoral, femoral, profunda femoral, popliteal and calf veins including the posterior tibial, peroneal and gastrocnemius veins when visible. The superficial great saphenous vein was also interrogated. Spectral Doppler was utilized to evaluate flow at rest and with distal augmentation maneuvers in the common femoral, femoral and popliteal veins. COMPARISON:  None. FINDINGS: RIGHT LOWER EXTREMITY Common Femoral Vein: No evidence of thrombus. Normal compressibility, respiratory phasicity and response to augmentation. Saphenofemoral Junction: No evidence of thrombus. Normal compressibility and flow on color Doppler imaging. Profunda Femoral Vein: No evidence of thrombus. Normal compressibility and flow on color Doppler imaging. Femoral Vein: No evidence of thrombus. Normal compressibility, respiratory phasicity and response to augmentation. Popliteal Vein: No evidence of thrombus. Normal compressibility, respiratory phasicity and response to augmentation. Calf Veins: No evidence of thrombus. Normal compressibility and flow on color Doppler imaging. Superficial Great Saphenous Vein: No evidence of thrombus. Normal compressibility and flow on color Doppler imaging. Venous Reflux:  None. Other Findings:  None. LEFT LOWER EXTREMITY Common Femoral Vein: No evidence of thrombus. Normal compressibility, respiratory phasicity and response to augmentation. Saphenofemoral  Junction: No evidence of thrombus. Normal compressibility and flow on color Doppler imaging. Profunda Femoral Vein: No evidence of thrombus. Normal compressibility and flow on color Doppler imaging. Femoral Vein: No evidence of thrombus. Normal compressibility, respiratory phasicity and response to augmentation. Popliteal Vein: No evidence of thrombus. Normal compressibility, respiratory phasicity and response to augmentation. Calf Veins: No evidence of thrombus. Normal compressibility and flow on color Doppler imaging. Superficial Great Saphenous Vein: No evidence of thrombus. Normal compressibility and flow on color Doppler imaging. Venous Reflux:  None. Other Findings:  None. IMPRESSION: No evidence of deep venous thrombosis. Electronically Signed   By: Elige KoHetal  Patel   On: 02/14/2016 19:08   Ct Angio Abd/pel W And/or Wo Contrast  Result Date: 02/14/2016 CLINICAL DATA:  Burning with urination, right flank pain and bilateral lower extremity edema EXAM: CTA ABDOMEN AND PELVIS wITHOUT AND WITH CONTRAST TECHNIQUE: Multidetector CT imaging of the abdomen and pelvis was  performed using the standard protocol during bolus administration of intravenous contrast. Multiplanar reconstructed images and MIPs were obtained and reviewed to evaluate the vascular anatomy. CONTRAST:  100 mL Isovue 370 intravenous COMPARISON:  08/12/2015, 05/15/2015, 02/27/2007 FINDINGS: VASCULAR Aorta: Non aneurysmal. No dissection. Mild atherosclerotic calcifications. Celiac: Patent at the origin.  No significant stenosis. SMA: Patent Renals: Single right and left renal artery. Minimal calcification at the origin of the right renal artery but no hemodynamically significant stenosis. IMA: Patent Inflow: Mild to moderate atherosclerotic calcifications of the distal aorta and common iliac artery. No significant stenosis or occlusive disease of the internal or external iliac artery. Proximal Outflow: Bilateral common femoral and proximal superficial  femoral artery show no significant stenosis. Veins: No obvious venous abnormality within the limitations of this arterial phase study. Review of the MIP images confirms the above findings. NON-VASCULAR Lower chest: Lung bases demonstrate no acute consolidation or effusion. Normal heart size. Hepatobiliary: Focal hypodensity near the falciform ligament could relate to focal fatty infiltration. Surgical clips in the gallbladder fossa. No biliary dilatation. Pancreas: Unremarkable. No pancreatic ductal dilatation or surrounding inflammatory changes. Spleen: Normal in size without focal abnormality. Adrenals/Urinary Tract: Adrenal glands are within normal limits. Stable 1.6 cm hypodense exophytic lesion lower pole left kidney, possible hemorrhagic or complicated cyst. Moderate dilatation of right extrarenal pelvis. Mildly enlarged right ureter. No calcified stones. Bladder is normal. Stomach/Bowel: Postsurgical changes of the stomach and small bowel. Postsurgical changes at the cecum consistent with prior appendectomy. No obstruction. No bowel wall thickening. Lymphatic: No grossly enlarged abdominal or pelvic lymph nodes. Reproductive: No adnexal masses. Other: No free air or free fluid. Musculoskeletal: No acute or suspicious bone lesions. IMPRESSION: VASCULAR Scattered atherosclerotic calcifications within the aorta and branch vessels. No hemodynamically significant focal stenosis is identified involving the mesenteric or renal vessels. NON-VASCULAR No CT evidence for acute intra-abdominal or pelvic pathology. Stable mild right hydronephrosis with prominent extrarenal pelvis and dilated right ureter, no stones along the course of the right ureter. Stable 1.6 cm hypodense left lower pole kidney lesion, possible hemorrhagic cyst. Electronically Signed   By: Jasmine Pang M.D.   On: 02/14/2016 19:35    ____________________________________________   PROCEDURES  Procedure(s) performed:  None  Procedures  Critical Care performed: No  ____________________________________________   INITIAL IMPRESSION / ASSESSMENT AND PLAN / ED COURSE  Pertinent labs & imaging results that were available during my care of the patient were reviewed by me and considered in my medical decision making (see chart for details).  Patient presents for multiple issues. Left ankle, mild contusion after what sounds to be a sprain. X-ray negative for fracture, we'll treat conservatively as a sprain and she reports being able to handle it without difficulty at this time. Right flank pain, CT scan very reassuring, stable and reassuring labs and hemodynamics. Chronic findings as noted, patient well aware and follows with a urologist at [facility] for this. In addition she reports dysuria, has a history of frequent urinary tract infections and started herself on Cipro but ran out of this medication yesterday. I'll prescribe additional ciprofloxacin, send urine culture and today's urinalysis reassuring Peripheral edema, negative for DVT, no proximal clots noted on CT scan. Patient has a history of edema in the past, and started Lasix yesterday evening and ran out, and I will prescribe her Lasix and potassium for which she has chronic hypokalemia but reports also running out of her potassium tablets  Return precautions and treatment recommendations and follow-up discussed with  the patient who is agreeable with the plan.       ____________________________________________   FINAL CLINICAL IMPRESSION(S) / ED DIAGNOSES  Final diagnoses:  Sprain of left ankle, unspecified ligament, initial encounter  Peripheral edema  Chronic hypokalemia  Dysuria      NEW MEDICATIONS STARTED DURING THIS VISIT:  New Prescriptions   CIPROFLOXACIN (CIPRO) 500 MG TABLET    Take 1 tablet (500 mg total) by mouth 2 (two) times daily.   FUROSEMIDE (LASIX) 20 MG TABLET    Take 1 tablet (20 mg total) by mouth daily.    OXYCODONE (OXY IR/ROXICODONE) 5 MG IMMEDIATE RELEASE TABLET    Take 1 tablet (5 mg total) by mouth every 6 (six) hours as needed for severe pain.   POTASSIUM CHLORIDE (K-DUR) 10 MEQ TABLET    Take 2 tablets (20 mEq total) by mouth daily.     Note:  This document was prepared using Dragon voice recognition software and may include unintentional dictation errors.     Sharyn Creamer, MD 02/14/16 2019

## 2016-02-14 NOTE — ED Notes (Signed)
Pt states burning upon urination and chills, states she has hx of ecoli infection and is on long term abx, states she has been out of abx for 1 week, states she tripped and rolled her ankle and continues to have left ankle briusing, states bilateral ankle swelling with a hx of kidney obstruction

## 2016-02-16 LAB — URINE CULTURE
CULTURE: NO GROWTH
SPECIAL REQUESTS: NORMAL

## 2016-04-10 ENCOUNTER — Inpatient Hospital Stay: Payer: BLUE CROSS/BLUE SHIELD | Attending: Oncology

## 2016-04-10 DIAGNOSIS — M7981 Nontraumatic hematoma of soft tissue: Secondary | ICD-10-CM | POA: Diagnosis not present

## 2016-04-10 DIAGNOSIS — Z79899 Other long term (current) drug therapy: Secondary | ICD-10-CM | POA: Insufficient documentation

## 2016-04-10 DIAGNOSIS — I959 Hypotension, unspecified: Secondary | ICD-10-CM | POA: Diagnosis not present

## 2016-04-10 DIAGNOSIS — N189 Chronic kidney disease, unspecified: Secondary | ICD-10-CM | POA: Diagnosis not present

## 2016-04-10 DIAGNOSIS — D509 Iron deficiency anemia, unspecified: Secondary | ICD-10-CM | POA: Diagnosis not present

## 2016-04-10 LAB — CBC WITH DIFFERENTIAL/PLATELET
Basophils Absolute: 0 10*3/uL (ref 0–0.1)
Basophils Relative: 1 %
Eosinophils Absolute: 0.1 10*3/uL (ref 0–0.7)
Eosinophils Relative: 2 %
HCT: 35.7 % (ref 35.0–47.0)
Hemoglobin: 12.7 g/dL (ref 12.0–16.0)
LYMPHS ABS: 2.3 10*3/uL (ref 1.0–3.6)
LYMPHS PCT: 42 %
MCH: 30 pg (ref 26.0–34.0)
MCHC: 35.7 g/dL (ref 32.0–36.0)
MCV: 84.1 fL (ref 80.0–100.0)
MONOS PCT: 6 %
Monocytes Absolute: 0.3 10*3/uL (ref 0.2–0.9)
NEUTROS ABS: 2.7 10*3/uL (ref 1.4–6.5)
NEUTROS PCT: 49 %
Platelets: 211 10*3/uL (ref 150–440)
RBC: 4.24 MIL/uL (ref 3.80–5.20)
RDW: 12.2 % (ref 11.5–14.5)
WBC: 5.4 10*3/uL (ref 3.6–11.0)

## 2016-04-10 LAB — IRON AND TIBC
Iron: 103 ug/dL (ref 28–170)
Saturation Ratios: 26 % (ref 10.4–31.8)
TIBC: 396 ug/dL (ref 250–450)
UIBC: 293 ug/dL

## 2016-04-10 LAB — FERRITIN: Ferritin: 25 ng/mL (ref 11–307)

## 2016-04-12 ENCOUNTER — Ambulatory Visit: Payer: BLUE CROSS/BLUE SHIELD | Admitting: Oncology

## 2016-04-12 ENCOUNTER — Ambulatory Visit: Payer: BLUE CROSS/BLUE SHIELD

## 2016-04-12 ENCOUNTER — Other Ambulatory Visit: Payer: BLUE CROSS/BLUE SHIELD

## 2016-04-12 ENCOUNTER — Other Ambulatory Visit: Payer: Self-pay | Admitting: Oncology

## 2016-04-12 NOTE — Progress Notes (Signed)
[facility]  [phone] [phone]  ID: Vickie Turner OB: 1970/06/01  MR#: [id number]  QIO#:[id number]  Patient Care Team: Mick Sell, MD as PCP - General (Infectious Diseases)  CHIEF COMPLAINT: Iron deficiency anemia, easy bruising.  INTERVAL HISTORY: Patient returns to clinic today for repeat laboratory work, further evaluation, and consideration of additional IV iron. She continues to feel well and is asymptomatic. She denies any recent fevers or illnesses. She has no neurologic complaints. She has no chest pain, shortness breath, or hemoptysis. She has a good appetite and denies weight loss. She has no nausea, vomiting, constipation, or diarrhea. She has no melena or hematochezia. She offers no further specific complaints today.  REVIEW OF SYSTEMS:   Review of Systems  Constitutional: Negative for fever, malaise/fatigue and weight loss.  HENT: Negative.  Negative for congestion.   Respiratory: Negative for cough and shortness of breath.   Cardiovascular: Negative.  Negative for chest pain.  Gastrointestinal: Negative.  Negative for abdominal pain, blood in stool and melena.  Genitourinary: Negative for hematuria.  Musculoskeletal: Negative.   Neurological: Negative for weakness.  Endo/Heme/Allergies: Does not bruise/bleed easily.  Psychiatric/Behavioral: Negative.  The patient is not nervous/anxious.     As per HPI. Otherwise, a complete review of systems is negative.  PAST MEDICAL HISTORY: Past Medical History:  Diagnosis Date  . Chronic kidney disease   . Urinary (tract) obstruction     PAST SURGICAL HISTORY: Past Surgical History:  Procedure Laterality Date  . ABDOMINAL HYSTERECTOMY    . APPENDECTOMY    . BACK SURGERY    . CHOLECYSTECTOMY    . PERIPHERAL VASCULAR CATHETERIZATION  10/02/2014   Procedure: PICC Line Insertion;  Surgeon: Annice Needy, MD;  Location: [facility] INVASIVE CV LAB;  Service: Cardiovascular;;  .  TONSILLECTOMY Bilateral     FAMILY HISTORY: Family History  Problem Relation Age of Onset  . Heart Problems Brother        ADVANCED DIRECTIVES:    HEALTH MAINTENANCE: Social History  Substance Use Topics  . Smoking status: Never Smoker  . Smokeless tobacco: Never Used  . Alcohol use No     Colonoscopy:  PAP:  Bone density:  Lipid panel:  Allergies  Allergen Reactions  . Other     Pork- religion contradiction    . Sulfa Antibiotics   . Tape     Other reaction(s): Other (See Comments) PLASTIC MEDICAL TAPE, Unknown Kind of Tape    Current Outpatient Prescriptions  Medication Sig Dispense Refill  . celecoxib (CELEBREX) 200 MG capsule     . ciprofloxacin (CIPRO) 500 MG tablet Take 1 tablet (500 mg total) by mouth 2 (two) times daily. 14 tablet 0  . cyanocobalamin (,VITAMIN B-12,) 1000 MCG/ML injection INJECT 1 ML WEEKLY FOR 3 WEEKS AS DIRECTED AND THEN ONCE A MONTH FOR ANEMIA    . nitrofurantoin (MACRODANTIN) 100 MG capsule Take 1 capsule (100 mg total) by mouth 4 (four) times daily. 28 capsule 0  . omeprazole (PRILOSEC) 40 MG capsule     . oxyCODONE (OXY IR/ROXICODONE) 5 MG immediate release tablet Take 1 tablet (5 mg total) by mouth every 6 (six) hours as needed for severe pain. 6 tablet 0  . furosemide (LASIX) 20 MG tablet Take 1 tablet (20 mg total) by mouth daily. (Patient not taking: Reported on 04/13/2016) 5 tablet 0  . ondansetron (ZOFRAN) 4 MG tablet Take 1 tablet (4 mg total) by mouth every 8 (eight) hours as needed  for nausea or vomiting. (Patient not taking: Reported on 12/13/2015) 21 tablet 0  . potassium chloride (K-DUR) 10 MEQ tablet Take 2 tablets (20 mEq total) by mouth daily. (Patient not taking: Reported on 04/13/2016) 10 tablet 0   No current facility-administered medications for this visit.     OBJECTIVE: Vitals:   04/13/16 1427  BP: 98/66  Pulse: 81  Temp: 98 F (36.7 C)     Body mass index is 25.09 kg/m.    ECOG FS:0 - Asymptomatic  General:  Well-developed, well-nourished, no acute distress. Eyes: Pink conjunctiva, anicteric sclera. Lungs: Clear to auscultation bilaterally. Heart: Regular rate and rhythm. No rubs, murmurs, or gallops. Abdomen: Soft, nontender, nondistended. No organomegaly noted, normoactive bowel sounds. Musculoskeletal: No edema, cyanosis, or clubbing. Neuro: Alert, answering all questions appropriately. Cranial nerves grossly intact. Skin: No rashes or petechiae noted. No ecchymosis noted. Psych: Normal affect.   LAB RESULTS:  Lab Results  Component Value Date   NA 140 02/14/2016   K 3.0 (L) 02/14/2016   CL 103 02/14/2016   CO2 32 02/14/2016   GLUCOSE 99 02/14/2016   BUN 14 02/14/2016   CREATININE 0.70 02/14/2016   CALCIUM 8.0 (L) 02/14/2016   PROT 6.2 (L) 02/14/2016   ALBUMIN 3.7 02/14/2016   AST 26 02/14/2016   ALT 20 02/14/2016   ALKPHOS 64 02/14/2016   BILITOT 0.5 02/14/2016   GFRNONAA >60 02/14/2016   GFRAA >60 02/14/2016    Lab Results  Component Value Date   WBC 5.4 04/10/2016   NEUTROABS 2.7 04/10/2016   HGB 12.7 04/10/2016   HCT 35.7 04/10/2016   MCV 84.1 04/10/2016   PLT 211 04/10/2016   Lab Results  Component Value Date   IRON 103 04/10/2016   TIBC 396 04/10/2016   IRONPCTSAT 26 04/10/2016   Lab Results  Component Value Date   FERRITIN 25 04/10/2016     STUDIES: No results found.  ASSESSMENT: Iron deficiency anemia, easy bruising.  PLAN:    1. Iron deficiency anemia: Patient's hemoglobin and iron stores Continue to be within normal limits. Previously, the remainder of her laboratory work, including hemoglobinopathy profile, were either negative or within normal limits. She does not require additional IV iron today. Patient last received Feraheme in August 2017. Return to clinic in 6 months for repeat laboratory work and further evaluation. If her iron stores remain within normal limits at that time, she likely can be discharged from clinic. 2. Easy bruising:  Patient's platelet count, PTT, and PT are all within normal limits. Platelet function assay and von Willebrand's panel are also within normal limits. Patient has no obvious underlying etiology of her bruising. Continue to monitor. 3. Hypotension: Patient is asymptomatic, monitor.  Patient expressed understanding and was in agreement with this plan. She also understands that She can call clinic at any time with any questions, concerns, or complaints.    [name], MD   04/20/2016 1:21 AM

## 2016-04-13 ENCOUNTER — Inpatient Hospital Stay (HOSPITAL_BASED_OUTPATIENT_CLINIC_OR_DEPARTMENT_OTHER): Payer: BLUE CROSS/BLUE SHIELD | Admitting: Oncology

## 2016-04-13 ENCOUNTER — Inpatient Hospital Stay: Payer: BLUE CROSS/BLUE SHIELD

## 2016-04-13 VITALS — BP 98/66 | HR 81 | Temp 98.0°F | Wt 165.0 lb

## 2016-04-13 DIAGNOSIS — D509 Iron deficiency anemia, unspecified: Secondary | ICD-10-CM

## 2016-04-13 DIAGNOSIS — N189 Chronic kidney disease, unspecified: Secondary | ICD-10-CM | POA: Diagnosis not present

## 2016-04-13 DIAGNOSIS — Z79899 Other long term (current) drug therapy: Secondary | ICD-10-CM | POA: Diagnosis not present

## 2016-04-13 DIAGNOSIS — I959 Hypotension, unspecified: Secondary | ICD-10-CM

## 2016-04-13 DIAGNOSIS — M7981 Nontraumatic hematoma of soft tissue: Secondary | ICD-10-CM

## 2016-04-13 NOTE — Progress Notes (Signed)
Patient states she feels fatigued.  Currently on antibiotics.  No SOB.  States she is not sleeping. Appetite okay.

## 2016-06-02 ENCOUNTER — Encounter: Payer: Self-pay | Admitting: Emergency Medicine

## 2016-06-02 ENCOUNTER — Emergency Department
Admission: EM | Admit: 2016-06-02 | Discharge: 2016-06-03 | Disposition: A | Discharge status: Home / Self Care | Payer: BLUE CROSS/BLUE SHIELD | Attending: Emergency Medicine | Admitting: Emergency Medicine

## 2016-06-02 DIAGNOSIS — M62838 Other muscle spasm: Secondary | ICD-10-CM

## 2016-06-02 DIAGNOSIS — R531 Weakness: Secondary | ICD-10-CM | POA: Insufficient documentation

## 2016-06-02 DIAGNOSIS — E860 Dehydration: Secondary | ICD-10-CM | POA: Insufficient documentation

## 2016-06-02 DIAGNOSIS — E162 Hypoglycemia, unspecified: Secondary | ICD-10-CM | POA: Diagnosis not present

## 2016-06-02 DIAGNOSIS — N189 Chronic kidney disease, unspecified: Secondary | ICD-10-CM | POA: Insufficient documentation

## 2016-06-02 DIAGNOSIS — Z79899 Other long term (current) drug therapy: Secondary | ICD-10-CM | POA: Insufficient documentation

## 2016-06-02 LAB — COMPREHENSIVE METABOLIC PANEL
ALBUMIN: 3.9 g/dL (ref 3.5–5.0)
ALT: 26 U/L (ref 14–54)
ANION GAP: 6 (ref 5–15)
AST: 38 U/L (ref 15–41)
Alkaline Phosphatase: 106 U/L (ref 38–126)
BUN: 17 mg/dL (ref 6–20)
CALCIUM: 8.7 mg/dL — AB (ref 8.9–10.3)
CHLORIDE: 108 mmol/L (ref 101–111)
CO2: 27 mmol/L (ref 22–32)
Creatinine, Ser: 0.91 mg/dL (ref 0.44–1.00)
GFR calc non Af Amer: >60 mL/min (ref >60)
GLUCOSE: 60 mg/dL — AB (ref 65–99)
POTASSIUM: 3.3 mmol/L — AB (ref 3.5–5.1)
SODIUM: 141 mmol/L (ref 135–145)
Total Bilirubin: 0.7 mg/dL (ref 0.3–1.2)
Total Protein: 6.6 g/dL (ref 6.5–8.1)

## 2016-06-02 LAB — URINALYSIS, COMPLETE (UACMP) WITH MICROSCOPIC
BILIRUBIN URINE: NEGATIVE
Glucose, UA: NEGATIVE mg/dL
HGB URINE DIPSTICK: NEGATIVE
KETONES UR: NEGATIVE mg/dL
NITRITE: NEGATIVE
Protein, ur: NEGATIVE mg/dL
SPECIFIC GRAVITY, URINE: 1.003 — AB (ref 1.005–1.030)
pH: 6 (ref 5.0–8.0)

## 2016-06-02 LAB — CBC
HCT: 35.4 % (ref 35.0–47.0)
Hemoglobin: 12.3 g/dL (ref 12.0–16.0)
MCH: 29.1 pg (ref 26.0–34.0)
MCHC: 34.7 g/dL (ref 32.0–36.0)
MCV: 83.9 fL (ref 80.0–100.0)
PLATELETS: 216 10*3/uL (ref 150–440)
RBC: 4.22 MIL/uL (ref 3.80–5.20)
RDW: 12.8 % (ref 11.5–14.5)
WBC: 8.6 10*3/uL (ref 3.6–11.0)

## 2016-06-02 LAB — TROPONIN I: Troponin I: <0.03 ng/mL (ref <0.03)

## 2016-06-02 LAB — PHOSPHORUS: Phosphorus: 1.6 mg/dL — ABNORMAL LOW (ref 2.5–4.6)

## 2016-06-02 LAB — MAGNESIUM: Magnesium: 2 mg/dL (ref 1.7–2.4)

## 2016-06-02 MED ORDER — LORAZEPAM 2 MG/ML IJ SOLN
0.5000 mg | Freq: Once | INTRAMUSCULAR | Status: AC
  Administered 2016-06-02: 0.5 mg via INTRAVENOUS
  Filled 2016-06-02: qty 1

## 2016-06-02 MED ORDER — SODIUM CHLORIDE 0.9 % IV BOLUS (SEPSIS)
1000.0000 mL | Freq: Once | INTRAVENOUS | Status: AC
  Administered 2016-06-02: 1000 mL via INTRAVENOUS

## 2016-06-02 NOTE — ED Notes (Signed)
Assited pt to toilet

## 2016-06-02 NOTE — ED Notes (Signed)
Pt. And pt. Family reports extremity cramps, nausea and HA that started around 9 pm tonight.  Pt. Reports fasting for the past 9 days.

## 2016-06-02 NOTE — ED Triage Notes (Signed)
Pt arrived to ED per EMS from home. EMS reports pt has been fasting x9 days, going 16 hours per day without food or fluid. Pt c/o of cramping in muscles, nausea and headaches.

## 2016-06-02 NOTE — ED Provider Notes (Signed)
[facility] Emergency Department Provider Note   ____________________________________________   First MD Initiated Contact with Patient 06/02/16 2304     (approximate)  I have reviewed the triage vital signs and the nursing notes.   HISTORY  Chief Complaint Weakness    HPI Vickie Turner is a 46 y.o. female who comes into the hospital today with muscle spasms in her feet and legs. The patient has been fasting and her spouse thinks that she is severely dehydrated. The patient has been nauseous as well as having these lower extremity muscle spasms. The patient has been fasting for approximately 9 days. She doesn't eat or drink for about 16 hours per day. The patient also did not break her fast today. He reports that the patient has been very active today and was in and out all day. The patient reports that she has not urinated at all today. The patient has had some spasms in the past but nothing this severe. She has also had low potassium in the past. The patient rates her pain a 50 out of 10 in intensity. She can usually tolerate the pain but this time the spasms are too intense. The patient denies any other complaints at this time. She is here today for evaluation.   Past Medical History:  Diagnosis Date  . Chronic kidney disease   . Urinary (tract) obstruction     Patient Active Problem List   Diagnosis Date Noted  . Iron deficiency anemia 08/09/2015  . Easy bruising 08/09/2015  . SOB (shortness of breath) 11/14/2013  . Palpitations 11/14/2013    Past Surgical History:  Procedure Laterality Date  . ABDOMINAL HYSTERECTOMY    . APPENDECTOMY    . BACK SURGERY    . CHOLECYSTECTOMY    . PERIPHERAL VASCULAR CATHETERIZATION  10/02/2014   Procedure: PICC Line Insertion;  Surgeon: Annice Needy, MD;  Location: [facility] INVASIVE CV LAB;  Service: Cardiovascular;;  . TONSILLECTOMY Bilateral     Prior to Admission medications   Medication Sig Start Date End  Date Taking? Authorizing Provider  celecoxib (CELEBREX) 200 MG capsule  02/24/15   [provider]  ciprofloxacin (CIPRO) 500 MG tablet Take 1 tablet (500 mg total) by mouth 2 (two) times daily. 02/14/16   Sharyn Creamer, MD  cyanocobalamin (,VITAMIN B-12,) 1000 MCG/ML injection INJECT 1 ML WEEKLY FOR 3 WEEKS AS DIRECTED AND THEN ONCE A MONTH FOR ANEMIA 06/07/14   [provider]  diazepam (VALIUM) 2 MG tablet Take 1 tablet (2 mg total) by mouth every 6 (six) hours as needed for anxiety. 06/03/16   Rebecka Apley, MD  furosemide (LASIX) 20 MG tablet Take 1 tablet (20 mg total) by mouth daily. Patient not taking: Reported on 04/13/2016 02/14/16 02/19/16  Sharyn Creamer, MD  nitrofurantoin (MACRODANTIN) 100 MG capsule Take 1 capsule (100 mg total) by mouth 4 (four) times daily. 05/15/15   Jennye Moccasin, MD  omeprazole (PRILOSEC) 40 MG capsule  11/13/12   [provider]  ondansetron (ZOFRAN) 4 MG tablet Take 1 tablet (4 mg total) by mouth every 8 (eight) hours as needed for nausea or vomiting. Patient not taking: Reported on 12/13/2015 05/15/15   Jennye Moccasin, MD  oxyCODONE (OXY IR/ROXICODONE) 5 MG immediate release tablet Take 1 tablet (5 mg total) by mouth every 6 (six) hours as needed for severe pain. 02/14/16   Sharyn Creamer, MD  potassium chloride (K-DUR) 10 MEQ tablet Take 2 tablets (20 mEq total)  by mouth daily. Patient not taking: Reported on 04/13/2016 02/14/16 02/19/16  Sharyn CreamerQuale, Mark, MD    Allergies Other; Sulfa antibiotics; and Tape  Family History  Problem Relation Age of Onset  . Heart Problems Brother     Social History Social History  Substance Use Topics  . Smoking status: Never Smoker  . Smokeless tobacco: Never Used  . Alcohol use No    Review of Systems  Constitutional: No fever/chills Eyes: No visual changes. ENT: No sore throat. Cardiovascular: Denies chest pain. Respiratory: Denies shortness of breath. Gastrointestinal: Nausea with No abdominal pain,  no vomiting.  No diarrhea.  No constipation. Genitourinary: Negative for dysuria. Musculoskeletal: Muscle spasms Skin: Negative for rash. Neurological: Generalized weakness   ____________________________________________   PHYSICAL EXAM:  VITAL SIGNS: ED Triage Vitals  Enc Vitals Group     BP 06/02/16 2258 136/74     Pulse Rate 06/02/16 2258 86     Resp 06/02/16 2258 16     Temp 06/02/16 2258 98 F (36.7 C)     Temp Source 06/02/16 2258 Oral     SpO2 06/02/16 2252 97 %     Weight 06/02/16 2253 165 lb (74.8 kg)     Height --      Head Circumference --      Peak Flow --      Pain Score --      Pain Loc --      Pain Edu? --      Excl. in GC? --     Constitutional: Alert and oriented. Well appearing and in Moderate distress. Eyes: Conjunctivae are normal. PERRL. EOMI. Head: Atraumatic. Nose: No congestion/rhinnorhea. Mouth/Throat: Mucous membranes are moist.  Oropharynx non-erythematous. Cardiovascular: Normal rate, regular rhythm. Grossly normal heart sounds.  Good peripheral circulation. Respiratory: Normal respiratory effort.  No retractions. Lungs CTAB. Gastrointestinal: Soft and nontender. No distention. Positive bowel sounds Musculoskeletal: No lower extremity tenderness nor edema.   Neurologic:  Normal speech and language.  Skin:  Skin is warm, dry and intact.  Psychiatric: Mood and affect are normal.   ____________________________________________   LABS (all labs ordered are listed, but only abnormal results are displayed)  Labs Reviewed  COMPREHENSIVE METABOLIC PANEL - Abnormal; Notable for the following:       Result Value   Potassium 3.3 (*)    Glucose, Bld 60 (*)    Calcium 8.7 (*)    All other components within normal limits  URINALYSIS, COMPLETE (UACMP) WITH MICROSCOPIC - Abnormal; Notable for the following:    Color, Urine STRAW (*)    APPearance CLEAR (*)    Specific Gravity, Urine 1.003 (*)    Leukocytes, UA TRACE (*)    Bacteria, UA RARE (*)     Squamous Epithelial / LPF 0-5 (*)    All other components within normal limits  PHOSPHORUS - Abnormal; Notable for the following:    Phosphorus 1.6 (*)    All other components within normal limits  CBC  TROPONIN I  MAGNESIUM   ____________________________________________  EKG  nonw ____________________________________________  RADIOLOGY  none ____________________________________________   PROCEDURES  Procedure(s) performed: None  Procedures  Critical Care performed: No  ____________________________________________   INITIAL IMPRESSION / ASSESSMENT AND PLAN / ED COURSE  Pertinent labs & imaging results that were available during my care of the patient were reviewed by me and considered in my medical decision making (see chart for details).  This is a 46 year old female who comes into the hospital today with some  muscle spasms, weakness and nausea. The patient has been fasting for multiple days and reports that she has not urinated all day. The patient also has a history of hypokalemia and kidney disease. I will give the patient some fluids while I wait the results of her blood work. I will then reassess the patient.     The patient received 2 L of normal saline. Her phosphorus is low at 1.6 but that is likely from her not eating. She was able to drink some juice in the emergency department and after some Ativan for muscle spasms that improved. The patient's creatinine is 0.91. It is higher than it has been in the past but it is not severely elevated. I will discharge the patient home to have her follow-up with her primary care physician. Otherwise the patient has no further complaints or concerns at this time. ____________________________________________   FINAL CLINICAL IMPRESSION(S) / ED DIAGNOSES  Final diagnoses:  Hypophosphatemia  Dehydration  Hypoglycemia  Generalized weakness  Muscle spasms of both lower extremities      NEW MEDICATIONS STARTED  DURING THIS VISIT:  New Prescriptions   DIAZEPAM (VALIUM) 2 MG TABLET    Take 1 tablet (2 mg total) by mouth every 6 (six) hours as needed for anxiety.     Note:  This document was prepared using Dragon voice recognition software and may include unintentional dictation errors.    Rebecka Apley, MD 06/03/16 [phone]

## 2016-06-03 LAB — GLUCOSE, CAPILLARY: Glucose-Capillary: 129 mg/dL — ABNORMAL HIGH (ref 65–99)

## 2016-06-03 MED ORDER — DIAZEPAM 2 MG PO TABS: 2.0000 mg | ORAL_TABLET | Freq: Four times a day (QID) | ORAL | 0 refills | Status: DC | PRN

## 2016-06-03 MED ORDER — ONDANSETRON HCL 4 MG/2ML IJ SOLN
4.0000 mg | Freq: Once | INTRAMUSCULAR | Status: AC
  Administered 2016-06-03: 4 mg via INTRAVENOUS
  Filled 2016-06-03: qty 2

## 2016-06-03 NOTE — ED Notes (Signed)
Blood sugar was 129

## 2016-06-03 NOTE — Discharge Instructions (Signed)
I feel that her symptoms were due to your fasting. Please resume eating onion normal schedule. Please follow-up with your primary care physician.

## 2016-06-03 NOTE — ED Notes (Signed)
Pt. Given crackers, peanut butter and apple juice in increase Glucose level.

## 2016-06-29 ENCOUNTER — Ambulatory Visit: Payer: Self-pay | Admitting: Obstetrics & Gynecology

## 2016-07-24 ENCOUNTER — Encounter: Payer: Self-pay | Admitting: Obstetrics & Gynecology

## 2016-07-24 ENCOUNTER — Ambulatory Visit (INDEPENDENT_AMBULATORY_CARE_PROVIDER_SITE_OTHER): Payer: BLUE CROSS/BLUE SHIELD | Admitting: Obstetrics & Gynecology

## 2016-07-24 VITALS — BP 90/60 | HR 68 | Ht 68.0 in | Wt 170.0 lb

## 2016-07-24 DIAGNOSIS — Z1231 Encounter for screening mammogram for malignant neoplasm of breast: Secondary | ICD-10-CM | POA: Diagnosis not present

## 2016-07-24 DIAGNOSIS — N870 Mild cervical dysplasia: Secondary | ICD-10-CM | POA: Insufficient documentation

## 2016-07-24 DIAGNOSIS — Z1239 Encounter for other screening for malignant neoplasm of breast: Secondary | ICD-10-CM

## 2016-07-24 NOTE — Patient Instructions (Signed)
Cervical Dysplasia Cervical dysplasia is a condition in which a woman's cervix cells have abnormal changes. The cervix is the opening of the uterus (womb). It is located between the vagina and the uterus. Cervical dysplasia may be an early sign of cervical cancer. If left untreated, this condition may become more severe and may progress to cervical cancer. Early detection, treatment, and follow-up care are very important. What are the causes? Cervical dysplasia can be caused by a human papillomavirus (HPV) infection. HPV is the most common sexually transmitted infection (STI). HPV is spread from person to person through sexual contact. This includes oral, vaginal, or anal sex. What increases the risk? The following factors may make you more likely to develop this condition:  Having had a sexually transmitted infection (STI), such as herpes, chlamydia or HPV.  Becoming sexually active before age 18.  Having had more than one sexual partner.  Having a sexual partner who has multiple sexual partners.  Not using protection, such as a condom, during sex, especially with new sexual partners.  Having a history of cancer of the vagina or vulva.  Having a weakened body defense (immune) system.  Being the daughter of a woman who took diethylstilbestrol (DES) during pregnancy.  Having a family history of cervical cancer.  Smoking.  Using oral contraceptives, also called birth control pills.  Having had three or more full-term pregnancies.  What are the signs or symptoms? There are usually no symptoms of this condition. If you do have symptoms, they may include:  Abnormal vaginal discharge.  Bleeding between periods or after sex.  Bleeding during menopause.  Pain during sex (dyspareunia).  How is this diagnosed? A test called a Pap test may be done. During this test, cells are taken from the cervix and then examined under a microscope. A test in which tissue is removed from the cervix  (biopsy) may also be done if the Pap test is abnormal or if the cervix looks abnormal. How is this treated? Treatment varies based on the severity of the condition. Treatment may include:  Cryotherapy. During cryotherapy, the abnormal cells are frozen with a steel-tip instrument.  Loop electrosurgical excision procedure (LEEP). This procedure removes abnormal tissue from the cervix.  Surgery to remove abnormal tissue. This is usually done in more advanced cases. Surgical options include: ? A cone biopsy. This is a procedure in which the cervical canal and a portion of the center of the cervix are removed. ? Hysterectomy. This is a surgery in which the uterus and cervix are removed.  Follow these instructions at home:  Take over-the-counter and prescription medicines only as told by your health care provider.  Do not use tampons, have sex, or douche until your health care provider says it is okay.  Keep follow-up visits as told by your health care provider. This is important. Women who have been treated for cervical dysplasia should have regular pelvic exams and Pap tests as told by their health care provider. How is this prevented?  Practice safe sex to help prevent sexually transmitted infections (STI) that may cause this condition.  Have regular Pap tests. Talk with your health care provider about how often you need these tests. Pap tests will help identify cell changes that can lead to cancer. Contact a health care provider if:  You develop genital warts. Get help right away if:  You have a fever.  You have abnormal vaginal discharge.  Your menstrual period is heavier than normal.  You develop bright   red bleeding. This may include blood clots.  You have pain or cramps that get worse, and medicine does not help to relieve your pain.  You feel light-headed and you are unusually weak.  You have fainting spells.  You have pain in the abdomen. Summary  Cervical dysplasia  is a condition in which a woman's cervix cells have abnormal changes.  If left untreated, this condition may become more severe and may progress to cervical cancer.  Early detection, treatment, and follow-up care are very important in managing this condition.  Have regular pelvic exams and Pap tests. Talk with your health care provider about how often you need these tests. Pap tests will help identify cell changes that can lead to cancer. This information is not intended to replace advice given to you by your health care provider. Make sure you discuss any questions you have with your health care provider. Document Released: 12/26/2004 Document Revised: 12/30/2015 Document Reviewed: 12/30/2015 Elsevier Interactive Patient Education  2017 [employer]  

## 2016-07-24 NOTE — Progress Notes (Signed)
  HPI:  Patient is a 46 y.o. No obstetric history on file. presenting for follow up evaluation of abnormal PAP smear in the past.  Her last PAP was 6 months ago and was abnormal: LGSIL. She has had a prior colposcopy. Prior biopsies (if done) were CIN I.  Prior [facility].   PAP 2011, 2013 normal w neg HPV.  PMHx: She  has a past medical history of Chronic kidney disease and Urinary (tract) obstruction. Also,  has a past surgical history that includes Appendectomy; Back surgery; Cholecystectomy; Abdominal hysterectomy; Tonsillectomy (Bilateral); and Cardiac catheterization (10/02/2014)., family history includes Heart Problems in her brother.,  reports that she has never smoked. She has never used smokeless tobacco. She reports that she does not drink alcohol or use drugs.  She has a current medication list which includes the following prescription(s): celecoxib, cyanocobalamin, diazepam, nitrofurantoin, omeprazole, ondansetron, oxycodone, and potassium chloride. Also, is allergic to sulfa antibiotics; other; and tape.  Review of Systems  All other systems reviewed and are negative.   Objective: BP 90/60   Pulse 68   Ht 5\' 8"  (1.727 m)   Wt 170 lb (77.1 kg)   BMI 25.85 kg/m  Filed Weights   07/24/16 0815  Weight: 170 lb (77.1 kg)   Body mass index is 25.85 kg/m.  Physical examination Physical Exam  Constitutional: She is oriented to person, place, and time. She appears well-developed and well-nourished. No distress.  Genitourinary: Vagina normal and uterus normal. Pelvic exam was performed with patient supine. There is no rash, tenderness or lesion on the right labia. There is no rash, tenderness or lesion on the left labia. No erythema or bleeding in the vagina. Right adnexum does not display mass and does not display tenderness. Left adnexum does not display mass and does not display tenderness. Cervix does not exhibit motion tenderness, discharge, polyp or nabothian cyst.   Uterus is mobile  and midaxial. Uterus is not enlarged or exhibiting a mass.  Abdominal: Soft. She exhibits no distension. There is no tenderness.  Musculoskeletal: Normal range of motion.  Neurological: She is alert and oriented to person, place, and time. No cranial nerve deficit.  Skin: Skin is warm and dry.  Psychiatric: She has a normal mood and affect.    ASSESSMENT:  History of Cervical Dysplasia, CIN I  Plan:  1.  I discussed the grading system of pap smears and HPV high risk viral types.   2. Follow up PAP 6 months, vs intervention if high grade dysplasia identified. 3. Treatment of persistantly abnormal PAP smears and cervical dysplasia, even mild, is discussed w pt today in detail, as well as the pros and cons of Cryo and LEEP procedures. Will consider and discuss after results.  Call w results  Also sch MMG as is needed, pt agrees to comply  Annamarie MajorPaul Siren Porrata, MD, Merlinda FrederickFACOG [facility], [facility] 07/24/2016  8:18 AM

## 2016-07-28 LAB — PAP IG (IMAGE GUIDED): PAP Smear Comment: 0

## 2016-07-31 ENCOUNTER — Other Ambulatory Visit: Payer: Self-pay | Admitting: Otolaryngology

## 2016-07-31 DIAGNOSIS — H905 Unspecified sensorineural hearing loss: Secondary | ICD-10-CM

## 2016-08-05 ENCOUNTER — Ambulatory Visit
Admission: RE | Admit: 2016-08-05 | Discharge: 2016-08-05 | Disposition: A | Discharge status: Home / Self Care | Payer: BLUE CROSS/BLUE SHIELD | Source: Ambulatory Visit | Attending: Otolaryngology | Admitting: Otolaryngology

## 2016-08-05 DIAGNOSIS — H905 Unspecified sensorineural hearing loss: Secondary | ICD-10-CM

## 2016-08-05 DIAGNOSIS — H9042 Sensorineural hearing loss, unilateral, left ear, with unrestricted hearing on the contralateral side: Secondary | ICD-10-CM | POA: Insufficient documentation

## 2016-08-05 MED ORDER — GADOBENATE DIMEGLUMINE 529 MG/ML IV SOLN
15.0000 mL | Freq: Once | INTRAVENOUS | Status: AC | PRN
  Administered 2016-08-05: 15 mL via INTRAVENOUS

## 2016-09-08 ENCOUNTER — Encounter: Payer: Self-pay | Admitting: Emergency Medicine

## 2016-09-08 ENCOUNTER — Emergency Department: Payer: BLUE CROSS/BLUE SHIELD

## 2016-09-08 ENCOUNTER — Emergency Department
Admission: EM | Admit: 2016-09-08 | Discharge: 2016-09-08 | Disposition: A | Discharge status: Home / Self Care | Payer: BLUE CROSS/BLUE SHIELD | Attending: Emergency Medicine | Admitting: Emergency Medicine

## 2016-09-08 DIAGNOSIS — R1110 Vomiting, unspecified: Secondary | ICD-10-CM | POA: Diagnosis not present

## 2016-09-08 DIAGNOSIS — Y998 Other external cause status: Secondary | ICD-10-CM | POA: Insufficient documentation

## 2016-09-08 DIAGNOSIS — M25512 Pain in left shoulder: Secondary | ICD-10-CM | POA: Insufficient documentation

## 2016-09-08 DIAGNOSIS — Y9389 Activity, other specified: Secondary | ICD-10-CM | POA: Insufficient documentation

## 2016-09-08 DIAGNOSIS — M545 Low back pain: Secondary | ICD-10-CM | POA: Diagnosis not present

## 2016-09-08 DIAGNOSIS — Y929 Unspecified place or not applicable: Secondary | ICD-10-CM | POA: Insufficient documentation

## 2016-09-08 DIAGNOSIS — M25511 Pain in right shoulder: Secondary | ICD-10-CM | POA: Insufficient documentation

## 2016-09-08 DIAGNOSIS — Z79899 Other long term (current) drug therapy: Secondary | ICD-10-CM | POA: Diagnosis not present

## 2016-09-08 DIAGNOSIS — N189 Chronic kidney disease, unspecified: Secondary | ICD-10-CM | POA: Insufficient documentation

## 2016-09-08 DIAGNOSIS — R0602 Shortness of breath: Secondary | ICD-10-CM | POA: Diagnosis not present

## 2016-09-08 DIAGNOSIS — R51 Headache: Secondary | ICD-10-CM | POA: Insufficient documentation

## 2016-09-08 DIAGNOSIS — M25561 Pain in right knee: Secondary | ICD-10-CM | POA: Insufficient documentation

## 2016-09-08 LAB — CBC WITH DIFFERENTIAL/PLATELET
Basophils Absolute: 0 10*3/uL (ref 0–0.1)
Basophils Relative: 0 %
Eosinophils Absolute: 0.1 10*3/uL (ref 0–0.7)
Eosinophils Relative: 1 %
HCT: 38.8 % (ref 35.0–47.0)
Hemoglobin: 13.5 g/dL (ref 12.0–16.0)
Lymphocytes Relative: 42 %
Lymphs Abs: 4.9 10*3/uL — ABNORMAL HIGH (ref 1.0–3.6)
MCH: 28.6 pg (ref 26.0–34.0)
MCHC: 34.7 g/dL (ref 32.0–36.0)
MCV: 82.4 fL (ref 80.0–100.0)
Monocytes Absolute: 0.5 10*3/uL (ref 0.2–0.9)
Monocytes Relative: 5 %
Neutro Abs: 6.3 10*3/uL (ref 1.4–6.5)
Neutrophils Relative %: 52 %
Platelets: 248 10*3/uL (ref 150–440)
RBC: 4.71 MIL/uL (ref 3.80–5.20)
RDW: 12.8 % (ref 11.5–14.5)
WBC: 11.9 10*3/uL — ABNORMAL HIGH (ref 3.6–11.0)

## 2016-09-08 LAB — COMPREHENSIVE METABOLIC PANEL
ALT: 42 U/L (ref 14–54)
AST: 54 U/L — ABNORMAL HIGH (ref 15–41)
Albumin: 3.8 g/dL (ref 3.5–5.0)
Alkaline Phosphatase: 172 U/L — ABNORMAL HIGH (ref 38–126)
Anion gap: 9 (ref 5–15)
BUN: 13 mg/dL (ref 6–20)
CO2: 24 mmol/L (ref 22–32)
Calcium: 8.9 mg/dL (ref 8.9–10.3)
Chloride: 106 mmol/L (ref 101–111)
Creatinine, Ser: 0.64 mg/dL (ref 0.44–1.00)
GFR calc Af Amer: >60 mL/min (ref >60)
GFR calc non Af Amer: >60 mL/min (ref >60)
Glucose, Bld: 118 mg/dL — ABNORMAL HIGH (ref 65–99)
Potassium: 3.3 mmol/L — ABNORMAL LOW (ref 3.5–5.1)
Sodium: 139 mmol/L (ref 135–145)
Total Bilirubin: 0.7 mg/dL (ref 0.3–1.2)
Total Protein: 6.6 g/dL (ref 6.5–8.1)

## 2016-09-08 MED ORDER — ONDANSETRON HCL 4 MG PO TABS: 4.0000 mg | ORAL_TABLET | Freq: Three times a day (TID) | ORAL | 1 refills | Status: AC | PRN

## 2016-09-08 MED ORDER — ONDANSETRON HCL 4 MG/2ML IJ SOLN
INTRAMUSCULAR | Status: AC
  Administered 2016-09-08: 4 mg via INTRAVENOUS
  Filled 2016-09-08: qty 2

## 2016-09-08 MED ORDER — MELOXICAM 7.5 MG PO TABS: 7.5000 mg | ORAL_TABLET | Freq: Every day | ORAL | 1 refills | Status: AC

## 2016-09-08 MED ORDER — PROMETHAZINE HCL 25 MG/ML IJ SOLN
12.5000 mg | Freq: Once | INTRAMUSCULAR | Status: AC
  Administered 2016-09-08: 12.5 mg via INTRAVENOUS
  Filled 2016-09-08: qty 1

## 2016-09-08 MED ORDER — ONDANSETRON HCL 4 MG/2ML IJ SOLN
4.0000 mg | Freq: Once | INTRAMUSCULAR | Status: AC
  Administered 2016-09-08: 4 mg via INTRAVENOUS

## 2016-09-08 MED ORDER — OXYCODONE-ACETAMINOPHEN 5-325 MG PO TABS: 1.0000 | ORAL_TABLET | Freq: Three times a day (TID) | ORAL | 0 refills | Status: AC | PRN

## 2016-09-08 MED ORDER — OXYCODONE-ACETAMINOPHEN 5-325 MG PO TABS
1.0000 | ORAL_TABLET | Freq: Once | ORAL | Status: AC
  Administered 2016-09-08: 1 via ORAL
  Filled 2016-09-08: qty 1

## 2016-09-08 NOTE — ED Notes (Addendum)
Per Pia MauJaclyn Woods PA, instructed to change patient's acuity to a level 2.  Ask her if she wanted patient moved to the Main ED out of POD D to be more closely monitored stated not at this time until patient's scans have resulted.  Informed Charge RN Judeth CornfieldStephanie of this.
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
## 2016-09-08 NOTE — ED Notes (Signed)
Patient taken to XR/CT
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
## 2016-10-15 NOTE — Progress Notes (Deleted)
Vickie Turner  [phone] [phone]  ID: Vickie Turner OB: 09-04-1970  MR#: [id number]  AOZ#:[id number]  Patient Care Team: Mick Sell, MD as PCP - General (Infectious Diseases)  CHIEF COMPLAINT: Iron deficiency anemia, easy bruising.  INTERVAL HISTORY: Patient returns to clinic today for repeat laboratory work, further evaluation, and consideration of additional IV iron. She continues to feel well and is asymptomatic. She denies any recent fevers or illnesses. She has no neurologic complaints. She has no chest pain, shortness breath, or hemoptysis. She has a good appetite and denies weight loss. She has no nausea, vomiting, constipation, or diarrhea. She has no melena or hematochezia. She offers no further specific complaints today.  REVIEW OF SYSTEMS:   Review of Systems  Constitutional: Negative for fever, malaise/fatigue and weight loss.  HENT: Negative.  Negative for congestion.   Respiratory: Negative for cough and shortness of breath.   Cardiovascular: Negative.  Negative for chest pain.  Gastrointestinal: Negative.  Negative for abdominal pain, blood in stool and melena.  Genitourinary: Negative for hematuria.  Musculoskeletal: Negative.   Neurological: Negative for weakness.  Endo/Heme/Allergies: Does not bruise/bleed easily.  Psychiatric/Behavioral: Negative.  The patient is not nervous/anxious.     As per HPI. Otherwise, a complete review of systems is negative.  PAST MEDICAL HISTORY: Past Medical History:  Diagnosis Date  . Chronic kidney disease   . Urinary (tract) obstruction     PAST SURGICAL HISTORY: Past Surgical History:  Procedure Laterality Date  . ABDOMINAL HYSTERECTOMY    . APPENDECTOMY    . BACK SURGERY    . CHOLECYSTECTOMY    . PERIPHERAL VASCULAR CATHETERIZATION  10/02/2014   Procedure: PICC Line Insertion;  Surgeon: Annice Needy, MD;  Location: [facility] INVASIVE CV LAB;  Service: Cardiovascular;;  .  TONSILLECTOMY Bilateral     FAMILY HISTORY: Family History  Problem Relation Age of Onset  . Heart Problems Brother        ADVANCED DIRECTIVES:    HEALTH MAINTENANCE: Social History  Substance Use Topics  . Smoking status: Never Smoker  . Smokeless tobacco: Never Used  . Alcohol use No     Colonoscopy:  PAP:  Bone density:  Lipid panel:  Allergies  Allergen Reactions  . Sulfa Antibiotics Rash and Shortness Of Breath  . Other     Pork- religion contradiction    . Tape     Other reaction(s): Other (See Comments) PLASTIC MEDICAL TAPE, Unknown Kind of Tape    Current Outpatient Prescriptions  Medication Sig Dispense Refill  . celecoxib (CELEBREX) 200 MG capsule     . cyanocobalamin (,VITAMIN B-12,) 1000 MCG/ML injection INJECT 1 ML WEEKLY FOR 3 WEEKS AS DIRECTED AND THEN ONCE A MONTH FOR ANEMIA    . diazepam (VALIUM) 2 MG tablet Take 1 tablet (2 mg total) by mouth every 6 (six) hours as needed for anxiety. (Patient not taking: Reported on 07/24/2016) 12 tablet 0  . nitrofurantoin (MACRODANTIN) 100 MG capsule Take 1 capsule (100 mg total) by mouth 4 (four) times daily. 28 capsule 0  . omeprazole (PRILOSEC) 40 MG capsule     . oxyCODONE (OXY IR/ROXICODONE) 5 MG immediate release tablet Take 1 tablet (5 mg total) by mouth every 6 (six) hours as needed for severe pain. (Patient not taking: Reported on 07/24/2016) 6 tablet 0   No current facility-administered medications for this visit.     OBJECTIVE: There were no vitals filed for this visit.   There is  no height or weight on file to calculate BMI.    ECOG FS:0 - Asymptomatic  General: Well-developed, well-nourished, no acute distress. Eyes: Pink conjunctiva, anicteric sclera. Lungs: Clear to auscultation bilaterally. Heart: Regular rate and rhythm. No rubs, murmurs, or gallops. Abdomen: Soft, nontender, nondistended. No organomegaly noted, normoactive bowel sounds. Musculoskeletal: No edema, cyanosis, or  clubbing. Neuro: Alert, answering all questions appropriately. Cranial nerves grossly intact. Skin: No rashes or petechiae noted. No ecchymosis noted. Psych: Normal affect.   LAB RESULTS:  Lab Results  Component Value Date   NA 139 09/08/2016   K 3.3 (L) 09/08/2016   CL 106 09/08/2016   CO2 24 09/08/2016   GLUCOSE 118 (H) 09/08/2016   BUN 13 09/08/2016   CREATININE 0.64 09/08/2016   CALCIUM 8.9 09/08/2016   PROT 6.6 09/08/2016   ALBUMIN 3.8 09/08/2016   AST 54 (H) 09/08/2016   ALT 42 09/08/2016   ALKPHOS 172 (H) 09/08/2016   BILITOT 0.7 09/08/2016   GFRNONAA >60 09/08/2016   GFRAA >60 09/08/2016    Lab Results  Component Value Date   WBC 11.9 (H) 09/08/2016   NEUTROABS 6.3 09/08/2016   HGB 13.5 09/08/2016   HCT 38.8 09/08/2016   MCV 82.4 09/08/2016   PLT 248 09/08/2016   Lab Results  Component Value Date   IRON 103 04/10/2016   TIBC 396 04/10/2016   IRONPCTSAT 26 04/10/2016   Lab Results  Component Value Date   FERRITIN 25 04/10/2016     STUDIES: No results found.  ASSESSMENT: Iron deficiency anemia, easy bruising.  PLAN:    1. Iron deficiency anemia: Patient's hemoglobin and iron stores Continue to be within normal limits. Previously, the remainder of her laboratory work, including hemoglobinopathy profile, were either negative or within normal limits. She does not require additional IV iron today. Patient last received Feraheme in August 2017. Return to clinic in 6 months for repeat laboratory work and further evaluation. If her iron stores remain within normal limits at that time, she likely can be discharged from clinic. 2. Easy bruising: Patient's platelet count, PTT, and PT are all within normal limits. Platelet function assay and von Willebrand's panel are also within normal limits. Patient has no obvious underlying etiology of her bruising. Continue to monitor. 3. Hypotension: Patient is asymptomatic, monitor.  Patient expressed understanding and  was in agreement with this plan. She also understands that She can call clinic at any time with any questions, concerns, or complaints.    [name], MD   10/15/2016 9:44 AM

## 2016-10-18 ENCOUNTER — Inpatient Hospital Stay: Payer: BLUE CROSS/BLUE SHIELD

## 2016-10-18 ENCOUNTER — Inpatient Hospital Stay: Payer: BLUE CROSS/BLUE SHIELD | Admitting: Oncology

## 2016-10-24 NOTE — Progress Notes (Deleted)
[facility]  Telephone:([phone] Fax:([phone]  ID: Avarae Zwart Song OB: 05-19-1970  MR#: [id number]  AOZ#:[id number]  Patient Care Team: Mick Sell, MD as PCP - General (Infectious Diseases)  CHIEF COMPLAINT: Iron deficiency anemia, easy bruising.  INTERVAL HISTORY: Patient returns to clinic today for repeat laboratory work, further evaluation, and consideration of additional IV iron. She continues to feel well and is asymptomatic. She denies any recent fevers or illnesses. She has no neurologic complaints. She has no chest pain, shortness breath, or hemoptysis. She has a good appetite and denies weight loss. She has no nausea, vomiting, constipation, or diarrhea. She has no melena or hematochezia. She offers no further specific complaints today.  REVIEW OF SYSTEMS:   Review of Systems  Constitutional: Negative for fever, malaise/fatigue and weight loss.  HENT: Negative.  Negative for congestion.   Respiratory: Negative for cough and shortness of breath.   Cardiovascular: Negative.  Negative for chest pain.  Gastrointestinal: Negative.  Negative for abdominal pain, blood in stool and melena.  Genitourinary: Negative for hematuria.  Musculoskeletal: Negative.   Neurological: Negative for weakness.  Endo/Heme/Allergies: Does not bruise/bleed easily.  Psychiatric/Behavioral: Negative.  The patient is not nervous/anxious.     As per HPI. Otherwise, a complete review of systems is negative.  PAST MEDICAL HISTORY: Past Medical History:  Diagnosis Date  . Chronic kidney disease   . Urinary (tract) obstruction     PAST SURGICAL HISTORY: Past Surgical History:  Procedure Laterality Date  . ABDOMINAL HYSTERECTOMY    . APPENDECTOMY    . BACK SURGERY    . CHOLECYSTECTOMY    . PERIPHERAL VASCULAR CATHETERIZATION  10/02/2014   Procedure: PICC Line Insertion;  Surgeon: Annice Needy, MD;  Location: [facility];  Service: Cardiovascular;;  .  TONSILLECTOMY Bilateral     FAMILY HISTORY: Family History  Problem Relation Age of Onset  . Heart Problems Brother        ADVANCED DIRECTIVES:    HEALTH MAINTENANCE: Social History  Substance Use Topics  . Smoking status: Never Smoker  . Smokeless tobacco: Never Used  . Alcohol use No     Colonoscopy:  PAP:  Bone density:  Lipid panel:  Allergies  Allergen Reactions  . Sulfa Antibiotics Rash and Shortness Of Breath  . Other     Pork- religion contradiction    . Tape     Other reaction(s): Other (See Comments) PLASTIC MEDICAL TAPE, Unknown Kind of Tape    Current Outpatient Prescriptions  Medication Sig Dispense Refill  . celecoxib (CELEBREX) 200 MG capsule     . cyanocobalamin (,VITAMIN B-12,) 1000 MCG/ML injection INJECT 1 ML WEEKLY FOR 3 WEEKS AS DIRECTED AND THEN ONCE A MONTH FOR ANEMIA    . diazepam (VALIUM) 2 MG tablet Take 1 tablet (2 mg total) by mouth every 6 (six) hours as needed for anxiety. (Patient not taking: Reported on 07/24/2016) 12 tablet 0  . nitrofurantoin (MACRODANTIN) 100 MG capsule Take 1 capsule (100 mg total) by mouth 4 (four) times daily. 28 capsule 0  . omeprazole (PRILOSEC) 40 MG capsule     . oxyCODONE (OXY IR/ROXICODONE) 5 MG immediate release tablet Take 1 tablet (5 mg total) by mouth every 6 (six) hours as needed for severe pain. (Patient not taking: Reported on 07/24/2016) 6 tablet 0   No current facility-administered medications for this visit.     OBJECTIVE: There were no vitals filed for this visit.   There is  no height or weight on file to calculate BMI.    ECOG FS:0 - Asymptomatic  General: Well-developed, well-nourished, no acute distress. Eyes: Pink conjunctiva, anicteric sclera. Lungs: Clear to auscultation bilaterally. Heart: Regular rate and rhythm. No rubs, murmurs, or gallops. Abdomen: Soft, nontender, nondistended. No organomegaly noted, normoactive bowel sounds. Musculoskeletal: No edema, cyanosis, or  clubbing. Neuro: Alert, answering all questions appropriately. Cranial nerves grossly intact. Skin: No rashes or petechiae noted. No ecchymosis noted. Psych: Normal affect.   LAB RESULTS:  Lab Results  Component Value Date   NA 139 09/08/2016   K 3.3 (L) 09/08/2016   CL 106 09/08/2016   CO2 24 09/08/2016   GLUCOSE 118 (H) 09/08/2016   BUN 13 09/08/2016   CREATININE 0.64 09/08/2016   CALCIUM 8.9 09/08/2016   PROT 6.6 09/08/2016   ALBUMIN 3.8 09/08/2016   AST 54 (H) 09/08/2016   ALT 42 09/08/2016   ALKPHOS 172 (H) 09/08/2016   BILITOT 0.7 09/08/2016   GFRNONAA >60 09/08/2016   GFRAA >60 09/08/2016    Lab Results  Component Value Date   WBC 11.9 (H) 09/08/2016   NEUTROABS 6.3 09/08/2016   HGB 13.5 09/08/2016   HCT 38.8 09/08/2016   MCV 82.4 09/08/2016   PLT 248 09/08/2016   Lab Results  Component Value Date   IRON 103 04/10/2016   TIBC 396 04/10/2016   IRONPCTSAT 26 04/10/2016   Lab Results  Component Value Date   FERRITIN 25 04/10/2016     STUDIES: No results found.  ASSESSMENT: Iron deficiency anemia, easy bruising.  PLAN:    1. Iron deficiency anemia: Patient's hemoglobin and iron stores Continue to be within normal limits. Previously, the remainder of her laboratory work, including hemoglobinopathy profile, were either negative or within normal limits. She does not require additional IV iron today. Patient last received Feraheme in August 2017. Return to clinic in 6 months for repeat laboratory work and further evaluation. If her iron stores remain within normal limits at that time, she likely can be discharged from clinic. 2. Easy bruising: Patient's platelet count, PTT, and PT are all within normal limits. Platelet function assay and von Willebrand's panel are also within normal limits. Patient has no obvious underlying etiology of her bruising. Continue to monitor. 3. Hypotension: Patient is asymptomatic, monitor.  Patient expressed understanding and  was in agreement with this plan. She also understands that She can call clinic at any time with any questions, concerns, or complaints.    [name], MD   10/24/2016 10:30 PM

## 2016-10-26 ENCOUNTER — Inpatient Hospital Stay: Payer: BLUE CROSS/BLUE SHIELD | Admitting: Oncology

## 2016-10-26 ENCOUNTER — Inpatient Hospital Stay: Payer: BLUE CROSS/BLUE SHIELD

## 2016-11-08 NOTE — Progress Notes (Signed)
[facility]  [phone] Fax:(336) [phone]  ID: Vickie Turner OB: 01/24/1970  MR#: [id number]  AOZ#:[id number]#:[id number]  Patient Care Team: Mick SellFitzgerald, David P, MD as PCP - General (Infectious Diseases)  CHIEF COMPLAINT: Iron deficiency anemia, easy bruising.  INTERVAL HISTORY: Patient returns to clinic today for repeat laboratory work, further evaluation, and consideration of additional IV iron. She continues to feel well and is asymptomatic. She denies any recent fevers or illnesses. She has no neurologic complaints. She has no chest pain, shortness breath, or hemoptysis. She has a good appetite and denies weight loss. She has no nausea, vomiting, constipation, or diarrhea. She has no melena or hematochezia. She offers no specific complaints today.  REVIEW OF SYSTEMS:   Review of Systems  Constitutional: Negative for fever, malaise/fatigue and weight loss.  HENT: Negative.  Negative for congestion.   Respiratory: Negative for cough and shortness of breath.   Cardiovascular: Negative.  Negative for chest pain.  Gastrointestinal: Negative.  Negative for abdominal pain, blood in stool and melena.  Genitourinary: Negative for hematuria.  Musculoskeletal: Negative.   Neurological: Negative for weakness.  Endo/Heme/Allergies: Does not bruise/bleed easily.  Psychiatric/Behavioral: Negative.  The patient is not nervous/anxious.     As per HPI. Otherwise, a complete review of systems is negative.  PAST MEDICAL HISTORY: Past Medical History:  Diagnosis Date  . Chronic kidney disease   . Urinary (tract) obstruction     PAST SURGICAL HISTORY: Past Surgical History:  Procedure Laterality Date  . ABDOMINAL HYSTERECTOMY    . APPENDECTOMY    . BACK SURGERY    . CHOLECYSTECTOMY    . TONSILLECTOMY Bilateral     FAMILY HISTORY: Family History  Problem Relation Age of Onset  . Heart Problems Brother        ADVANCED DIRECTIVES:    HEALTH MAINTENANCE: Social  History   Tobacco Use  . Smoking status: Never Smoker  . Smokeless tobacco: Never Used  Substance Use Topics  . Alcohol use: No  . Drug use: No     Colonoscopy:  PAP:  Bone density:  Lipid panel:  Allergies  Allergen Reactions  . Sulfa Antibiotics Rash and Shortness Of Breath  . Other     Pork- religion contradiction    . Tape     Other reaction(s): Other (See Comments) PLASTIC MEDICAL TAPE, Unknown Kind of Tape    Current Outpatient Medications  Medication Sig Dispense Refill  . celecoxib (CELEBREX) 200 MG capsule     . cyanocobalamin (,VITAMIN B-12,) 1000 MCG/ML injection INJECT 1 ML WEEKLY FOR 3 WEEKS AS DIRECTED AND THEN ONCE A MONTH FOR ANEMIA    . nitrofurantoin, macrocrystal-monohydrate, (MACROBID) 100 MG capsule Take by mouth.    Marland Kitchen. omeprazole (PRILOSEC) 40 MG capsule     . diazepam (VALIUM) 2 MG tablet Take 1 tablet (2 mg total) by mouth every 6 (six) hours as needed for anxiety. 12 tablet 0  . oxyCODONE (OXY IR/ROXICODONE) 5 MG immediate release tablet Take 1 tablet (5 mg total) by mouth every 6 (six) hours as needed for severe pain. (Patient not taking: Reported on 07/24/2016) 6 tablet 0   No current facility-administered medications for this visit.     OBJECTIVE: Vitals:   11/13/16 1425  BP: 109/72  Pulse: 68  Resp: 18  Temp: (!) 97.5 F (36.4 C)     Body mass index is 27.43 kg/m.    ECOG FS:0 - Asymptomatic  General: Well-developed, well-nourished, no acute distress. Eyes: Pink  conjunctiva, anicteric sclera. Lungs: Clear to auscultation bilaterally. Heart: Regular rate and rhythm. No rubs, murmurs, or gallops. Abdomen: Soft, nontender, nondistended. No organomegaly noted, normoactive bowel sounds. Musculoskeletal: No edema, cyanosis, or clubbing. Neuro: Alert, answering all questions appropriately. Cranial nerves grossly intact. Skin: No rashes or petechiae noted. No ecchymosis noted. Psych: Normal affect.   LAB RESULTS:  Lab Results  Component  Value Date   NA 139 09/08/2016   K 3.3 (L) 09/08/2016   CL 106 09/08/2016   CO2 24 09/08/2016   GLUCOSE 118 (H) 09/08/2016   BUN 13 09/08/2016   CREATININE 0.64 09/08/2016   CALCIUM 8.9 09/08/2016   PROT 6.6 09/08/2016   ALBUMIN 3.8 09/08/2016   AST 54 (H) 09/08/2016   ALT 42 09/08/2016   ALKPHOS 172 (H) 09/08/2016   BILITOT 0.7 09/08/2016   GFRNONAA >60 09/08/2016   GFRAA >60 09/08/2016    Lab Results  Component Value Date   WBC 5.3 11/13/2016   NEUTROABS 2.3 11/13/2016   HGB 12.2 11/13/2016   HCT 35.8 11/13/2016   MCV 80.7 11/13/2016   PLT 208 11/13/2016   Lab Results  Component Value Date   IRON 96 11/13/2016   TIBC 377 11/13/2016   IRONPCTSAT 26 11/13/2016   Lab Results  Component Value Date   FERRITIN 12 11/13/2016     STUDIES: No results found.  ASSESSMENT: Iron deficiency anemia, easy bruising.  PLAN:    1. Iron deficiency anemia: Patient's hemoglobin and iron stores continue to be within normal limits. Previously, the remainder of her laboratory work, including hemoglobinopathy profile, were either negative or within normal limits. She does not require additional IV iron today. Patient last received Feraheme in August 2017.  After lengthy discussion with the patient, it was agreed upon that no further follow-up is necessary.  Please refer her back if her hemoglobin begins to drop or there are any questions or concerns.   2. Easy bruising: Patient's platelet count, PTT, and PT are all within normal limits. Platelet function assay and von Willebrand's panel are also within normal limits. Patient has no obvious underlying etiology of her bruising. Continue to monitor.  Approximately 20 minutes was spent in discussion of which greater than 50% was consultation.  Patient expressed understanding and was in agreement with this plan. She also understands that She can call clinic at any time with any questions, concerns, or complaints.    [name], MD    11/13/2016 3:20 PM

## 2016-11-09 DIAGNOSIS — Z1371 Encounter for nonprocreative screening for genetic disease carrier status: Secondary | ICD-10-CM

## 2016-11-09 HISTORY — DX: Encounter for nonprocreative screening for genetic disease carrier status: Z13.71

## 2016-11-13 ENCOUNTER — Inpatient Hospital Stay: Payer: BLUE CROSS/BLUE SHIELD | Attending: Oncology

## 2016-11-13 ENCOUNTER — Inpatient Hospital Stay: Payer: BLUE CROSS/BLUE SHIELD

## 2016-11-13 ENCOUNTER — Inpatient Hospital Stay (HOSPITAL_BASED_OUTPATIENT_CLINIC_OR_DEPARTMENT_OTHER): Payer: BLUE CROSS/BLUE SHIELD | Admitting: Oncology

## 2016-11-13 VITALS — BP 109/72 | HR 68 | Temp 97.5°F | Resp 18 | Wt 180.4 lb

## 2016-11-13 DIAGNOSIS — R233 Spontaneous ecchymoses: Secondary | ICD-10-CM

## 2016-11-13 DIAGNOSIS — Z79899 Other long term (current) drug therapy: Secondary | ICD-10-CM

## 2016-11-13 DIAGNOSIS — D509 Iron deficiency anemia, unspecified: Secondary | ICD-10-CM

## 2016-11-13 DIAGNOSIS — N189 Chronic kidney disease, unspecified: Secondary | ICD-10-CM | POA: Insufficient documentation

## 2016-11-13 LAB — IRON AND TIBC
Iron: 96 ug/dL (ref 28–170)
SATURATION RATIOS: 26 % (ref 10.4–31.8)
TIBC: 377 ug/dL (ref 250–450)
UIBC: 281 ug/dL

## 2016-11-13 LAB — CBC WITH DIFFERENTIAL/PLATELET
BASOS ABS: 0 10*3/uL (ref 0–0.1)
BASOS PCT: 1 %
EOS PCT: 2 %
Eosinophils Absolute: 0.1 10*3/uL (ref 0–0.7)
HEMATOCRIT: 35.8 % (ref 35.0–47.0)
Hemoglobin: 12.2 g/dL (ref 12.0–16.0)
Lymphocytes Relative: 48 %
Lymphs Abs: 2.6 10*3/uL (ref 1.0–3.6)
MCH: 27.6 pg (ref 26.0–34.0)
MCHC: 34.2 g/dL (ref 32.0–36.0)
MCV: 80.7 fL (ref 80.0–100.0)
MONO ABS: 0.3 10*3/uL (ref 0.2–0.9)
Monocytes Relative: 6 %
NEUTROS ABS: 2.3 10*3/uL (ref 1.4–6.5)
Neutrophils Relative %: 43 %
PLATELETS: 208 10*3/uL (ref 150–440)
RBC: 4.43 MIL/uL (ref 3.80–5.20)
RDW: 13.4 % (ref 11.5–14.5)
WBC: 5.3 10*3/uL (ref 3.6–11.0)

## 2016-11-13 LAB — FERRITIN: FERRITIN: 12 ng/mL (ref 11–307)

## 2016-11-15 ENCOUNTER — Ambulatory Visit: Payer: BLUE CROSS/BLUE SHIELD

## 2016-11-15 ENCOUNTER — Encounter: Payer: Self-pay | Admitting: Obstetrics & Gynecology

## 2016-12-04 ENCOUNTER — Encounter: Payer: Self-pay | Admitting: Obstetrics and Gynecology

## 2016-12-22 ENCOUNTER — Other Ambulatory Visit: Payer: Self-pay | Admitting: Internal Medicine

## 2016-12-25 ENCOUNTER — Other Ambulatory Visit: Payer: Self-pay

## 2016-12-26 ENCOUNTER — Other Ambulatory Visit: Payer: Self-pay

## 2016-12-26 MED ORDER — ESTROGENS CONJUGATED 0.3 MG PO TABS: 0.3000 mg | ORAL_TABLET | Freq: Every day | ORAL | 1 refills | Status: DC

## 2016-12-27 ENCOUNTER — Ambulatory Visit (INDEPENDENT_AMBULATORY_CARE_PROVIDER_SITE_OTHER): Payer: BLUE CROSS/BLUE SHIELD | Admitting: Obstetrics and Gynecology

## 2016-12-27 ENCOUNTER — Encounter: Payer: Self-pay | Admitting: Obstetrics and Gynecology

## 2016-12-27 VITALS — BP 100/70 | HR 86 | Ht 68.0 in | Wt 175.0 lb

## 2016-12-27 DIAGNOSIS — Z7183 Encounter for nonprocreative genetic counseling: Secondary | ICD-10-CM

## 2016-12-27 DIAGNOSIS — Z8041 Family history of malignant neoplasm of ovary: Secondary | ICD-10-CM | POA: Diagnosis not present

## 2016-12-27 DIAGNOSIS — Z1371 Encounter for nonprocreative screening for genetic disease carrier status: Secondary | ICD-10-CM

## 2016-12-27 NOTE — Patient Instructions (Signed)
I value your feedback and entrusting us with your care. If you get a [facility] patient survey, I would appreciate you taking the time to let us know about your experience today. Thank you! 

## 2016-12-27 NOTE — Progress Notes (Signed)
   Chief Complaint  Patient presents with  . Results    HPI:   [name] is a 46 y.o. No obstetric history on file. who LMP was No LMP recorded. Patient has had a hysterectomy., presents today for  My Risk cancer genetic testing results.  Results are negative for a cancer genetic mutation except BRCA 1 VUS.  IBIS=5.2 %  Last mammogram: pt due to sched.  Patient Active Problem List   Diagnosis Date Noted  . Family history of ovarian cancer 12/27/2016  . CIN I (cervical intraepithelial neoplasia I) 07/24/2016  . Iron deficiency anemia 08/09/2015  . Easy bruising 08/09/2015  . SOB (shortness of breath) 11/14/2013  . Palpitations 11/14/2013    Family History  Problem Relation Age of Onset  . Heart Problems Brother   . Prostate cancer Brother 27       pat 1/2 brother  . Ovarian cancer Sister 81       pat 1/2 sister  . Cancer Sister 27       pat 1/2 sister--hepatobiliary  . Kidney cancer Maternal Uncle 48     Assessment/Plan: Family history of ovarian cancer - Neg MyRisk testing. Slight increased risk of ovar cancer for pt based on FH of pat 1/2 sister.   Encounter for nonprocreative genetic counseling  BRCA negative   Recommended monthly SBE, yearly CBE, yearly mammos. Pt to sched mammo.  Patient understands these results only apply to her and her children, and this is not indicative of genetic testing results of her other family members. It is recommended that her other family members have genetic testing done.  Pt also understands negative genetic testing doesn't mean she will never get any of these cancers.   Results handout given to pt.  35  Min spent in direct contact with pt re: counseling/coordination of care.   Aron Inge B. Inara Dike, PA-C  12/27/2016 2:26 PM

## 2017-02-27 ENCOUNTER — Other Ambulatory Visit: Payer: Self-pay | Admitting: Orthopedic Surgery

## 2017-02-27 DIAGNOSIS — M25511 Pain in right shoulder: Secondary | ICD-10-CM

## 2017-03-06 ENCOUNTER — Ambulatory Visit
Admission: RE | Admit: 2017-03-06 | Discharge: 2017-03-06 | Disposition: A | Discharge status: Home / Self Care | Payer: BLUE CROSS/BLUE SHIELD | Source: Ambulatory Visit | Attending: Orthopedic Surgery | Admitting: Orthopedic Surgery

## 2017-03-06 DIAGNOSIS — M25511 Pain in right shoulder: Secondary | ICD-10-CM | POA: Diagnosis present

## 2017-03-06 DIAGNOSIS — S46911A Strain of unspecified muscle, fascia and tendon at shoulder and upper arm level, right arm, initial encounter: Secondary | ICD-10-CM | POA: Diagnosis not present

## 2017-03-06 DIAGNOSIS — X58XXXA Exposure to other specified factors, initial encounter: Secondary | ICD-10-CM | POA: Insufficient documentation

## 2017-03-06 DIAGNOSIS — M25411 Effusion, right shoulder: Secondary | ICD-10-CM | POA: Insufficient documentation

## 2017-03-16 ENCOUNTER — Other Ambulatory Visit: Payer: Self-pay | Admitting: Internal Medicine

## 2017-04-27 ENCOUNTER — Other Ambulatory Visit: Payer: Self-pay | Admitting: Internal Medicine

## 2017-05-07 IMAGING — CT CT RENAL STONE PROTOCOL
2 of 4 series · 15 of 46 positions shown, 17 images · non-contrast
Comparison: 05/15/2015

CLINICAL DATA: Sent to ED from [REDACTED]. C/O low back pain R > L., C/O
swelling to lower extremities since [REDACTED] L > R, and back pain
worsening over the past two hours.

EXAM:
CT ABDOMEN AND PELVIS WITHOUT CONTRAST
TECHNIQUE: Multidetector CT imaging of the abdomen and pelvis was performed
following the standard protocol without IV contrast.

[Series 2: axial st · axial · 0.76mm/px · z∈[-880,-455]mm · 12 of 101 slices shown, 14 images]
[im 8/101  soft-tissue]
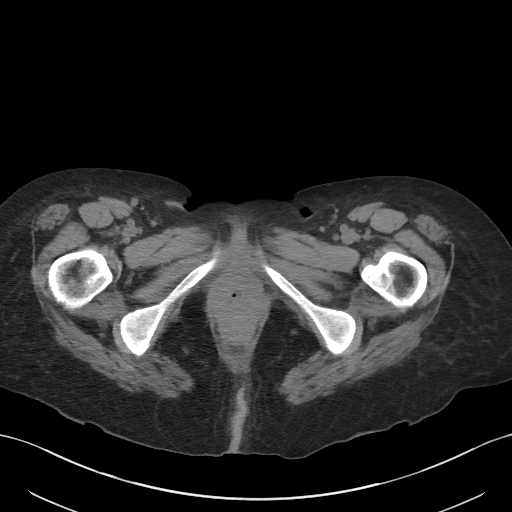
[im 8/101  bone]
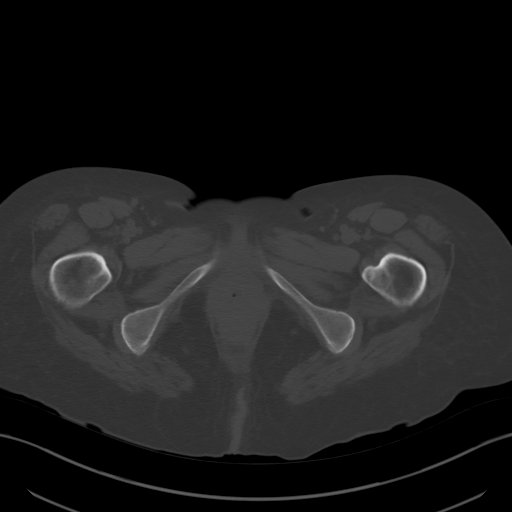
[im 16/101  soft-tissue]
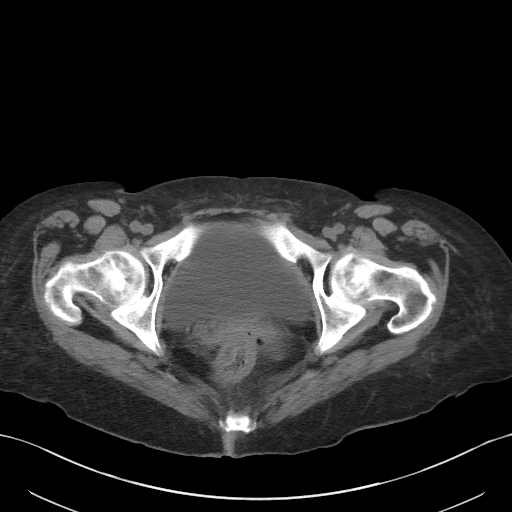
[im 24/101  soft-tissue]
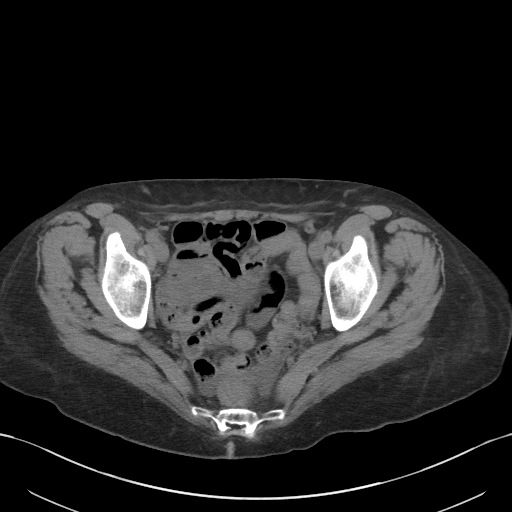
[im 31/101  soft-tissue]
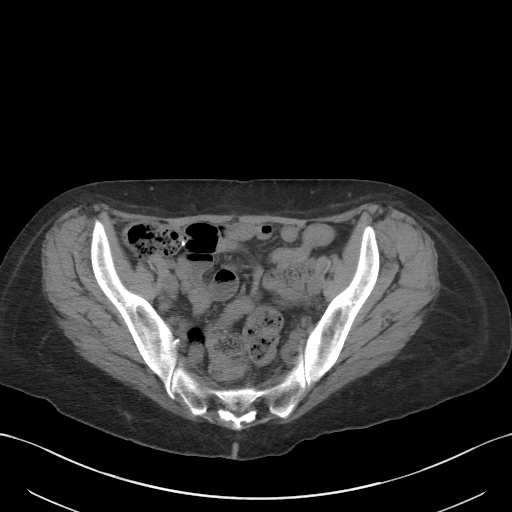
[im 39/101  soft-tissue]
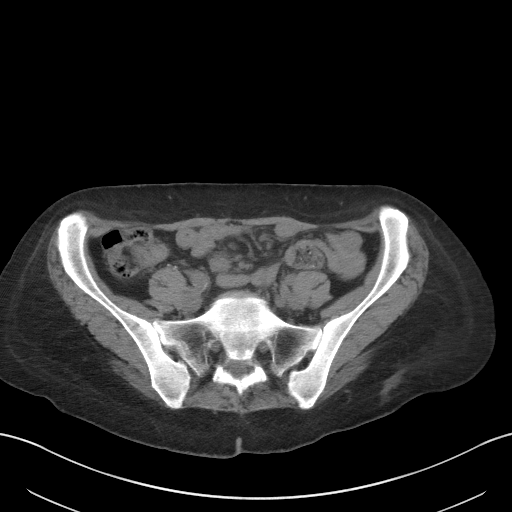
[im 47/101  soft-tissue]
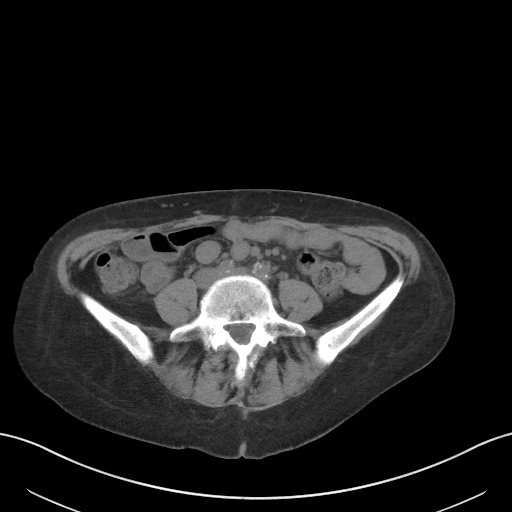
[im 54/101  soft-tissue]
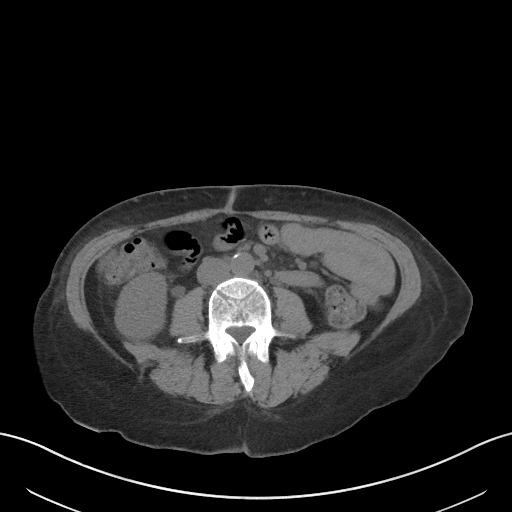
[im 62/101  soft-tissue]
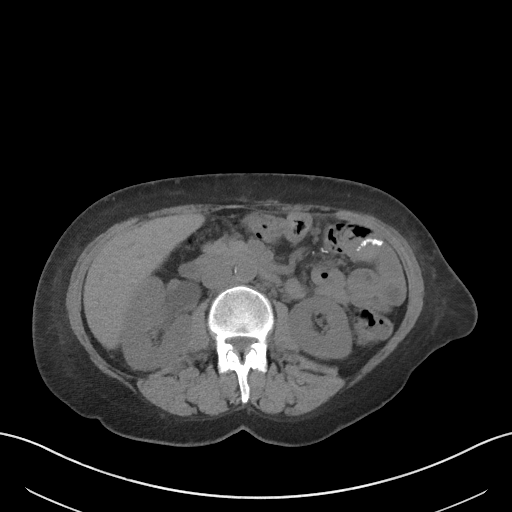
[im 70/101  soft-tissue]
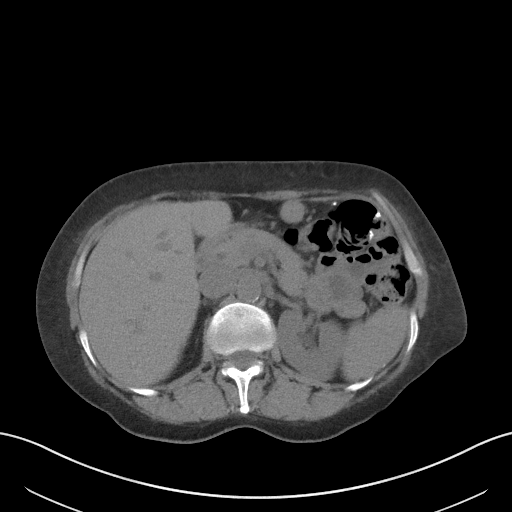
[im 70/101  bone]
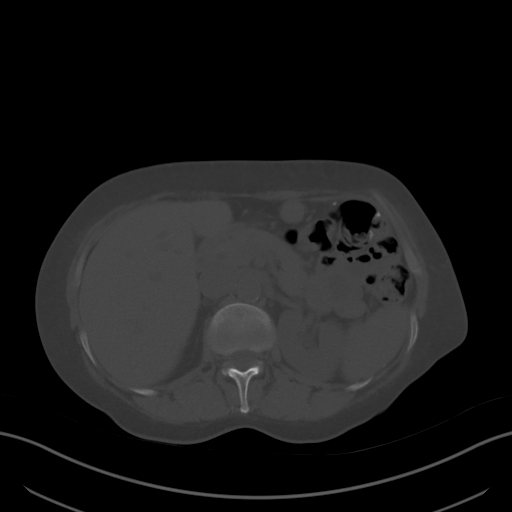
[im 77/101  soft-tissue]
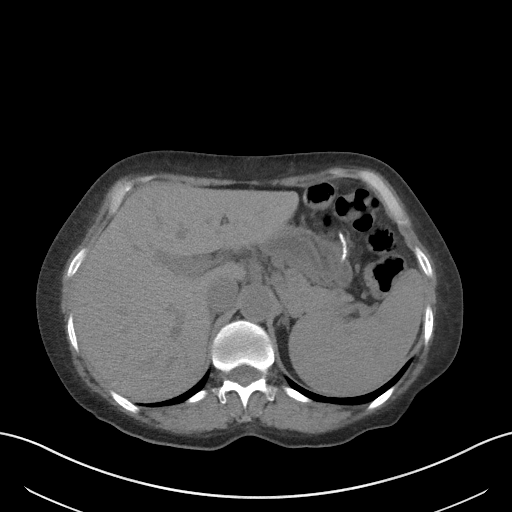
[im 85/101  soft-tissue]
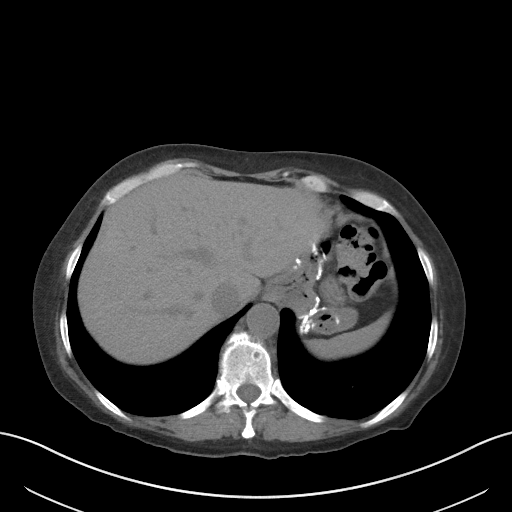
[im 93/101  soft-tissue]
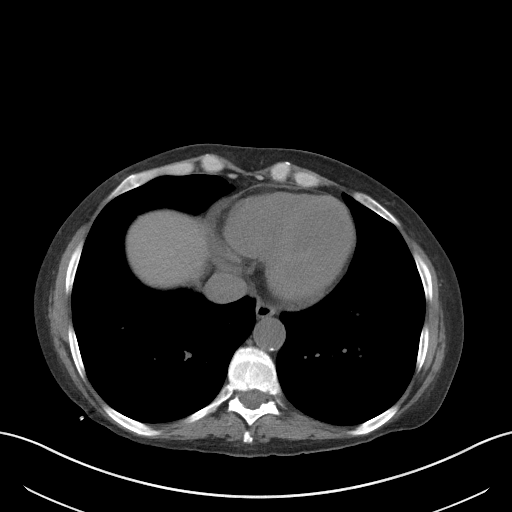

[Series 4: coronal st · coronal · 0.70mm/px · 3 of 78 slices shown]
[im 26/78  soft-tissue]
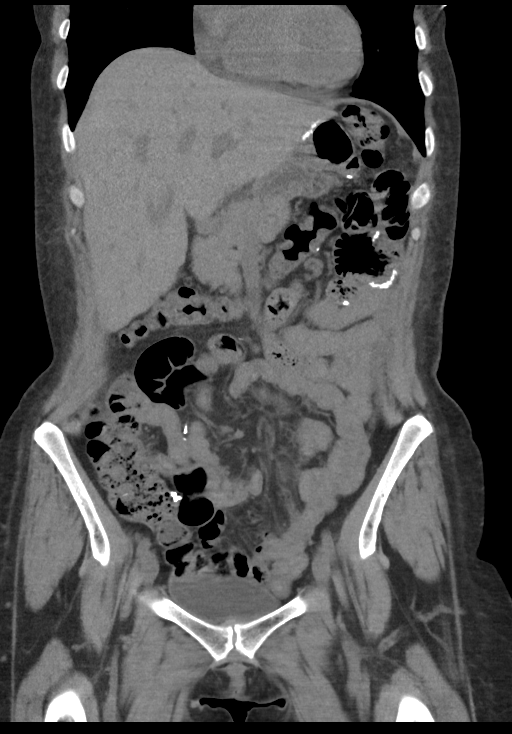
[im 35/78  soft-tissue]
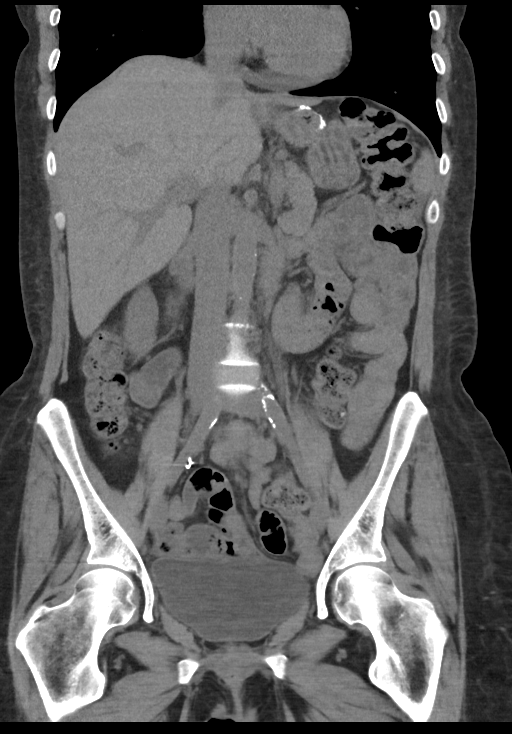
[im 43/78  soft-tissue]
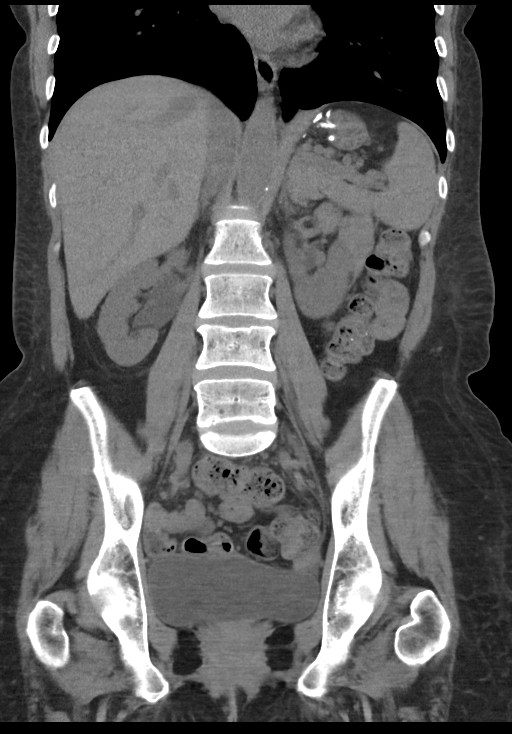

[15 of 46 positions shown; findings below may reference images not displayed]

FINDINGS: Lung bases: Clear.

Hepatobiliary: Normal liver. Gallbladder surgically absent. No bile
duct dilation.

Spleen, pancreas, adrenal glands:  Normal.

Kidneys, ureters, bladder: Moderate right and mild left renal
collecting system dilation. 15 mm homogeneous exophytic mildly hyper
attenuating mass from the lower pole of the left kidney likely a
cyst complicated by previous hemorrhage. This is stable. No other
renal masses. No stones Send ureteral stones. Bladder is
unremarkable.

Uterus and adnexa: Uterus surgically absent.  No adnexal masses.

The lymph nodes:  No pathologically enlarged lymph nodes.

Ascites:  Trace pelvic free fluid.

Vascular: Scattered aortoiliac vascular calcifications. No aneurysm.

Gastrointestinal: Status post gastric surgery with formation of a
gastrojejunostomy. Surgical clips adjacent cecum or consistent with
prior appendectomy. No evidence of bowel obstruction. No bowel wall
thickening or adjacent inflammation.

Musculoskeletal: Minor degenerative changes of the lower thoracic
and lumbar spine. No osteoblastic or osteolytic lesions.
IMPRESSION: 1. No acute findings within the abdomen pelvis.
2. Moderate right and mild left renal collecting system dilation.
This is unchanged from the prior CT. It may be physiologic. Low
grade ureteropelvic junction obstruction could have this appearance.
No evidence of a ureteral stone. No intrarenal stones.
3. Status post cholecystectomy and gastric bypass surgery.

## 2017-06-13 ENCOUNTER — Other Ambulatory Visit: Payer: Self-pay | Admitting: Internal Medicine

## 2017-06-14 ENCOUNTER — Other Ambulatory Visit: Payer: Self-pay

## 2017-07-24 ENCOUNTER — Other Ambulatory Visit: Payer: Self-pay | Admitting: Internal Medicine

## 2017-07-25 NOTE — Telephone Encounter (Signed)
Can you please send this?

## 2017-09-05 ENCOUNTER — Ambulatory Visit (INDEPENDENT_AMBULATORY_CARE_PROVIDER_SITE_OTHER): Payer: BLUE CROSS/BLUE SHIELD | Admitting: Obstetrics & Gynecology

## 2017-09-05 ENCOUNTER — Encounter: Payer: Self-pay | Admitting: Obstetrics & Gynecology

## 2017-09-05 ENCOUNTER — Other Ambulatory Visit (HOSPITAL_COMMUNITY)
Admission: RE | Admit: 2017-09-05 | Discharge: 2017-09-05 | Disposition: A | Discharge status: Home / Self Care | Payer: BLUE CROSS/BLUE SHIELD | Source: Ambulatory Visit | Attending: Obstetrics & Gynecology | Admitting: Obstetrics & Gynecology

## 2017-09-05 VITALS — BP 110/70 | Ht 68.0 in | Wt 184.0 lb

## 2017-09-05 DIAGNOSIS — Z0000 Encounter for general adult medical examination without abnormal findings: Secondary | ICD-10-CM

## 2017-09-05 DIAGNOSIS — Z1231 Encounter for screening mammogram for malignant neoplasm of breast: Secondary | ICD-10-CM | POA: Diagnosis not present

## 2017-09-05 DIAGNOSIS — N870 Mild cervical dysplasia: Secondary | ICD-10-CM

## 2017-09-05 DIAGNOSIS — Z1239 Encounter for other screening for malignant neoplasm of breast: Secondary | ICD-10-CM

## 2017-09-05 DIAGNOSIS — Z01411 Encounter for gynecological examination (general) (routine) with abnormal findings: Secondary | ICD-10-CM | POA: Diagnosis not present

## 2017-09-05 MED ORDER — ESTROGENS CONJUGATED 0.3 MG PO TABS: 0.3000 mg | ORAL_TABLET | Freq: Every day | ORAL | 3 refills | Status: DC

## 2017-09-05 NOTE — Progress Notes (Signed)
HPI:      [name] is a 47 y.o. No LMP recorded. Patient has had a hysterectomy., she presents today for her annual examination. The patient has no complaints today. The patient is sexually active. Her last pap: approximate date 2018 and was abnormal: ASCUS; followed by COlpo/BX w CIN I and last mammogram: approximate date 2012 and was normal . The patient does perform self breast exams.  There is notable family history of breast or ovarian cancer in her family.  Sister w Ovarian Cancer. Pt is BRCA NEG 2018.  The patient has regular exercise: yes.  The patient denies current symptoms of depression.  Prior [place] BSO, has [place] Ovarian Cancer.  Takes ERT Monthly for sx's.  GYN History: Contraception: status post hysterectomy  PMHx: Past Medical History:  Diagnosis Date  . BRCA negative 11/2016   MyRisk neg, except BRCA 1 VUS; IBIS=5.2%  . Chronic kidney disease   . Family history of ovarian cancer   . Urinary (tract) obstruction    Past Surgical History:  Procedure Laterality Date  . ABDOMINAL HYSTERECTOMY    . APPENDECTOMY    . BACK SURGERY    . CHOLECYSTECTOMY    . PERIPHERAL VASCULAR CATHETERIZATION  10/02/2014   Procedure: PICC Line Insertion;  Surgeon: Algernon Huxley, MD;  Location: [facility];  Service: Cardiovascular;;  . TONSILLECTOMY Bilateral    Family History  Problem Relation Age of Onset  . Heart Problems Brother   . Prostate cancer Brother 106       pat 1/2 brother  . Ovarian cancer Sister 37       pat 1/2 sister  . Cancer Sister 46       pat 1/2 sister--hepatobiliary  . Kidney cancer Maternal Uncle 46   Social History   Tobacco Use  . Smoking status: Never Smoker  . Smokeless tobacco: Never Used  Substance Use Topics  . Alcohol use: No  . Drug use: No    Current Outpatient Medications:  .  celecoxib (CELEBREX) 200 MG capsule, TAKE 1 CAPSULE BY MOUTH DAILY FOR ARTHRITIS, Disp: 30 capsule, Rfl: 3 .  cyanocobalamin (,VITAMIN B-12,) 1000 MCG/ML  injection, INJECT 1 ML WEEKLY FOR 3 WEEKS AS DIRECTED AND THEN ONCE A MONTH FOR ANEMIA, Disp: , Rfl:  .  cyclobenzaprine (FLEXERIL) 10 MG tablet, TAKE 1 TABLET TWICE  A DAY, Disp: 60 tablet, Rfl: 3 .  diazepam (VALIUM) 2 MG tablet, Take 1 tablet (2 mg total) by mouth every 6 (six) hours as needed for anxiety., Disp: 12 tablet, Rfl: 0 .  estrogens, conjugated, (PREMARIN) 0.3 MG tablet, Take 1 tablet (0.3 mg total) by mouth daily. Take daily for 21 days then do not take for 7 days., Disp: 90 tablet, Rfl: 1 .  nitrofurantoin, macrocrystal-monohydrate, (MACROBID) 100 MG capsule, Take by mouth., Disp: , Rfl:  .  omeprazole (PRILOSEC) 40 MG capsule, TAKE 1 CAPSULE EVERY DAY, Disp: 60 capsule, Rfl: 3 .  oxyCODONE (OXY IR/ROXICODONE) 5 MG immediate release tablet, Take 1 tablet (5 mg total) by mouth every 6 (six) hours as needed for severe pain., Disp: 6 tablet, Rfl: 0 .  PREMARIN 0.3 MG tablet, TAKE ONE TABLET EVERY DAY, Disp: 90 tablet, Rfl: 1 .  traMADol (ULTRAM) 50 MG tablet, TAKE 1 TABLET BY MOUTH TWICE A DAY AS NEEDED FOR, Disp: 60 tablet, Rfl: 3 Allergies: Sulfa antibiotics; Other; and Tape  Review of Systems  Constitutional: Negative for chills, fever and malaise/fatigue.  HENT: Negative for congestion, sinus pain and sore throat.   Eyes: Negative for blurred vision and pain.  Respiratory: Negative for cough and wheezing.   Cardiovascular: Negative for chest pain and leg swelling.  Gastrointestinal: Negative for abdominal pain, constipation, diarrhea, heartburn, nausea and vomiting.  Genitourinary: Negative for dysuria, frequency, hematuria and urgency.  Musculoskeletal: Negative for back pain, joint pain, myalgias and neck pain.  Skin: Negative for itching and rash.  Neurological: Negative for dizziness, tremors and weakness.  Endo/Heme/Allergies: Does not bruise/bleed easily.  Psychiatric/Behavioral: Negative for depression. The patient is not nervous/anxious and does not have insomnia.      Objective: BP 110/70   Ht '5\' 8"'  (1.727 m)   Wt 184 lb (83.5 kg)   BMI 27.98 kg/m   Filed Weights   09/05/17 1458  Weight: 184 lb (83.5 kg)   Body mass index is 27.98 kg/m. Physical Exam  Constitutional: She is oriented to person, place, and time. She appears well-developed and well-nourished. No distress.  Genitourinary: Rectum normal and vagina normal. Pelvic exam was performed with patient supine. There is no rash or lesion on the right labia. There is no rash or lesion on the left labia. Vagina exhibits no lesion. No bleeding in the vagina. Right adnexum does not display mass and does not display tenderness. Left adnexum does not display mass and does not display tenderness. Cervix does not exhibit motion tenderness, discharge or polyp.  Genitourinary Comments: Absent Uterus  HENT:  Head: Normocephalic and atraumatic. Head is without laceration.  Right Ear: Hearing normal.  Left Ear: Hearing normal.  Nose: No epistaxis.  No foreign bodies.  Mouth/Throat: Uvula is midline, oropharynx is clear and moist and mucous membranes are normal.  Eyes: Pupils are equal, round, and reactive to light.  Neck: Normal range of motion. Neck supple. No thyromegaly present.  Cardiovascular: Normal rate and regular rhythm. Exam reveals no gallop and no friction rub.  No murmur heard. Pulmonary/Chest: Effort normal and breath sounds normal. No respiratory distress. She has no wheezes. Right breast exhibits no mass, no skin change and no tenderness. Left breast exhibits no mass, no skin change and no tenderness.  Abdominal: Soft. Bowel sounds are normal. She exhibits no distension. There is no tenderness. There is no rebound.  Musculoskeletal: Normal range of motion.  Neurological: She is alert and oriented to person, place, and time. No cranial nerve deficit.  Skin: Skin is warm and dry.  Psychiatric: She has a normal mood and affect. Judgment normal.  Vitals reviewed.   Assessment:  ANNUAL  EXAM 1. Annual physical exam   2. CIN I (cervical intraepithelial neoplasia I)   3. Screening for breast cancer    Screening Plan:            1.  Cont ERT per pt request, helps w insomnia and hot flashes  2. Breast screening- Exam annually and mammogram>40 planned  Encouraged MMG, as she has not followed thru in past BRCA neg, T-C  5%  3. Colonoscopy every 10 years, Hemoccult testing - after age 34  4. Labs managed by PCP   5. CIN I (cervical intraepithelial neoplasia I) PAP LEEP vs trachelectomy if HGSIL     F/U  Return in about 1 year (around 09/06/2018) for Annual.  Barnett Applebaum, MD, [facility], [facility] 09/05/2017  3:11 PM

## 2017-09-05 NOTE — Patient Instructions (Signed)
PAP every year Mammogram every year    Call [phone] to schedule at [facility] yearly (with PCP)   

## 2017-09-06 ENCOUNTER — Other Ambulatory Visit: Payer: Self-pay | Admitting: Internal Medicine

## 2017-09-12 LAB — CYTOLOGY - PAP
Diagnosis: NEGATIVE
HPV: NOT DETECTED

## 2017-09-27 ENCOUNTER — Other Ambulatory Visit: Payer: Self-pay | Admitting: Internal Medicine

## 2017-09-28 NOTE — Telephone Encounter (Signed)
Can you please send if this is ok ?

## 2017-12-04 ENCOUNTER — Telehealth: Payer: Self-pay

## 2017-12-04 NOTE — Telephone Encounter (Signed)
-----   Message from Nadara Mustardobert P Harris, MD sent at 12/04/2017 10:28 AM EST ----- Regarding: MMG Received notice she has not received MMG yet as ordered at her Annual. Please check and encourage her to do this, and document conversation.

## 2017-12-04 NOTE — Telephone Encounter (Signed)
Pt states she will call and schedule her mammo now

## 2017-12-10 ENCOUNTER — Other Ambulatory Visit: Payer: Self-pay | Admitting: Internal Medicine

## 2017-12-11 ENCOUNTER — Telehealth: Payer: Self-pay

## 2017-12-11 NOTE — Telephone Encounter (Signed)
Advised pt to schedule mammo

## 2018-06-04 IMAGING — CR DG ELBOW COMPLETE 3+V*L*
1 series · 4 of 4 positions shown · non-contrast
Comparison: None.

CLINICAL DATA: Left elbow pain after falling on concrete today.

EXAM:
LEFT ELBOW - COMPLETE 3+ VIEW

[Series 1: dg elbow complete left (3+view) · 0.14mm/px · 4 of 4 slices shown]
[im 1/4]
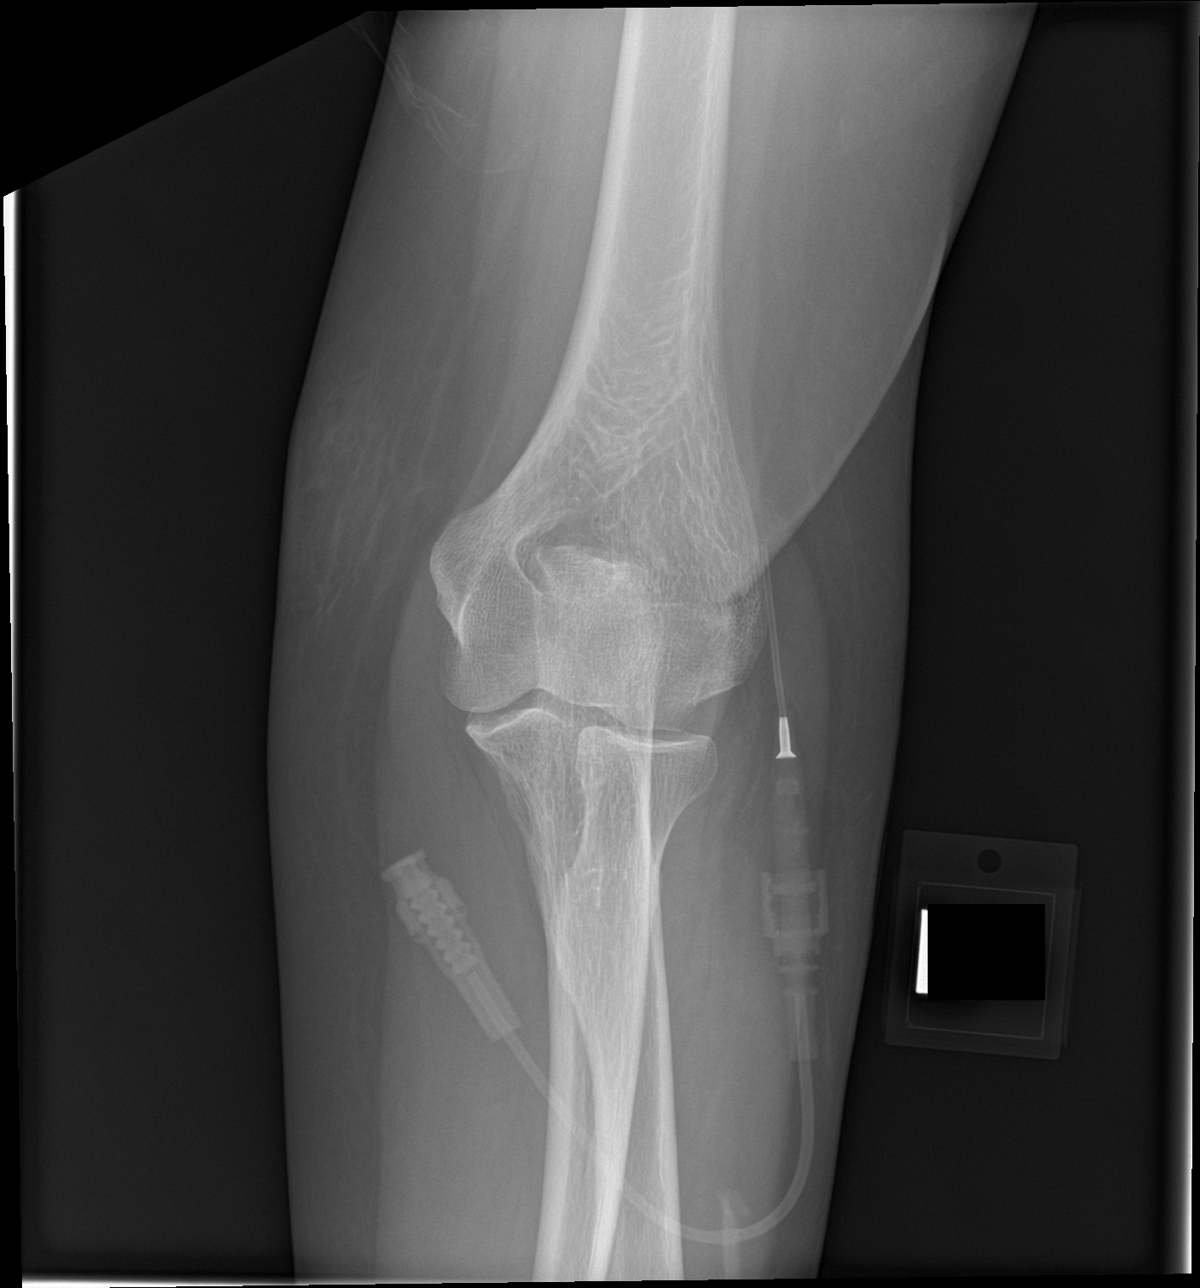
[im 2/4]
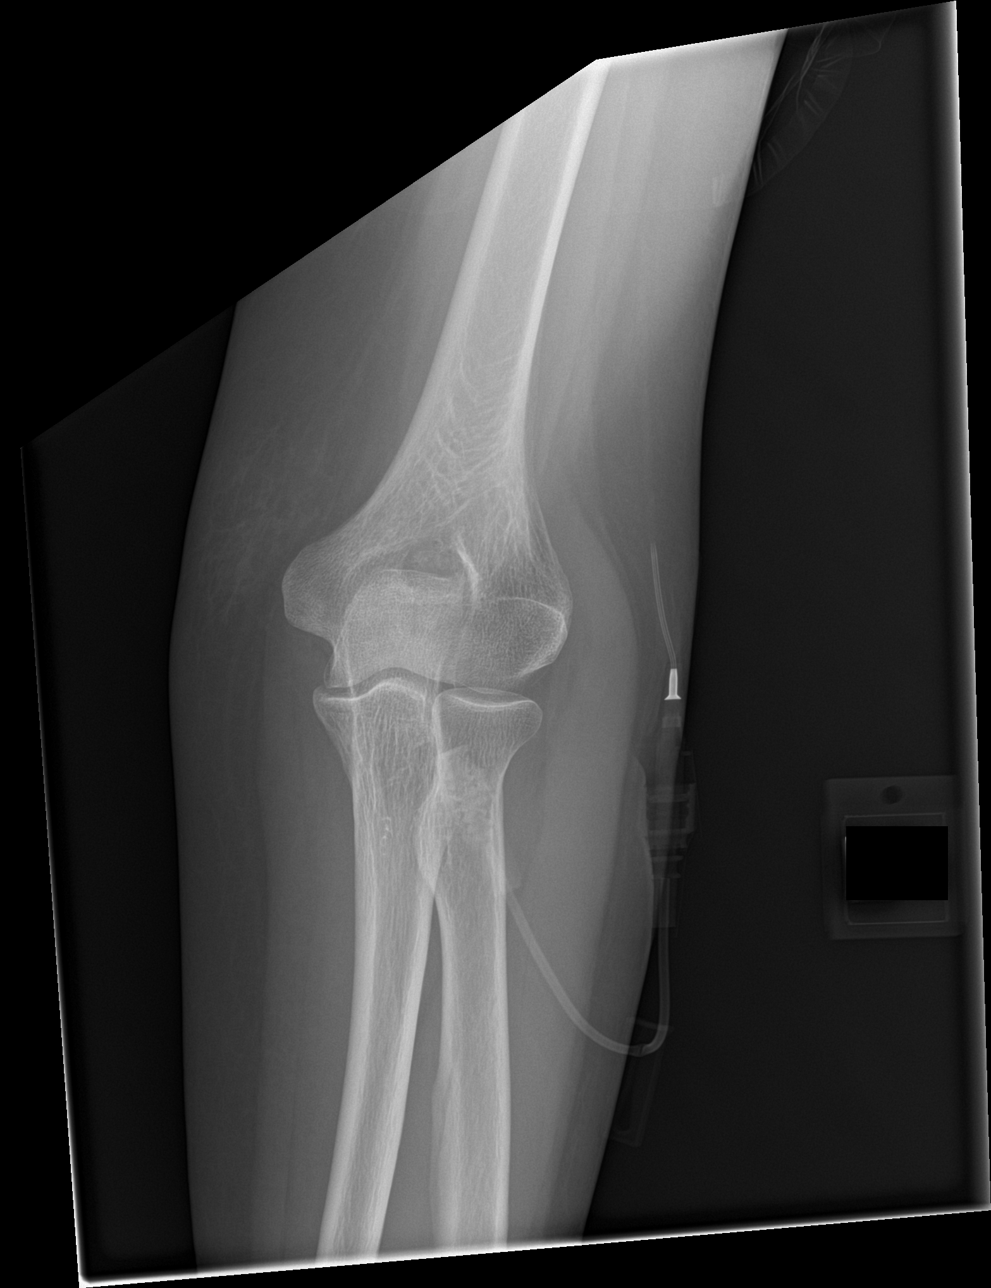
[im 3/4]
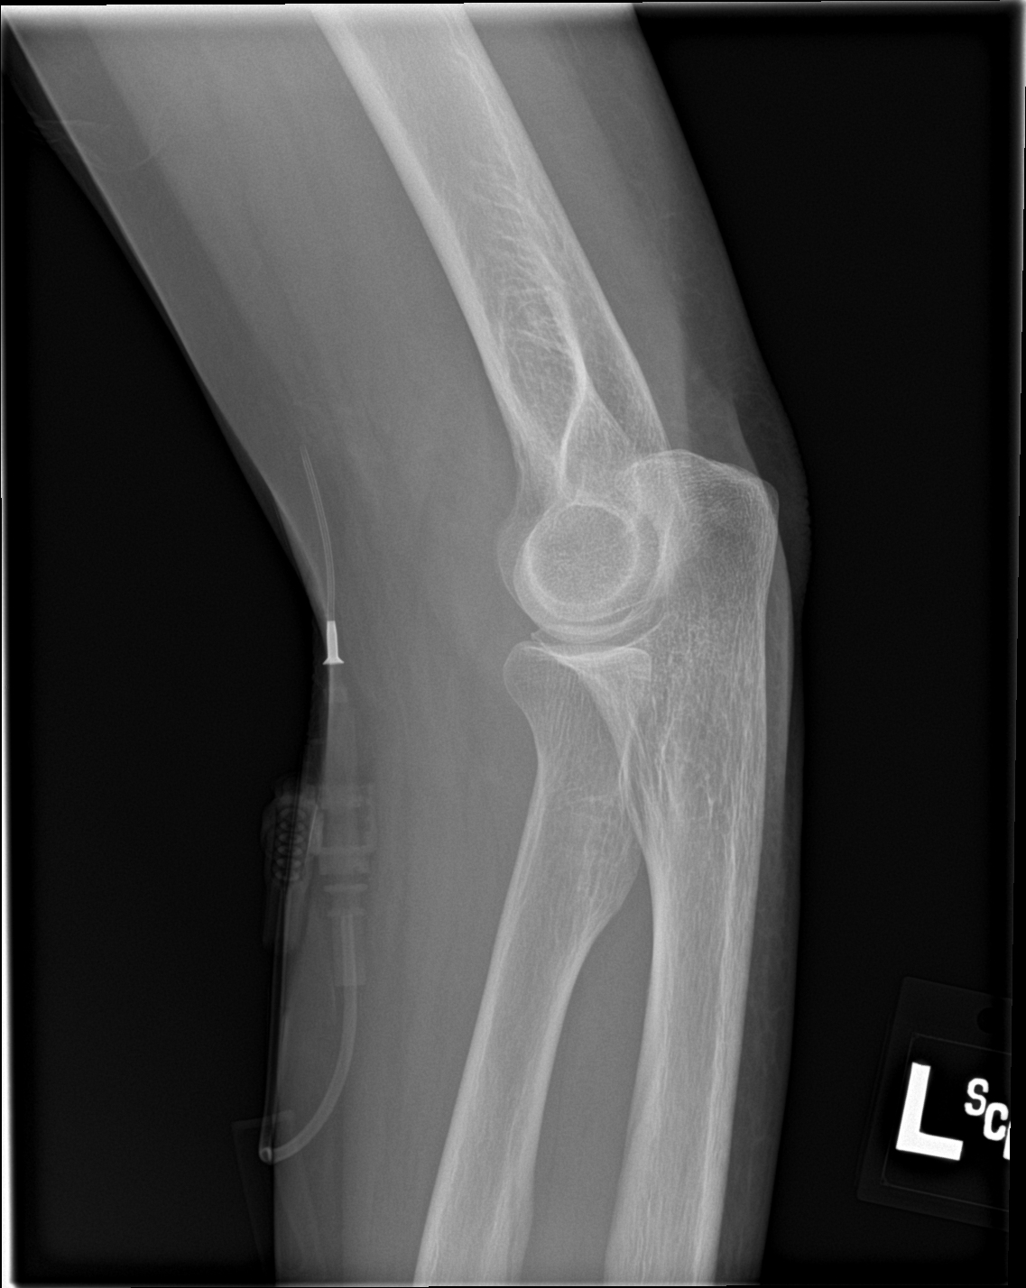
[im 4/4]
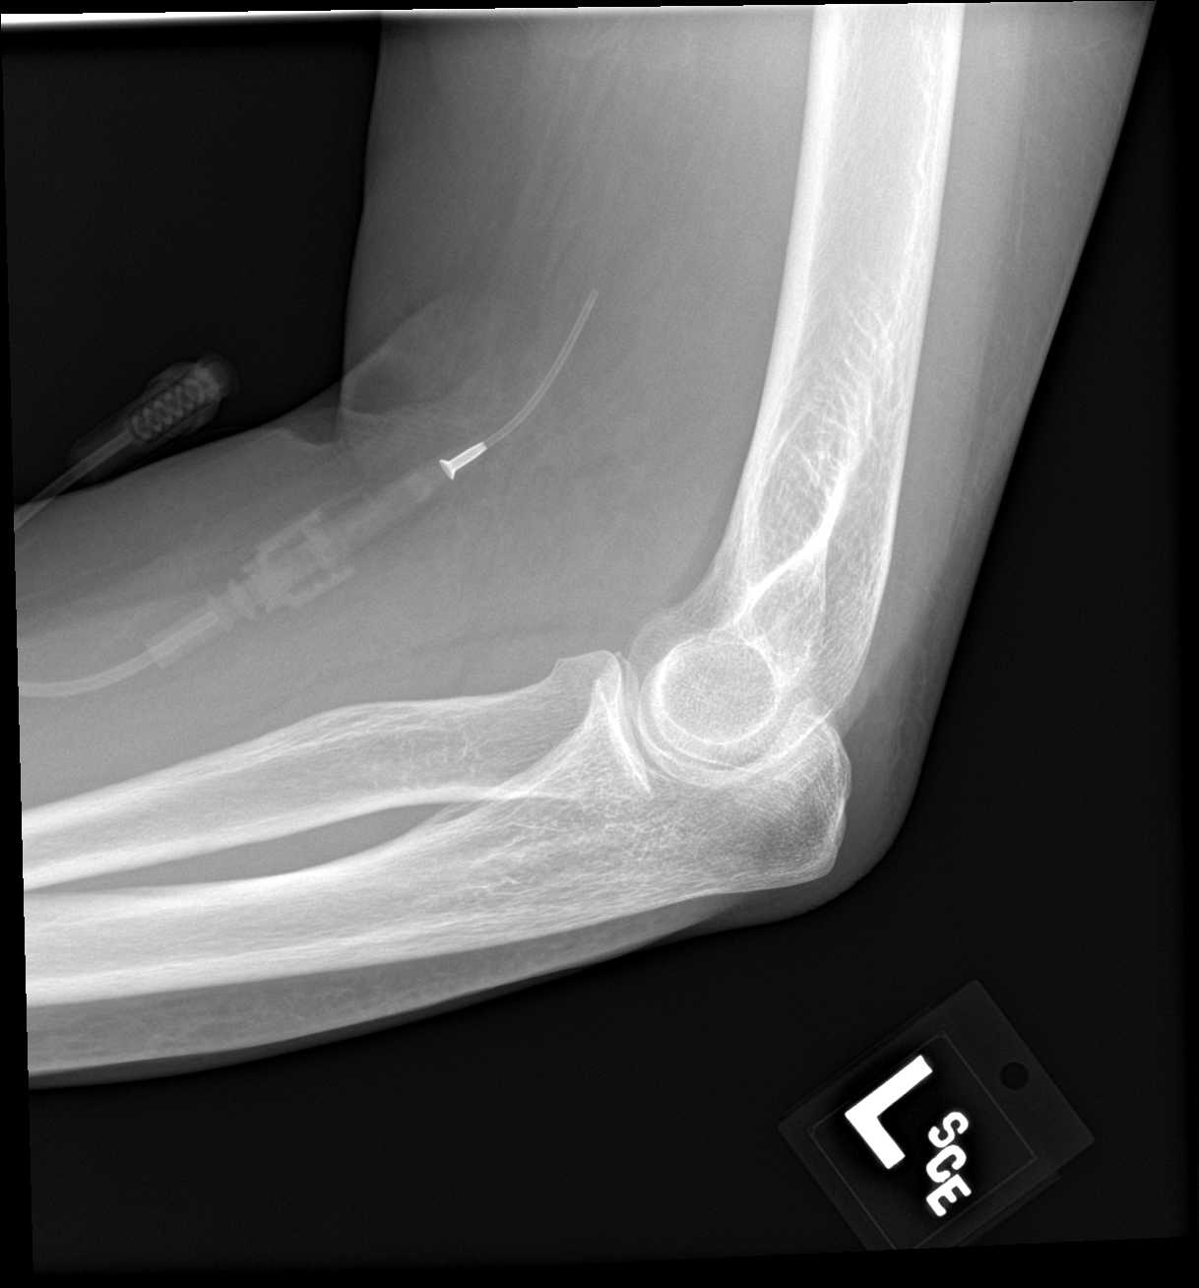

[4 of 4 positions shown; findings below may reference images not displayed]

FINDINGS: There is no evidence of fracture, dislocation, or joint effusion.
There is no evidence of arthropathy or other focal bone abnormality.
Soft tissues are unremarkable.
IMPRESSION: Normal examination.

## 2018-06-04 IMAGING — CR DG KNEE COMPLETE 4+V*R*
1 series · 4 of 4 positions shown · non-contrast
Comparison: Right knee MR dated 05/10/2013.

CLINICAL DATA: Right knee pain after falling on concrete today.

EXAM:
RIGHT KNEE - COMPLETE 4+ VIEW

[Series 1: dg knee complete 4 views right · 0.14mm/px · 4 of 4 slices shown]
[im 1/4]
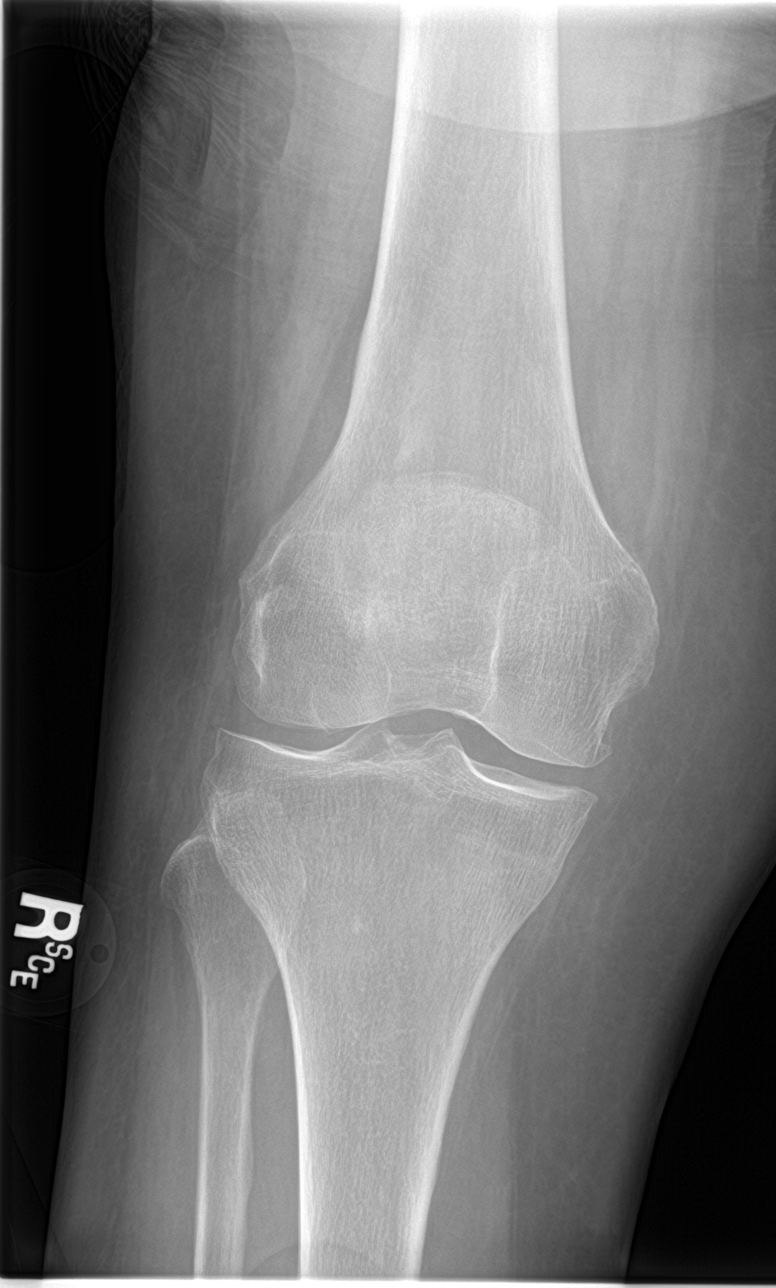
[im 2/4]
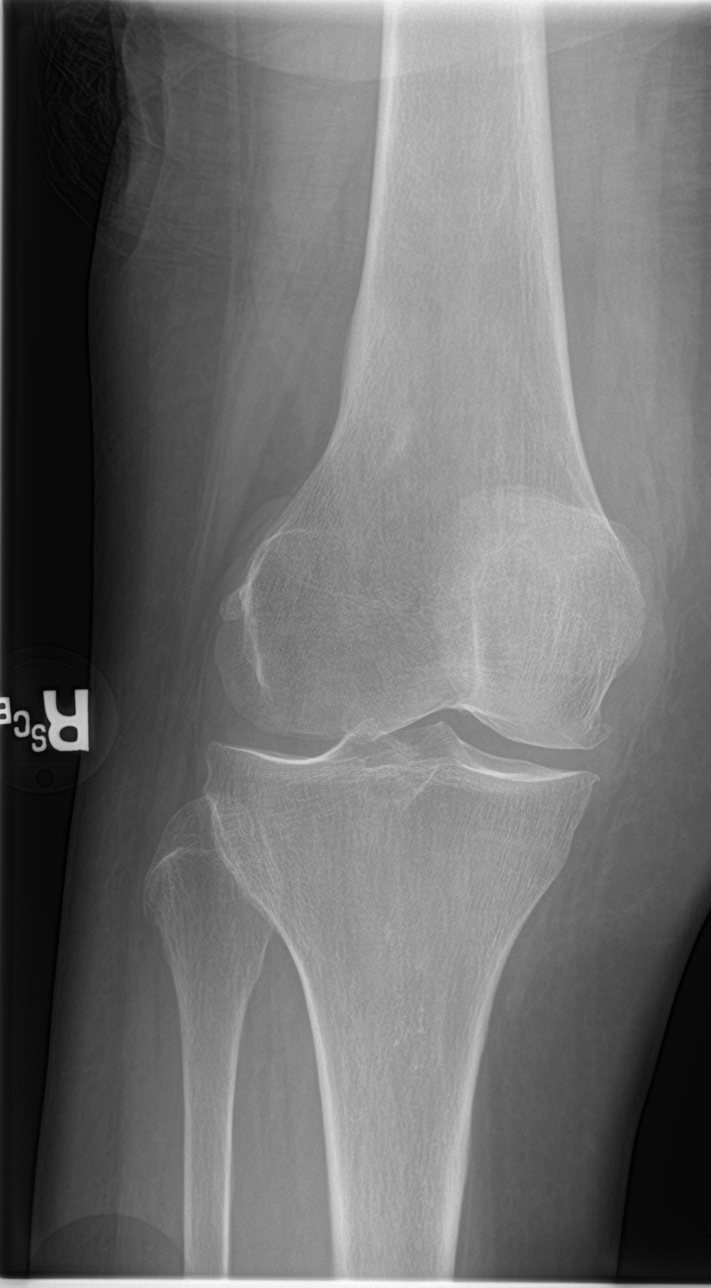
[im 3/4]
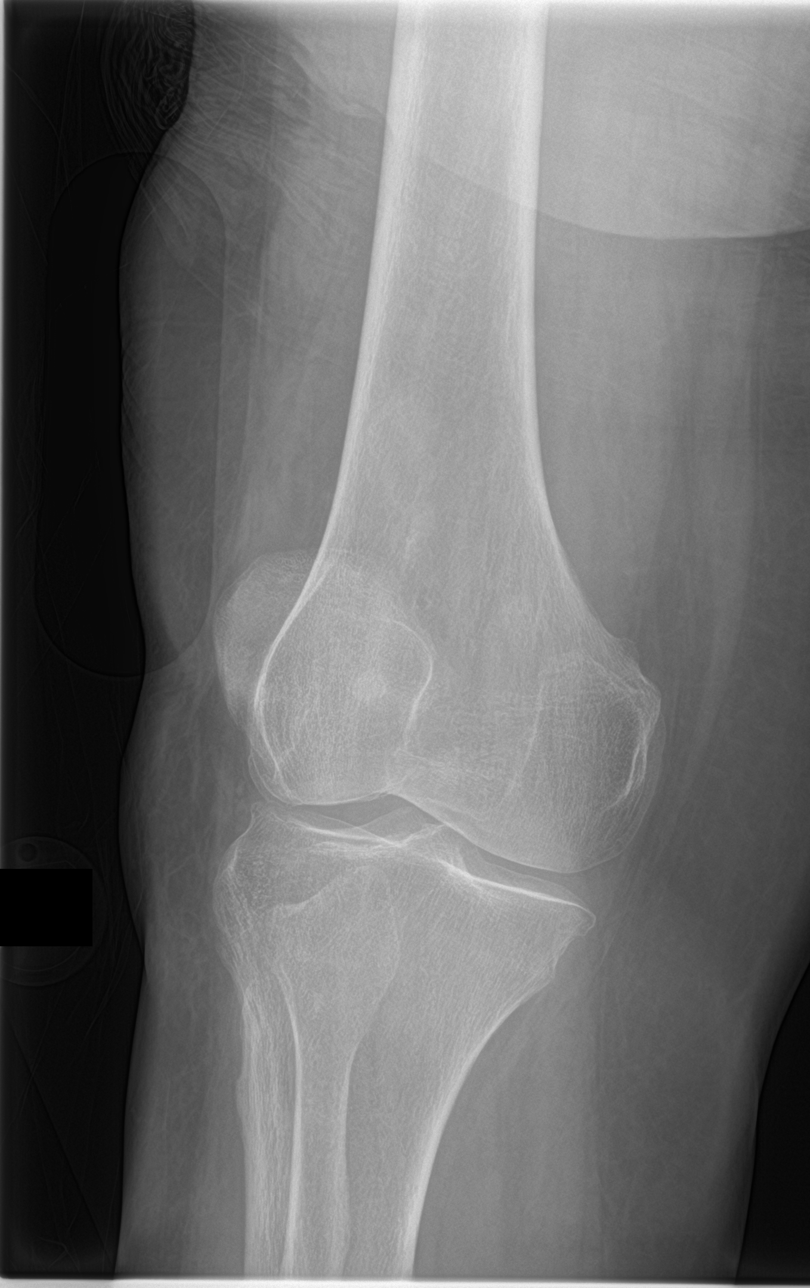
[im 4/4]
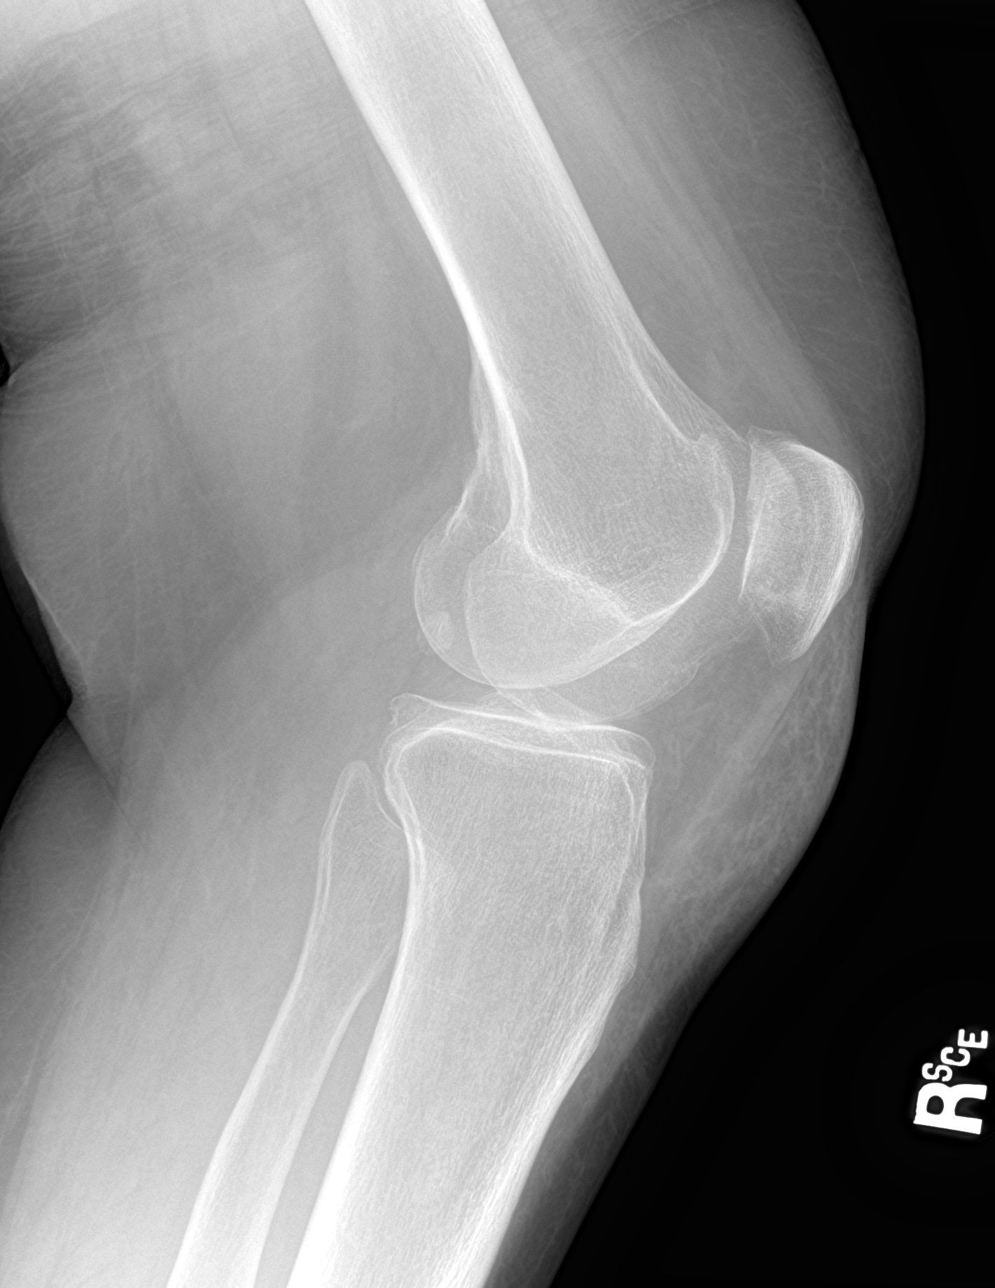

[4 of 4 positions shown; findings below may reference images not displayed]

FINDINGS: Anterior soft tissue swelling. Mild moderate medial spur formation,
mild patellofemoral spur formation and minimal lateral spur
formation. Previously demonstrated small defect in the posterior
patella. No fracture, dislocation or effusion.
IMPRESSION: 1. No fracture or dislocation.
2. Mild tricompartmental degenerative changes and previously
demonstrated grade 4 chondromalacia patellae.

## 2018-06-04 IMAGING — CR DG SHOULDER 2+V*R*
1 series · 4 of 4 positions shown · non-contrast
Comparison: Right shoulder MR dated 10/28/2015.

CLINICAL DATA: Right shoulder pain after falling on concrete today.

EXAM:
RIGHT SHOULDER - 2+ VIEW

[Series 1: dg shoulder right · 0.14mm/px · 4 of 4 slices shown]
[im 1/4]
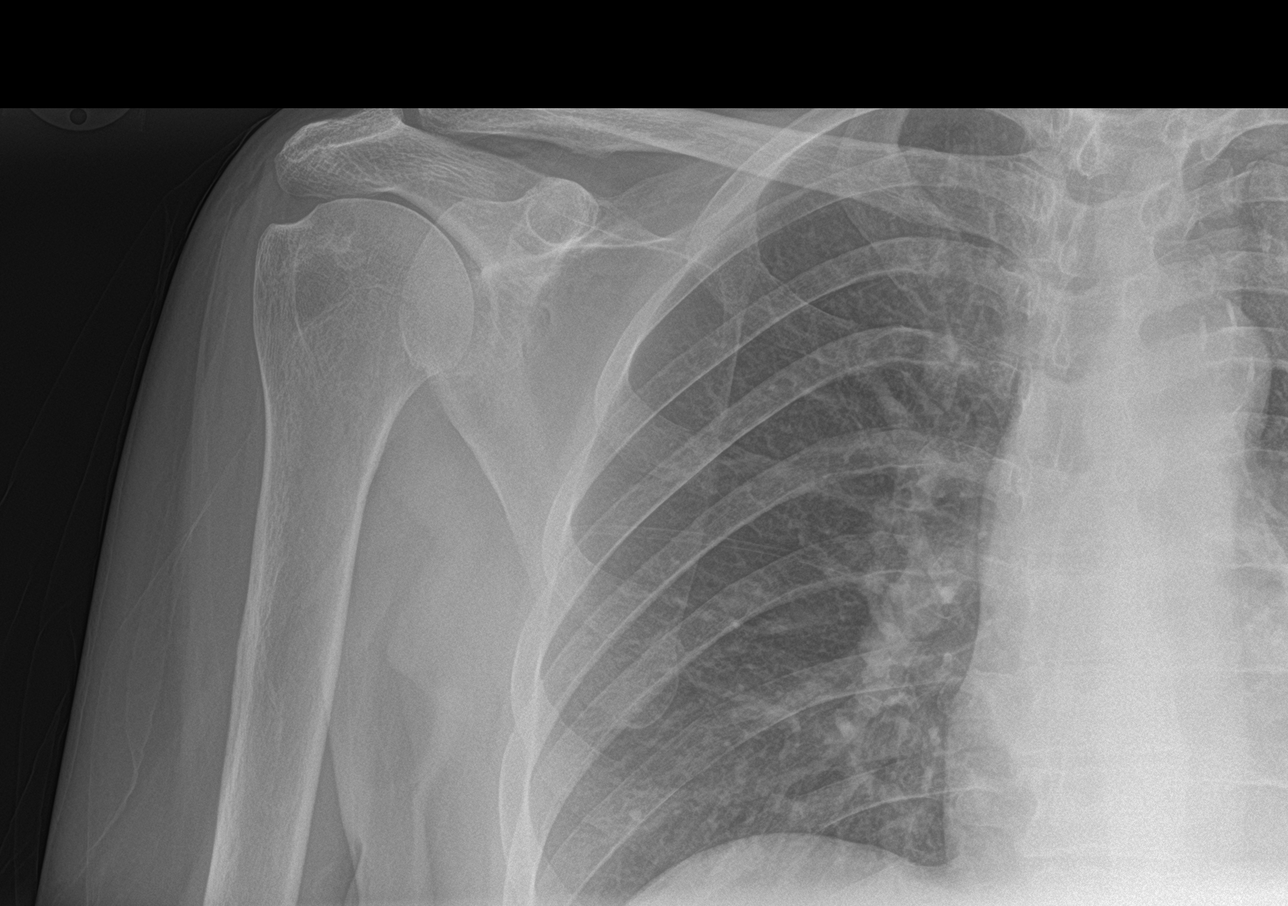
[im 2/4]
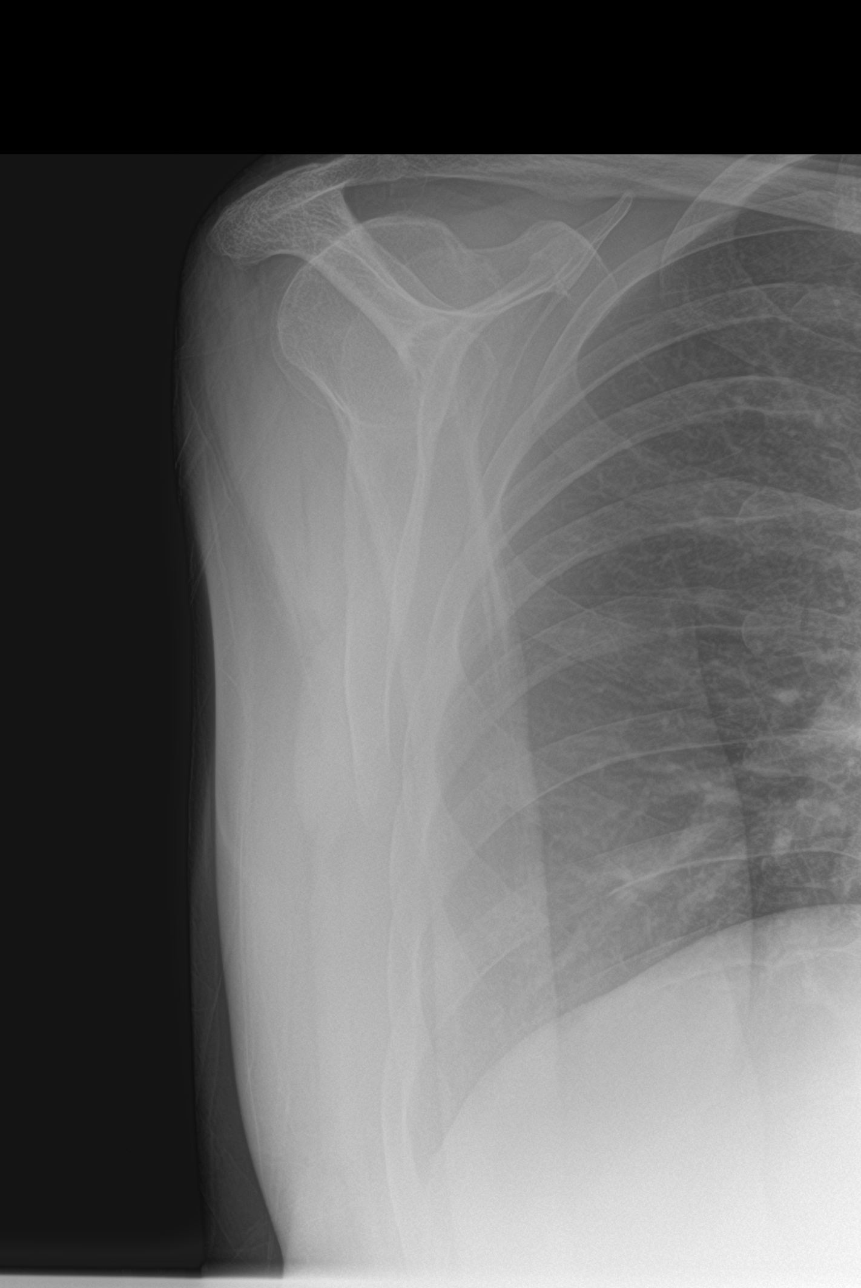
[im 3/4]
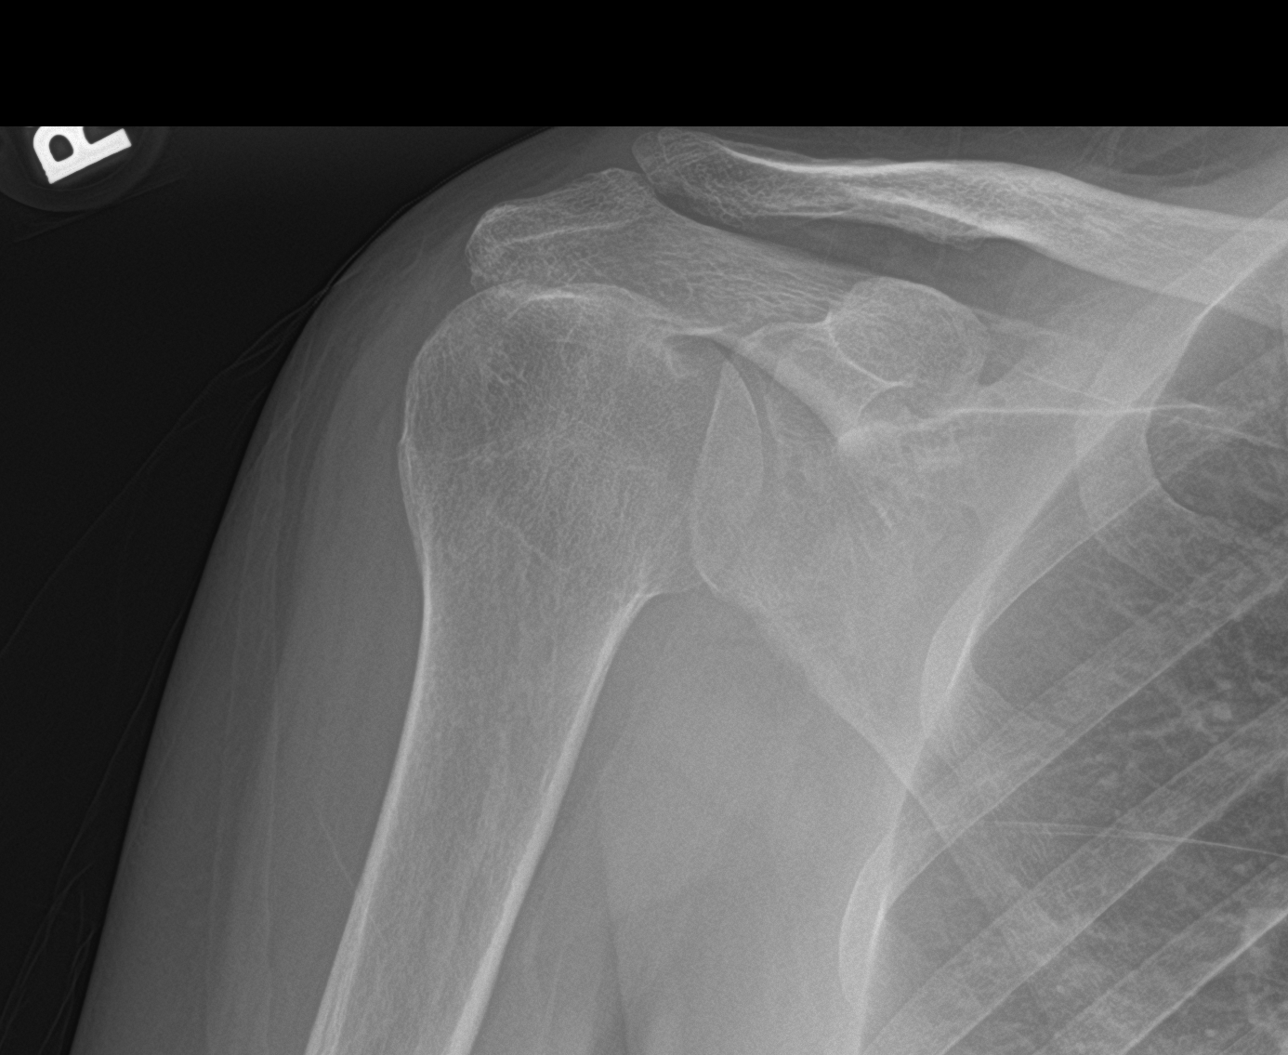
[im 4/4]
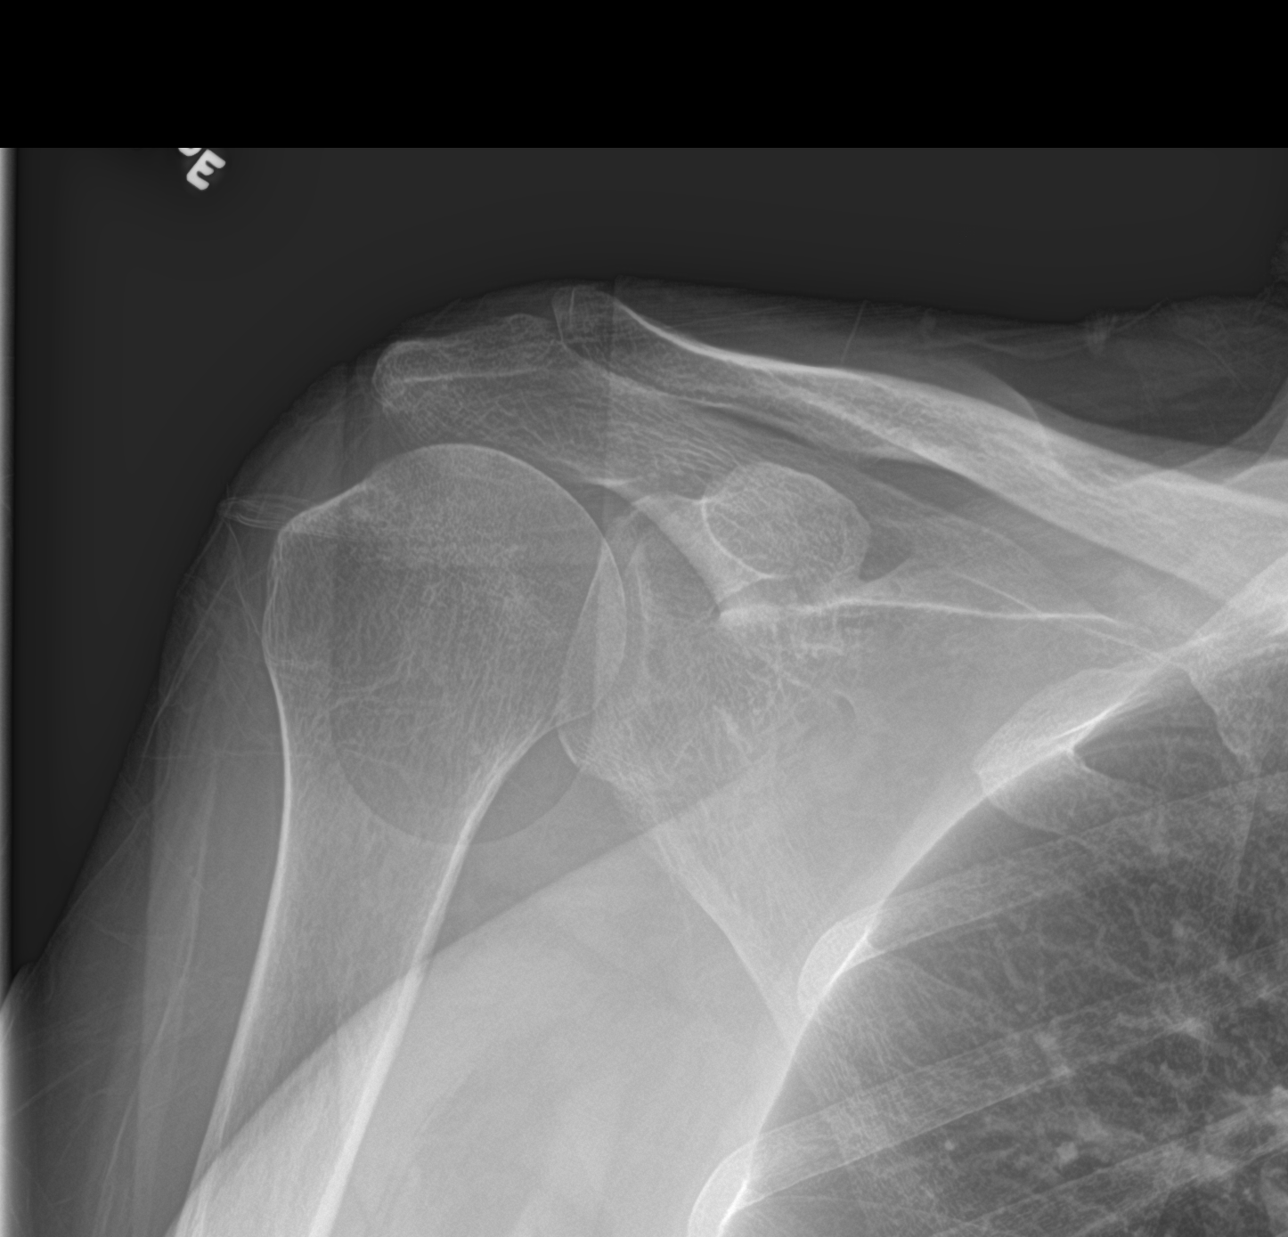

[4 of 4 positions shown; findings below may reference images not displayed]

FINDINGS: There is no evidence of fracture or dislocation. There is no
evidence of arthropathy or other focal bone abnormality. Soft
tissues are unremarkable.
IMPRESSION: Normal examination.

## 2018-06-10 ENCOUNTER — Inpatient Hospital Stay: Payer: PRIVATE HEALTH INSURANCE

## 2018-06-10 ENCOUNTER — Emergency Department: Payer: PRIVATE HEALTH INSURANCE

## 2018-06-10 ENCOUNTER — Other Ambulatory Visit: Payer: Self-pay

## 2018-06-10 ENCOUNTER — Inpatient Hospital Stay
Admission: EM | Admit: 2018-06-10 | Discharge: 2018-06-14 | DRG: 392 | Disposition: A | Discharge status: Home / Self Care | Payer: PRIVATE HEALTH INSURANCE | Attending: Internal Medicine | Admitting: Internal Medicine

## 2018-06-10 DIAGNOSIS — Z8042 Family history of malignant neoplasm of prostate: Secondary | ICD-10-CM

## 2018-06-10 DIAGNOSIS — Z91048 Other nonmedicinal substance allergy status: Secondary | ICD-10-CM

## 2018-06-10 DIAGNOSIS — R509 Fever, unspecified: Secondary | ICD-10-CM | POA: Diagnosis not present

## 2018-06-10 DIAGNOSIS — Z87440 Personal history of urinary (tract) infections: Secondary | ICD-10-CM | POA: Diagnosis not present

## 2018-06-10 DIAGNOSIS — Z9049 Acquired absence of other specified parts of digestive tract: Secondary | ICD-10-CM | POA: Diagnosis not present

## 2018-06-10 DIAGNOSIS — N189 Chronic kidney disease, unspecified: Secondary | ICD-10-CM | POA: Diagnosis present

## 2018-06-10 DIAGNOSIS — K529 Noninfective gastroenteritis and colitis, unspecified: Secondary | ICD-10-CM

## 2018-06-10 DIAGNOSIS — E876 Hypokalemia: Secondary | ICD-10-CM | POA: Diagnosis present

## 2018-06-10 DIAGNOSIS — Z882 Allergy status to sulfonamides status: Secondary | ICD-10-CM

## 2018-06-10 DIAGNOSIS — Z20828 Contact with and (suspected) exposure to other viral communicable diseases: Secondary | ICD-10-CM | POA: Diagnosis present

## 2018-06-10 DIAGNOSIS — Z79899 Other long term (current) drug therapy: Secondary | ICD-10-CM

## 2018-06-10 DIAGNOSIS — Z79890 Hormone replacement therapy (postmenopausal): Secondary | ICD-10-CM

## 2018-06-10 DIAGNOSIS — R51 Headache: Secondary | ICD-10-CM | POA: Diagnosis present

## 2018-06-10 DIAGNOSIS — Z9884 Bariatric surgery status: Secondary | ICD-10-CM

## 2018-06-10 DIAGNOSIS — Z791 Long term (current) use of non-steroidal anti-inflammatories (NSAID): Secondary | ICD-10-CM | POA: Diagnosis not present

## 2018-06-10 DIAGNOSIS — Z8041 Family history of malignant neoplasm of ovary: Secondary | ICD-10-CM

## 2018-06-10 DIAGNOSIS — Z90710 Acquired absence of both cervix and uterus: Secondary | ICD-10-CM

## 2018-06-10 DIAGNOSIS — Z8543 Personal history of malignant neoplasm of ovary: Secondary | ICD-10-CM | POA: Diagnosis not present

## 2018-06-10 DIAGNOSIS — K279 Peptic ulcer, site unspecified, unspecified as acute or chronic, without hemorrhage or perforation: Secondary | ICD-10-CM | POA: Diagnosis present

## 2018-06-10 DIAGNOSIS — K5909 Other constipation: Principal | ICD-10-CM | POA: Diagnosis present

## 2018-06-10 DIAGNOSIS — Z79891 Long term (current) use of opiate analgesic: Secondary | ICD-10-CM | POA: Diagnosis not present

## 2018-06-10 DIAGNOSIS — R109 Unspecified abdominal pain: Secondary | ICD-10-CM

## 2018-06-10 DIAGNOSIS — Z8051 Family history of malignant neoplasm of kidney: Secondary | ICD-10-CM | POA: Diagnosis not present

## 2018-06-10 DIAGNOSIS — R10A1 Flank pain, right side: Secondary | ICD-10-CM

## 2018-06-10 LAB — CBC WITH DIFFERENTIAL/PLATELET
Abs Immature Granulocytes: 0.03 10*3/uL (ref 0.00–0.07)
Basophils Absolute: 0 10*3/uL (ref 0.0–0.1)
Basophils Relative: 0 %
Eosinophils Absolute: 0 10*3/uL (ref 0.0–0.5)
Eosinophils Relative: 0 %
HCT: 37.3 % (ref 36.0–46.0)
Hemoglobin: 12.9 g/dL (ref 12.0–15.0)
Immature Granulocytes: 0 %
Lymphocytes Relative: 24 %
Lymphs Abs: 2.3 10*3/uL (ref 0.7–4.0)
MCH: 27.8 pg (ref 26.0–34.0)
MCHC: 34.6 g/dL (ref 30.0–36.0)
MCV: 80.4 fL (ref 80.0–100.0)
Monocytes Absolute: 0.3 10*3/uL (ref 0.1–1.0)
Monocytes Relative: 4 %
Neutro Abs: 6.8 10*3/uL (ref 1.7–7.7)
Neutrophils Relative %: 72 %
Platelets: 203 10*3/uL (ref 150–400)
RBC: 4.64 MIL/uL (ref 3.87–5.11)
RDW: 13.2 % (ref 11.5–15.5)
WBC: 9.6 10*3/uL (ref 4.0–10.5)
nRBC: 0 % (ref 0.0–0.2)

## 2018-06-10 LAB — URINALYSIS, COMPLETE (UACMP) WITH MICROSCOPIC
Bacteria, UA: NONE SEEN
Bilirubin Urine: NEGATIVE
Glucose, UA: NEGATIVE mg/dL
Ketones, ur: 20 mg/dL — AB
Leukocytes,Ua: NEGATIVE
Nitrite: NEGATIVE
Protein, ur: NEGATIVE mg/dL
Specific Gravity, Urine: 1.013 (ref 1.005–1.030)
pH: 7 (ref 5.0–8.0)

## 2018-06-10 LAB — COMPREHENSIVE METABOLIC PANEL
ALT: 18 U/L (ref 0–44)
AST: 29 U/L (ref 15–41)
Albumin: 4.4 g/dL (ref 3.5–5.0)
Alkaline Phosphatase: 126 U/L (ref 38–126)
Anion gap: 13 (ref 5–15)
BUN: 9 mg/dL (ref 6–20)
CO2: 25 mmol/L (ref 22–32)
Calcium: 8.8 mg/dL — ABNORMAL LOW (ref 8.9–10.3)
Chloride: 100 mmol/L (ref 98–111)
Creatinine, Ser: 0.67 mg/dL (ref 0.44–1.00)
GFR calc Af Amer: >60 mL/min (ref >60)
GFR calc non Af Amer: >60 mL/min (ref >60)
Glucose, Bld: 87 mg/dL (ref 70–99)
Potassium: 2.8 mmol/L — ABNORMAL LOW (ref 3.5–5.1)
Sodium: 138 mmol/L (ref 135–145)
Total Bilirubin: 0.6 mg/dL (ref 0.3–1.2)
Total Protein: 7.1 g/dL (ref 6.5–8.1)

## 2018-06-10 LAB — POTASSIUM: Potassium: 3.1 mmol/L — ABNORMAL LOW (ref 3.5–5.1)

## 2018-06-10 LAB — SARS CORONAVIRUS 2 BY RT PCR (HOSPITAL ORDER, PERFORMED IN ~~LOC~~ HOSPITAL LAB): SARS Coronavirus 2: NEGATIVE

## 2018-06-10 LAB — LIPASE, BLOOD: Lipase: 26 U/L (ref 11–51)

## 2018-06-10 LAB — MAGNESIUM: Magnesium: 1.8 mg/dL (ref 1.7–2.4)

## 2018-06-10 MED ORDER — LEVOFLOXACIN IN D5W 500 MG/100ML IV SOLN
500.0000 mg | Freq: Once | INTRAVENOUS | Status: AC
  Administered 2018-06-10: 500 mg via INTRAVENOUS
  Filled 2018-06-10: qty 100

## 2018-06-10 MED ORDER — ENOXAPARIN SODIUM 40 MG/0.4ML ~~LOC~~ SOLN
40.0000 mg | SUBCUTANEOUS | Status: DC
  Administered 2018-06-10 – 2018-06-13 (×4): 40 mg via SUBCUTANEOUS
  Filled 2018-06-10 (×4): qty 0.4

## 2018-06-10 MED ORDER — ONDANSETRON HCL 4 MG/2ML IJ SOLN
4.0000 mg | Freq: Once | INTRAMUSCULAR | Status: AC
  Administered 2018-06-10: 4 mg via INTRAVENOUS
  Filled 2018-06-10: qty 2

## 2018-06-10 MED ORDER — METRONIDAZOLE IN NACL 5-0.79 MG/ML-% IV SOLN
500.0000 mg | Freq: Once | INTRAVENOUS | Status: AC
  Administered 2018-06-10: 500 mg via INTRAVENOUS
  Filled 2018-06-10: qty 100

## 2018-06-10 MED ORDER — POTASSIUM CHLORIDE 20 MEQ/15ML (10%) PO SOLN
40.0000 meq | Freq: Once | ORAL | Status: AC
  Administered 2018-06-10: 40 meq via ORAL
  Filled 2018-06-10: qty 30

## 2018-06-10 MED ORDER — LEVOFLOXACIN IN D5W 500 MG/100ML IV SOLN
500.0000 mg | INTRAVENOUS | Status: DC
  Administered 2018-06-11 – 2018-06-13 (×3): 500 mg via INTRAVENOUS
  Filled 2018-06-10 (×4): qty 100

## 2018-06-10 MED ORDER — MORPHINE SULFATE (PF) 2 MG/ML IV SOLN
INTRAVENOUS | Status: AC
  Filled 2018-06-10: qty 1

## 2018-06-10 MED ORDER — METRONIDAZOLE IN NACL 5-0.79 MG/ML-% IV SOLN
500.0000 mg | Freq: Three times a day (TID) | INTRAVENOUS | Status: DC
  Administered 2018-06-11 – 2018-06-14 (×10): 500 mg via INTRAVENOUS
  Filled 2018-06-10 (×14): qty 100

## 2018-06-10 MED ORDER — MORPHINE SULFATE (PF) 2 MG/ML IV SOLN
2.0000 mg | INTRAVENOUS | Status: DC | PRN
  Administered 2018-06-10 – 2018-06-12 (×4): 2 mg via INTRAVENOUS
  Filled 2018-06-10 (×4): qty 1

## 2018-06-10 MED ORDER — SODIUM CHLORIDE 0.9 % IV SOLN
INTRAVENOUS | Status: DC
  Administered 2018-06-10 – 2018-06-13 (×6): via INTRAVENOUS

## 2018-06-10 MED ORDER — HYDROMORPHONE HCL 1 MG/ML IJ SOLN
0.5000 mg | Freq: Once | INTRAMUSCULAR | Status: AC
  Administered 2018-06-10: 0.5 mg via INTRAVENOUS
  Filled 2018-06-10: qty 1

## 2018-06-10 MED ORDER — DIPHENHYDRAMINE HCL 25 MG PO CAPS
25.0000 mg | ORAL_CAPSULE | Freq: Every evening | ORAL | Status: DC | PRN
  Administered 2018-06-11 – 2018-06-13 (×3): 25 mg via ORAL
  Filled 2018-06-10 (×4): qty 1

## 2018-06-10 MED ORDER — ONDANSETRON HCL 4 MG/2ML IJ SOLN
4.0000 mg | Freq: Four times a day (QID) | INTRAMUSCULAR | Status: DC | PRN
  Administered 2018-06-10 – 2018-06-13 (×3): 4 mg via INTRAVENOUS
  Filled 2018-06-10 (×3): qty 2

## 2018-06-10 NOTE — ED Notes (Signed)
Pt was at walk in clinic for pain in right side with fever, chills, headache - pt has been sick x2 days - no Covid exposure per pt

## 2018-06-10 NOTE — ED Notes (Signed)
Patient transported to CT 

## 2018-06-10 NOTE — ED Notes (Signed)
Lab notified that phlebotomist needed to draw the second set of cultures.

## 2018-06-10 NOTE — H&P (Signed)
[facility] [place] NAME: Tyona Nilsen    MR#:  [id number]  DATE OF BIRTH:  [birth year]  DATE OF ADMISSION:  06/10/2018  PRIMARY CARE PHYSICIAN: Patient, No Pcp Per   REQUESTING/REFERRING PHYSICIAN: Corinna Capra  CHIEF COMPLAINT:   Chief Complaint  Patient presents with  . Flank Pain    HISTORY OF PRESENT ILLNESS:  Rucha Wissinger  is a 48 y.o. female with a known history of ovarian cancer who presented to the emergency room with complaints of abdominal pains.  Mostly in the right lower quadrant.  Reported having some fevers at home.  Temperature one 1.5 yesterday for which patient took Tylenol.  Symptoms have been going on for 2 days.  Patient also complains of headaches.  No neck stiffness.  No confusion.  No nausea vomiting or diarrhea.  Patient was evaluated in the emergency room and found to have hypokalemia with potassium of 2.8 which is being replaced.  No leukocytosis.  COVID test negative.  CT scan; renal stone study revealed findings concerning for colitis.  Patient started empirically on IV levofloxacin and Flagyl.  Medical service called to admit patient for further evaluation and management.  PAST MEDICAL HISTORY:   Past Medical History:  Diagnosis Date  . BRCA negative 11/2016   MyRisk neg, except BRCA 1 VUS; IBIS=5.2%  . Chronic kidney disease   . Family history of ovarian cancer   . Urinary (tract) obstruction     PAST SURGICAL HISTORY:   Past Surgical History:  Procedure Laterality Date  . ABDOMINAL HYSTERECTOMY    . APPENDECTOMY    . BACK SURGERY    . CHOLECYSTECTOMY    . PERIPHERAL VASCULAR CATHETERIZATION  10/02/2014   Procedure: PICC Line Insertion;  Surgeon: Algernon Huxley, MD;  Location: [facility];  Service: Cardiovascular;;  . TONSILLECTOMY Bilateral     SOCIAL HISTORY:   Social History   Tobacco Use  . Smoking status: Never Smoker  . Smokeless tobacco: Never Used  Substance Use Topics  .  Alcohol use: No    FAMILY HISTORY:   Family History  Problem Relation Age of Onset  . Heart Problems Brother   . Prostate cancer Brother 65       pat 1/2 brother  . Ovarian cancer Sister 39       pat 1/2 sister  . Cancer Sister 11       pat 1/2 sister--hepatobiliary  . Kidney cancer Maternal Uncle 50    DRUG ALLERGIES:   Allergies  Allergen Reactions  . Sulfa Antibiotics Rash and Shortness Of Breath  . Other     Pork- religion contradiction    . Tape     Other reaction(s): Other (See Comments) PLASTIC MEDICAL TAPE, Unknown Kind of Tape    REVIEW OF SYSTEMS:   Review of Systems  Constitutional: Positive for chills and fever.  HENT: Negative for hearing loss and tinnitus.   Eyes: Negative for blurred vision and double vision.  Respiratory: Negative for cough, hemoptysis and shortness of breath.   Cardiovascular: Negative for chest pain and palpitations.  Gastrointestinal: Positive for abdominal pain. Negative for heartburn, nausea and vomiting.  Genitourinary: Negative for dysuria and urgency.  Musculoskeletal: Negative for myalgias.  Skin: Negative for itching and rash.  Neurological: Negative for dizziness and tingling.  Psychiatric/Behavioral: Negative for depression and substance abuse.    MEDICATIONS AT HOME:   Prior to Admission medications   Medication Sig Start  Date End Date Taking? Authorizing Provider  estrogens, conjugated, (PREMARIN) 0.3 MG tablet Take 1 tablet (0.3 mg total) by mouth daily. Take daily for 21 days then do not take for 7 days. 09/05/17  Yes Gae Dry, MD      VITAL SIGNS:  Blood pressure 136/83, pulse 87, temperature 99 F (37.2 C), resp. rate 16, height 5' 8" (1.727 m), weight 79.4 kg, SpO2 98 %.  PHYSICAL EXAMINATION:  Physical Exam  GENERAL:  48 y.o.-year-old patient lying in the bed with no acute distress.  EYES: Pupils equal, round, reactive to light and accommodation. No scleral icterus. Extraocular muscles intact.   HEENT: Head atraumatic, normocephalic. Oropharynx and nasopharynx clear.  NECK:  Supple, no jugular venous distention. No thyroid enlargement, no tenderness.  LUNGS: Normal breath sounds bilaterally, no wheezing, rales,rhonchi or crepitation. No use of accessory muscles of respiration.  CARDIOVASCULAR: S1, S2 normal. No murmurs, rubs, or gallops.  ABDOMEN: Soft, mild right lower quadrant tenderness.  No rebound or guarding.  Bowel sounds present. No organomegaly or mass.  EXTREMITIES: No pedal edema, cyanosis, or clubbing.  NEUROLOGIC: Cranial nerves II through XII are intact. Muscle strength 5/5 in all extremities. Sensation intact. Gait not checked.  PSYCHIATRIC: The patient is alert and oriented x 3.  SKIN: No obvious rash, lesion, or ulcer.   LABORATORY PANEL:   CBC Recent Labs  Lab 06/10/18 1535  WBC 9.6  HGB 12.9  HCT 37.3  PLT 203   ------------------------------------------------------------------------------------------------------------------  Chemistries  Recent Labs  Lab 06/10/18 1535  NA 138  K 2.8*  CL 100  CO2 25  GLUCOSE 87  BUN 9  CREATININE 0.67  CALCIUM 8.8*  AST 29  ALT 18  ALKPHOS 126  BILITOT 0.6   ------------------------------------------------------------------------------------------------------------------  Cardiac Enzymes No results for input(s): TROPONINI in the last 168 hours. ------------------------------------------------------------------------------------------------------------------  RADIOLOGY:  Ct Renal Stone Study  Addendum Date: 06/10/2018   ADDENDUM REPORT: 06/10/2018 18:11 ADDENDUM: Upon further review, there is noted mild inflammatory changes around the colon suggesting mild inflammatory or infectious colitis. Also noted are several enlarged mesenteric lymph nodes in the right lower quadrant which most likely are inflammatory in etiology. These results were called by telephone at the time of interpretation on 06/10/2018 at 6:11  pm to Dr. Conni Slipper , who verbally acknowledged these results. Electronically Signed   By: Marijo Conception M.D.   On: 06/10/2018 18:11   Result Date: 06/10/2018 CLINICAL DATA:  Acute right-sided abdominal pain. EXAM: CT ABDOMEN AND PELVIS WITHOUT CONTRAST TECHNIQUE: Multidetector CT imaging of the abdomen and pelvis was performed following the standard protocol without IV contrast. COMPARISON:  CT scan of February 14, 2016. FINDINGS: Lower chest: No acute abnormality. Hepatobiliary: No focal liver abnormality is seen. Status post cholecystectomy. No biliary dilatation. Pancreas: Unremarkable. No pancreatic ductal dilatation or surrounding inflammatory changes. Spleen: Normal in size without focal abnormality. Adrenals/Urinary Tract: Adrenal glands are unremarkable. Kidneys are normal, without renal calculi, focal lesion, or hydronephrosis. Bladder is unremarkable. Stomach/Bowel: Status post gastric bypass. There is no evidence of bowel obstruction or inflammation. Status post appendectomy. Vascular/Lymphatic: Aortic atherosclerosis. No enlarged abdominal or pelvic lymph nodes. Reproductive: Status post hysterectomy. No adnexal masses. Other: No abdominal wall hernia or abnormality. No abdominopelvic ascites. Musculoskeletal: No acute or significant osseous findings. IMPRESSION: No acute abnormality seen in the abdomen or pelvis. Aortic Atherosclerosis (ICD10-I70.0). Electronically Signed: By: Marijo Conception M.D. On: 06/10/2018 16:31      IMPRESSION AND PLAN:  Patient  is a 48 year old female being admitted for treatment of colitis.  1.  Colitis Presented with right lower quadrant abdominal pains.  Findings of colitis on CT scan. Placed empirically on IV antibiotics with IV levofloxacin and Flagyl with plans to transition to p.o. antibiotics to complete total of 10-day treatment duration on discharge. IV fluid hydration.  PRN morphine for pain control.  Clear liquid diet with plans to advance as  tolerated.  2.  Hypokalemia Being replaced.  Follow-up on repeat levels as well as magnesium level.  3.  Severe headaches. No neck stiffness or any meningeal signs. Requested for CT head to rule out acute intracranial finding.  DVT prophylaxis; Lovenox  All the records are reviewed and case discussed with ED provider. Management plans discussed with the patient, family and they are in agreement.  CODE STATUS: Full code  TOTAL TIME TAKING CARE OF THIS PATIENT: 58 minutes.    Jude Ojie M.D on 06/10/2018 at 8:31 PM  Between 7am to 6pm - Pager - [phone]  After 6pm go to [email or web address] - Proofreader  [facility]  Office  [phone]  CC: Primary care physician; Patient, No Pcp Per   Note: This dictation was prepared with Dragon dictation along with smaller phrase technology. Any transcriptional errors that result from this process are unintentional.

## 2018-06-10 NOTE — ED Notes (Signed)
ED TO INPATIENT HANDOFF REPORT  ED Nurse Name and Phone #:  Eileen Stanford [phone]  S Name/Age/Gender Vickie Turner 48 y.o. female Room/Bed: ED35A/ED35A  Code Status   Code Status: Full Code  Home/SNF/Other Home Patient oriented to: self, place, time and situation Is this baseline? Yes   Triage Complete: Triage complete  Chief Complaint Headache/Fever  Triage Note Pt reports ha, chills, and generalized body aches   Allergies Allergies  Allergen Reactions  . Sulfa Antibiotics Rash and Shortness Of Breath  . Other     Pork- religion contradiction    . Tape     Other reaction(s): Other (See Comments) PLASTIC MEDICAL TAPE, Unknown Kind of Tape    Level of Care/Admitting Diagnosis ED Disposition    ED Disposition Condition [place]: [place] [[id number]]  Level of Care: Med-Surg [16]  Covid Evaluation: Screening Protocol (No Symptoms)  Diagnosis: Colitis [030092]  Admitting Physician: Otila Back [place]  Attending Physician: Otila Back [[id number]]  Estimated length of stay: past midnight tomorrow  Certification:: I certify this patient will need inpatient services for at least 2 midnights  PT Class (Do Not Modify): Inpatient [101]  PT Acc Code (Do Not Modify): Private [1]       B Medical/Surgery History Past Medical History:  Diagnosis Date  . BRCA negative 11/2016   MyRisk neg, except BRCA 1 VUS; IBIS=5.2%  . Chronic kidney disease   . Family history of ovarian cancer   . Urinary (tract) obstruction    Past Surgical History:  Procedure Laterality Date  . ABDOMINAL HYSTERECTOMY    . APPENDECTOMY    . BACK SURGERY    . CHOLECYSTECTOMY    . PERIPHERAL VASCULAR CATHETERIZATION  10/02/2014   Procedure: PICC Line Insertion;  Surgeon: Algernon Huxley, MD;  Location: [facility];  Service: Cardiovascular;;  . TONSILLECTOMY Bilateral      A IV Location/Drains/Wounds Patient Lines/Drains/Airways Status   Active  Line/Drains/Airways    Name:   Placement date:   Placement time:   Site:   Days:   Peripheral IV 06/10/18 Right Hand   06/10/18    1535    Hand   less than 1          Intake/Output Last 24 hours No intake or output data in the 24 hours ending 06/10/18 2102  Labs/Imaging Results for orders placed or performed during the hospital encounter of 06/10/18 (from the past 48 hour(s))  Comprehensive metabolic panel     Status: Abnormal   Collection Time: 06/10/18  3:35 PM  Result Value Ref Range   Sodium 138 135 - 145 mmol/L   Potassium 2.8 (L) 3.5 - 5.1 mmol/L   Chloride 100 98 - 111 mmol/L   CO2 25 22 - 32 mmol/L   Glucose, Bld 87 70 - 99 mg/dL   BUN 9 6 - 20 mg/dL   Creatinine, Ser 0.67 0.44 - 1.00 mg/dL   Calcium 8.8 (L) 8.9 - 10.3 mg/dL   Total Protein 7.1 6.5 - 8.1 g/dL   Albumin 4.4 3.5 - 5.0 g/dL   AST 29 15 - 41 U/L   ALT 18 0 - 44 U/L   Alkaline Phosphatase 126 38 - 126 U/L   Total Bilirubin 0.6 0.3 - 1.2 mg/dL   GFR calc non Af Amer >60 >60 mL/min   GFR calc Af Amer >60 >60 mL/min   Anion gap 13 5 - 15  Comment: Performed at [facility], [address] [place], [place] [address]  Lipase, blood     Status: None   Collection Time: 06/10/18  3:35 PM  Result Value Ref Range   Lipase 26 11 - 51 U/L    Comment: Performed at [facility], [address], [place] [address]  CBC with Differential     Status: None   Collection Time: 06/10/18  3:35 PM  Result Value Ref Range   WBC 9.6 4.0 - 10.5 K/uL   RBC 4.64 3.87 - 5.11 MIL/uL   Hemoglobin 12.9 12.0 - 15.0 g/dL   HCT 37.3 36.0 - 46.0 %   MCV 80.4 80.0 - 100.0 fL   MCH 27.8 26.0 - 34.0 pg   MCHC 34.6 30.0 - 36.0 g/dL   RDW 13.2 11.5 - 15.5 %   Platelets 203 150 - 400 K/uL   nRBC 0.0 0.0 - 0.2 %   Neutrophils Relative % 72 %   Neutro Abs 6.8 1.7 - 7.7 K/uL   Lymphocytes Relative 24 %   Lymphs Abs 2.3 0.7 - 4.0 K/uL   Monocytes Relative 4 %   Monocytes Absolute 0.3 0.1 - 1.0 K/uL    Eosinophils Relative 0 %   Eosinophils Absolute 0.0 0.0 - 0.5 K/uL   Basophils Relative 0 %   Basophils Absolute 0.0 0.0 - 0.1 K/uL   Immature Granulocytes 0 %   Abs Immature Granulocytes 0.03 0.00 - 0.07 K/uL    Comment: Performed at [facility], [address] [place], [place] [address]  Urinalysis, Complete w Microscopic     Status: Abnormal   Collection Time: 06/10/18  4:52 PM  Result Value Ref Range   Color, Urine YELLOW (A) YELLOW   APPearance CLEAR (A) CLEAR   Specific Gravity, Urine 1.013 1.005 - 1.030   pH 7.0 5.0 - 8.0   Glucose, UA NEGATIVE NEGATIVE mg/dL   Hgb urine dipstick SMALL (A) NEGATIVE   Bilirubin Urine NEGATIVE NEGATIVE   Ketones, ur 20 (A) NEGATIVE mg/dL   Protein, ur NEGATIVE NEGATIVE mg/dL   Nitrite NEGATIVE NEGATIVE   Leukocytes,Ua NEGATIVE NEGATIVE   RBC / HPF 0-5 0 - 5 RBC/hpf   WBC, UA 0-5 0 - 5 WBC/hpf   Bacteria, UA NONE SEEN NONE SEEN   Squamous Epithelial / LPF 0-5 0 - 5   Mucus PRESENT     Comment: Performed at [facility], [address], [place], [place] [address]  SARS Coronavirus 2 (CEPHEID - Performed in [facility] hospital lab), Hosp Order     Status: None   Collection Time: 06/10/18  4:52 PM  Result Value Ref Range   SARS Coronavirus 2 NEGATIVE NEGATIVE    Comment: (NOTE) If result is NEGATIVE SARS-CoV-2 target nucleic acids are NOT DETECTED. The SARS-CoV-2 RNA is generally detectable in upper and lower  respiratory specimens during the acute phase of infection. The lowest  concentration of SARS-CoV-2 viral copies this assay can detect is 250  copies / mL. A negative result does not preclude SARS-CoV-2 infection  and should not be used as the sole basis for treatment or other  patient management decisions.  A negative result may occur with  improper specimen collection / handling, submission of specimen other  than nasopharyngeal swab, presence of viral mutation(s) within the  areas targeted by this assay, and  inadequate number of viral copies  (<250 copies / mL). A negative result must be combined with clinical  observations, patient history, and epidemiological information. If result is POSITIVE SARS-CoV-2 target nucleic acids are DETECTED. The SARS-CoV-2 RNA is generally detectable in upper and lower  respiratory specimens dur ing the acute phase of infection.  Positive  results are indicative of active infection with SARS-CoV-2.  Clinical  correlation with patient history and other diagnostic information is  necessary to determine patient infection status.  Positive results do  not rule out bacterial infection or co-infection with other viruses. If result is PRESUMPTIVE POSTIVE SARS-CoV-2 nucleic acids MAY BE PRESENT.   A presumptive positive result was obtained on the submitted specimen  and confirmed on repeat testing.  While 2019 novel coronavirus  (SARS-CoV-2) nucleic acids may be present in the submitted sample  additional confirmatory testing may be necessary for epidemiological  and / or clinical management purposes  to differentiate between  SARS-CoV-2 and other Sarbecovirus currently known to infect humans.  If clinically indicated additional testing with an alternate test  methodology [phone]) is advised. The SARS-CoV-2 RNA is generally  detectable in upper and lower respiratory sp ecimens during the acute  phase of infection. The expected result is Negative. Fact Sheet for Patients:  StrictlyIdeas.no Fact Sheet for Healthcare Providers: BankingDealers.co.za This test is not yet approved or cleared by the [place] FDA and has been authorized for detection and/or diagnosis of SARS-CoV-2 by FDA under an Emergency Use Authorization (EUA).  This EUA will remain in effect (meaning this test can be used) for the duration of the COVID-19 declaration under Section 564(b)(1) of the Act, 21 U.S.C. section 360bbb-3(b)(1), unless the  authorization is terminated or revoked sooner. Performed at [facility], [place], [place] [address]    Ct Head Wo Contrast  Result Date: 06/10/2018 CLINICAL DATA:  Acute headache EXAM: CT HEAD WITHOUT CONTRAST TECHNIQUE: Contiguous axial images were obtained from the base of the skull through the vertex without intravenous contrast. COMPARISON:  None. FINDINGS: Brain: There is no mass, hemorrhage or extra-axial collection. The size and configuration of the ventricles and extra-axial CSF spaces are normal. The brain parenchyma is normal, without acute or chronic infarction. Vascular: No abnormal hyperdensity of the major intracranial arteries or dural venous sinuses. No intracranial atherosclerosis. Skull: The visualized skull base, calvarium and extracranial soft tissues are normal. Sinuses/Orbits: No fluid levels or advanced mucosal thickening of the visualized paranasal sinuses. No mastoid or middle ear effusion. The orbits are normal. IMPRESSION: Normal head CT Electronically Signed   By: Ulyses Jarred M.D.   On: 06/10/2018 20:58   Ct Renal Stone Study  Addendum Date: 06/10/2018   ADDENDUM REPORT: 06/10/2018 18:11 ADDENDUM: Upon further review, there is noted mild inflammatory changes around the colon suggesting mild inflammatory or infectious colitis. Also noted are several enlarged mesenteric lymph nodes in the right lower quadrant which most likely are inflammatory in etiology. These results were called by telephone at the time of interpretation on 06/10/2018 at 6:11 pm to Dr. Conni Slipper , who verbally acknowledged these results. Electronically Signed   By: Marijo Conception M.D.   On: 06/10/2018 18:11   Result Date: 06/10/2018 CLINICAL DATA:  Acute right-sided abdominal pain. EXAM: CT ABDOMEN AND PELVIS WITHOUT CONTRAST TECHNIQUE: Multidetector CT imaging of the abdomen and pelvis was performed following the standard protocol without IV contrast. COMPARISON:  CT scan of  February 14, 2016. FINDINGS: Lower chest: No acute abnormality. Hepatobiliary: No focal liver abnormality is seen. Status post cholecystectomy. No biliary dilatation. Pancreas: Unremarkable. No pancreatic  ductal dilatation or surrounding inflammatory changes. Spleen: Normal in size without focal abnormality. Adrenals/Urinary Tract: Adrenal glands are unremarkable. Kidneys are normal, without renal calculi, focal lesion, or hydronephrosis. Bladder is unremarkable. Stomach/Bowel: Status post gastric bypass. There is no evidence of bowel obstruction or inflammation. Status post appendectomy. Vascular/Lymphatic: Aortic atherosclerosis. No enlarged abdominal or pelvic lymph nodes. Reproductive: Status post hysterectomy. No adnexal masses. Other: No abdominal wall hernia or abnormality. No abdominopelvic ascites. Musculoskeletal: No acute or significant osseous findings. IMPRESSION: No acute abnormality seen in the abdomen or pelvis. Aortic Atherosclerosis (ICD10-I70.0). Electronically Signed: By: Marijo Conception M.D. On: 06/10/2018 16:31    Pending Labs Unresulted Labs (From admission, onward)    Start     Ordered   06/11/18 2353  Basic metabolic panel  Tomorrow morning,   STAT     06/10/18 2031   06/11/18 0500  CBC  Tomorrow morning,   STAT     06/10/18 2031   06/11/18 0500  Magnesium  Tomorrow morning,   STAT     06/10/18 2031   06/10/18 2230  Magnesium  Once,   STAT     06/10/18 2031   06/10/18 2200  Potassium  Once,   STAT     06/10/18 2031   06/10/18 2026  HIV antibody (Routine Testing)  Once,   STAT     06/10/18 2027   06/10/18 1545  Urine culture  Once,   STAT     06/10/18 1545   06/10/18 1545  Culture, blood (routine x 2)  BLOOD CULTURE X 2,   STAT     06/10/18 1545          Vitals/Pain Today's Vitals   06/10/18 1521 06/10/18 1522 06/10/18 1735  BP: 136/83    Pulse: 87    Resp: 16    Temp: 99 F (37.2 C)    SpO2: 98%    Weight:  79.4 kg   Height:  5' 8" (1.727 m)   PainSc:  10-Worst pain ever  0-No pain    Isolation Precautions No active isolations  Medications Medications  levofloxacin (LEVAQUIN) IVPB 500 mg (500 mg Intravenous New Bag/Given 06/10/18 2100)  metroNIDAZOLE (FLAGYL) IVPB 500 mg (500 mg Intravenous New Bag/Given 06/10/18 2023)  enoxaparin (LOVENOX) injection 40 mg (has no administration in time range)  levofloxacin (LEVAQUIN) IVPB 500 mg (has no administration in time range)  metroNIDAZOLE (FLAGYL) IVPB 500 mg (has no administration in time range)  0.9 %  sodium chloride infusion (has no administration in time range)  morphine 2 MG/ML injection 2 mg (has no administration in time range)  HYDROmorphone (DILAUDID) injection 0.5 mg (0.5 mg Intravenous Given 06/10/18 1700)  ondansetron (ZOFRAN) injection 4 mg (4 mg Intravenous Given 06/10/18 1700)  potassium chloride 20 MEQ/15ML (10%) solution 40 mEq (40 mEq Oral Given 06/10/18 1953)    Mobility walks Low fall risk   Focused Assessments n/a   R Recommendations: See Admitting Provider Note  Report given to:   Additional Notes: n/a

## 2018-06-10 NOTE — ED Provider Notes (Signed)
[facility] Emergency Department Provider Note  _______________________   First MD Initiated Contact with Patient 06/10/18 1509     (approximate)  I have reviewed the triage vital signs and the nursing notes.   HISTORY  Chief Complaint Flank Pain    HPI Vickie Turner is a 48 y.o. female patient reports she is been sick for 2 days with fever chills and a headache.  The night after this came on she felt achy all over as though someone had beat her.  However now the pain is more localized in the right upper quadrant radiating down toward the groin.  Both very right upper quadrant and the right CVA area are tender to palpation and percussion.  Patient does not have a gallbladder or appendix or uterus.  They have all been surgically removed.         Past Medical History:  Diagnosis Date   BRCA negative 11/2016   MyRisk neg, except BRCA 1 VUS; IBIS=5.2%   Chronic kidney disease    Family history of ovarian cancer    Urinary (tract) obstruction     Patient Active Problem List   Diagnosis Date Noted   Family history of ovarian cancer 12/27/2016   CIN I (cervical intraepithelial neoplasia I) 07/24/2016   Iron deficiency anemia 08/09/2015   Easy bruising 08/09/2015   SOB (shortness of breath) 11/14/2013   Palpitations 11/14/2013    Past Surgical History:  Procedure Laterality Date   ABDOMINAL HYSTERECTOMY     APPENDECTOMY     BACK SURGERY     CHOLECYSTECTOMY     PERIPHERAL VASCULAR CATHETERIZATION  10/02/2014   Procedure: PICC Line Insertion;  Surgeon: Algernon Huxley, MD;  Location: [place] [facility];  Service: Cardiovascular;;   TONSILLECTOMY Bilateral     Prior to Admission medications   Medication Sig Start Date End Date Taking? Authorizing Provider  celecoxib (CELEBREX) 200 MG capsule TAKE 1 CAPSULE BY MOUTH DAILY FOR ARTHRITIS 12/11/17   Lavera Guise, MD  cyanocobalamin (,VITAMIN B-12,) 1000 MCG/ML injection INJECT 1 ML  WEEKLY FOR 3 WEEKS AS DIRECTED AND THEN ONCE A MONTH FOR ANEMIA 06/07/14   [provider]  cyclobenzaprine (FLEXERIL) 10 MG tablet TAKE ONE TABLET BY MOUTH TWICE DAILY 09/07/17   Lavera Guise, MD  diazepam (VALIUM) 2 MG tablet Take 1 tablet (2 mg total) by mouth every 6 (six) hours as needed for anxiety. 06/03/16   Loney Hering, MD  estrogens, conjugated, (PREMARIN) 0.3 MG tablet Take 1 tablet (0.3 mg total) by mouth daily. Take daily for 21 days then do not take for 7 days. 09/05/17   Gae Dry, MD  nitrofurantoin, macrocrystal-monohydrate, (MACROBID) 100 MG capsule Take by mouth. 06/10/15   [provider]  omeprazole (PRILOSEC) 40 MG capsule TAKE 1 CAPSULE BY MOUTH ONCE DAILY 12/11/17   Lavera Guise, MD  oxyCODONE (OXY IR/ROXICODONE) 5 MG immediate release tablet Take 1 tablet (5 mg total) by mouth every 6 (six) hours as needed for severe pain. 02/14/16   Delman Kitten, MD  traMADol (ULTRAM) 50 MG tablet TAKE 1 TABLET BY MOUTH TWICE A DAY AS NEEDED FOR 07/25/17   Lavera Guise, MD  zolpidem (AMBIEN) 10 MG tablet TAKE ONE TABLET BY MOUTH AT BEDTIME AS NEEDED FOR INSOMNIA 10/01/17   Lavera Guise, MD    Allergies Sulfa antibiotics; Other; and Tape  Family History  Problem Relation Age of Onset   Heart Problems  Brother    Prostate cancer Brother 60       pat 1/2 brother   Ovarian cancer Sister 4       pat 1/2 sister   Cancer Sister 54       pat 1/2 sister--hepatobiliary   Kidney cancer Maternal Uncle 43    Social History Social History   Tobacco Use   Smoking status: Never Smoker   Smokeless tobacco: Never Used  Substance Use Topics   Alcohol use: No   Drug use: No    Review of Systems  Constitutional:  fever/chills Eyes: No visual changes. ENT: No sore throat. Cardiovascular: Denies chest pain. Respiratory: Denies shortness of breath. Gastrointestinal:  abdominal pain.  No nausea, no vomiting.  No diarrhea.  No constipation. Genitourinary:  Negative for dysuria. Musculoskeletal: Negative for back pain. Skin: Negative for rash. Neurological: Negative for headaches, focal weakness  ____________________________________________   PHYSICAL EXAM:  VITAL SIGNS: ED Triage Vitals  Enc Vitals Group     BP 06/10/18 1521 136/83     Pulse Rate 06/10/18 1521 87     Resp 06/10/18 1521 16     Temp 06/10/18 1521 99 F (37.2 C)     Temp src --      SpO2 06/10/18 1521 98 %     Weight 06/10/18 1522 175 lb (79.4 kg)     Height 06/10/18 1522 5' 8" (1.727 m)     Head Circumference --      Peak Flow --      Pain Score 06/10/18 1521 10     Pain Loc --      Pain Edu? --      Excl. in [place]? --     Constitutional: Alert and oriented. WIl appearing and in  acute distress. Eyes: Conjunctivae are normal. Head: Atraumatic. Nose: No congestion/rhinnorhea. Mouth/Throat: Mucous membranes are moist.  Oropharynx non-erythematous. Neck: No stridor. Cardiovascular: Normal rate, regular rhythm. Grossly normal heart sounds.  Good peripheral circulation. Respiratory: Normal respiratory effort.  No retractions. Lungs CTAB. Gastrointestinal: Soft tender to palpation right upper quadrant. No distention. No abdominal bruits.  Right CVA tenderness. Musculoskeletal: No lower extremity tenderness nor edema.   Neurologic:  Normal speech and language. No gross focal neurologic deficits are appreciated. Skin:  Skin is warm, dry and intact. No rash noted.   ____________________________________________   LABS (all labs ordered are listed, but only abnormal results are displayed)  Labs Reviewed  COMPREHENSIVE METABOLIC PANEL - Abnormal; Notable for the following components:      Result Value   Potassium 2.8 (*)    Calcium 8.8 (*)    All other components within normal limits  URINALYSIS, COMPLETE (UACMP) WITH MICROSCOPIC - Abnormal; Notable for the following components:   Color, Urine YELLOW (*)    APPearance CLEAR (*)    Hgb urine dipstick SMALL (*)     Ketones, ur 20 (*)    All other components within normal limits  SARS CORONAVIRUS 2 (HOSPITAL ORDER, [place] [facility])  URINE CULTURE  CULTURE, BLOOD (ROUTINE X 2)  CULTURE, BLOOD (ROUTINE X 2)  LIPASE, BLOOD  CBC WITH DIFFERENTIAL/PLATELET   ____________________________________________  EKG   ____________________________________________  RADIOLOGY  ED MD interpretation:  Official radiology report(s): Ct Renal Stone Study  Addendum Date: 06/10/2018   ADDENDUM REPORT: 06/10/2018 18:11 ADDENDUM: Upon further review, there is noted mild inflammatory changes around the colon suggesting mild inflammatory or infectious colitis. Also noted are several enlarged mesenteric lymph nodes  in the right lower quadrant which most likely are inflammatory in etiology. These results were called by telephone at the time of interpretation on 06/10/2018 at 6:11 pm to Dr. Conni Slipper , who verbally acknowledged these results. Electronically Signed   By: Marijo Conception M.D.   On: 06/10/2018 18:11   Result Date: 06/10/2018 CLINICAL DATA:  Acute right-sided abdominal pain. EXAM: CT ABDOMEN AND PELVIS WITHOUT CONTRAST TECHNIQUE: Multidetector CT imaging of the abdomen and pelvis was performed following the standard protocol without IV contrast. COMPARISON:  CT scan of February 14, 2016. FINDINGS: Lower chest: No acute abnormality. Hepatobiliary: No focal liver abnormality is seen. Status post cholecystectomy. No biliary dilatation. Pancreas: Unremarkable. No pancreatic ductal dilatation or surrounding inflammatory changes. Spleen: Normal in size without focal abnormality. Adrenals/Urinary Tract: Adrenal glands are unremarkable. Kidneys are normal, without renal calculi, focal lesion, or hydronephrosis. Bladder is unremarkable. Stomach/Bowel: Status post gastric bypass. There is no evidence of bowel obstruction or inflammation. Status post appendectomy. Vascular/Lymphatic: Aortic atherosclerosis.  No enlarged abdominal or pelvic lymph nodes. Reproductive: Status post hysterectomy. No adnexal masses. Other: No abdominal wall hernia or abnormality. No abdominopelvic ascites. Musculoskeletal: No acute or significant osseous findings. IMPRESSION: No acute abnormality seen in the abdomen or pelvis. Aortic Atherosclerosis (ICD10-I70.0). Electronically Signed: By: Marijo Conception M.D. On: 06/10/2018 16:31    ____________________________________________   PROCEDURES  Procedure(s) performed (including Critical Care):  Procedures   ____________________________________________   INITIAL IMPRESSION / ASSESSMENT AND PLAN / ED COURSE  Patient with colitis on CT scan a lot of pain and fever.  We will get her on some antibiotics and get her in the hospital for the time being until we can get her feeling better.  She really looks quite ill when I examined her.              ____________________________________________   FINAL CLINICAL IMPRESSION(S) / ED DIAGNOSES  Final diagnoses:  Right flank pain  Colitis  Fever, unspecified fever cause     ED Discharge Orders    None       Note:  This document was prepared using Dragon voice recognition software and may include unintentional dictation errors.    Nena Polio, MD 06/10/18 [phone]

## 2018-06-10 NOTE — ED Triage Notes (Signed)
Pt reports ha, chills, and generalized body aches

## 2018-06-10 NOTE — ED Notes (Signed)
Lab in with pt to draw second set of labs

## 2018-06-10 NOTE — ED Notes (Signed)
After 3 attempts only able to obtain 1 set of blood cultures - will have lab collect the 2nd set

## 2018-06-11 LAB — CBC
HCT: 30.5 % — ABNORMAL LOW (ref 36.0–46.0)
Hemoglobin: 10.5 g/dL — ABNORMAL LOW (ref 12.0–15.0)
MCH: 28.1 pg (ref 26.0–34.0)
MCHC: 34.4 g/dL (ref 30.0–36.0)
MCV: 81.6 fL (ref 80.0–100.0)
Platelets: 165 10*3/uL (ref 150–400)
RBC: 3.74 MIL/uL — ABNORMAL LOW (ref 3.87–5.11)
RDW: 13.3 % (ref 11.5–15.5)
WBC: 6.5 10*3/uL (ref 4.0–10.5)
nRBC: 0 % (ref 0.0–0.2)

## 2018-06-11 LAB — URINE CULTURE: Culture: NO GROWTH

## 2018-06-11 LAB — BASIC METABOLIC PANEL
Anion gap: 6 (ref 5–15)
BUN: 7 mg/dL (ref 6–20)
CO2: 26 mmol/L (ref 22–32)
Calcium: 8.1 mg/dL — ABNORMAL LOW (ref 8.9–10.3)
Chloride: 106 mmol/L (ref 98–111)
Creatinine, Ser: 0.76 mg/dL (ref 0.44–1.00)
GFR calc Af Amer: >60 mL/min (ref >60)
GFR calc non Af Amer: >60 mL/min (ref >60)
Glucose, Bld: 93 mg/dL (ref 70–99)
Potassium: 3.6 mmol/L (ref 3.5–5.1)
Sodium: 138 mmol/L (ref 135–145)

## 2018-06-11 LAB — MAGNESIUM: Magnesium: 1.9 mg/dL (ref 1.7–2.4)

## 2018-06-11 MED ORDER — ACETAMINOPHEN 325 MG PO TABS
650.0000 mg | ORAL_TABLET | Freq: Four times a day (QID) | ORAL | Status: DC | PRN
  Administered 2018-06-11 – 2018-06-14 (×4): 650 mg via ORAL
  Filled 2018-06-11 (×4): qty 2

## 2018-06-11 MED ORDER — POTASSIUM CHLORIDE 20 MEQ/15ML (10%) PO SOLN
40.0000 meq | Freq: Once | ORAL | Status: AC
  Administered 2018-06-11: 40 meq via ORAL
  Filled 2018-06-11: qty 30

## 2018-06-11 MED ORDER — TRAZODONE HCL 50 MG PO TABS
50.0000 mg | ORAL_TABLET | Freq: Every evening | ORAL | Status: DC | PRN
  Administered 2018-06-11 – 2018-06-13 (×3): 50 mg via ORAL
  Filled 2018-06-11 (×3): qty 1

## 2018-06-11 NOTE — Progress Notes (Signed)
[facility] [place] [facility]   PATIENT NAME: Vickie Turner    MR#:  [id number]  DATE OF BIRTH:  June 26, 1970  SUBJECTIVE:  CHIEF COMPLAINT: Patient is still having right lower quadrant abdominal pain but denies any nausea vomiting.  Having headache.  He denies any blood in her stool yesterday  REVIEW OF SYSTEMS:  CONSTITUTIONAL: No fever, fatigue or weakness.  EYES: No blurred or double vision.  EARS, NOSE, AND THROAT: No tinnitus or ear pain.  RESPIRATORY: No cough, shortness of breath, wheezing or hemoptysis.  CARDIOVASCULAR: No chest pain, orthopnea, edema.  GASTROINTESTINAL: No nausea, vomiting, reports right lower quadrant abdominal pain.  Denies any diarrhea GENITOURINARY: No dysuria, hematuria.  ENDOCRINE: No polyuria, nocturia,  HEMATOLOGY: No anemia, easy bruising or bleeding SKIN: No rash or lesion. MUSCULOSKELETAL: No joint pain or arthritis.   NEUROLOGIC: No tingling, numbness, weakness.  PSYCHIATRY: No anxiety or depression.   DRUG ALLERGIES:   Allergies  Allergen Reactions  . Sulfa Antibiotics Rash and Shortness Of Breath  . Other     Pork- religion contradiction    . Tape     Other reaction(s): Other (See Comments) PLASTIC MEDICAL TAPE, Unknown Kind of Tape    VITALS:  Blood pressure 98/63, pulse 77, temperature 98.8 F (37.1 C), temperature source Oral, resp. rate 20, height 5\' 8"  (1.727 m), weight 79.4 kg, SpO2 99 %.  PHYSICAL EXAMINATION:  GENERAL:  48 y.o.-year-old patient lying in the bed with no acute distress.  EYES: Pupils equal, round, reactive to light and accommodation. No scleral icterus. Extraocular muscles intact.  HEENT: Head atraumatic, normocephalic. Oropharynx and nasopharynx clear.  NECK:  Supple, no jugular venous distention. No thyroid enlargement, no tenderness.  LUNGS: Normal breath sounds bilaterally, no wheezing, rales,rhonchi or crepitation. No use of accessory muscles of respiration.  CARDIOVASCULAR:  S1, S2 normal. No murmurs, rubs, or gallops.  ABDOMEN: Soft, right lower quadrant is tender, no rebound tenderness nondistended. Bowel sounds present.  EXTREMITIES: No pedal edema, cyanosis, or clubbing.  NEUROLOGIC: Cranial nerves II through XII are intact. Muscle strength 5/5 in all extremities. Sensation intact. Gait not checked.  PSYCHIATRIC: The patient is alert and oriented x 3.  SKIN: No obvious rash, lesion, or ulcer.    LABORATORY PANEL:   CBC Recent Labs  Lab 06/11/18 0508  WBC 6.5  HGB 10.5*  HCT 30.5*  PLT 165   ------------------------------------------------------------------------------------------------------------------  Chemistries  Recent Labs  Lab 06/10/18 1535  06/11/18 0508  NA 138  --  138  K 2.8*   < > 3.6  CL 100  --  106  CO2 25  --  26  GLUCOSE 87  --  93  BUN 9  --  7  CREATININE 0.67  --  0.76  CALCIUM 8.8*  --  8.1*  MG  --    < > 1.9  AST 29  --   --   ALT 18  --   --   ALKPHOS 126  --   --   BILITOT 0.6  --   --    < > = values in this interval not displayed.   ------------------------------------------------------------------------------------------------------------------  Cardiac Enzymes No results for input(s): TROPONINI in the last 168 hours. ------------------------------------------------------------------------------------------------------------------  RADIOLOGY:  Ct Head Wo Contrast  Result Date: 06/10/2018 CLINICAL DATA:  Acute headache EXAM: CT HEAD WITHOUT CONTRAST TECHNIQUE: Contiguous axial images were obtained from the base of the skull through the vertex without intravenous contrast. COMPARISON:  None.  FINDINGS: Brain: There is no mass, hemorrhage or extra-axial collection. The size and configuration of the ventricles and extra-axial CSF spaces are normal. The brain parenchyma is normal, without acute or chronic infarction. Vascular: No abnormal hyperdensity of the major intracranial arteries or dural venous sinuses. No  intracranial atherosclerosis. Skull: The visualized skull base, calvarium and extracranial soft tissues are normal. Sinuses/Orbits: No fluid levels or advanced mucosal thickening of the visualized paranasal sinuses. No mastoid or middle ear effusion. The orbits are normal. IMPRESSION: Normal head CT Electronically Signed   By: Deatra RobinsonKevin  Herman M.D.   On: 06/10/2018 20:58   Ct Renal Stone Study  Addendum Date: 06/10/2018   ADDENDUM REPORT: 06/10/2018 18:11 ADDENDUM: Upon further review, there is noted mild inflammatory changes around the colon suggesting mild inflammatory or infectious colitis. Also noted are several enlarged mesenteric lymph nodes in the right lower quadrant which most likely are inflammatory in etiology. These results were called by telephone at the time of interpretation on 06/10/2018 at 6:11 pm to Dr. Dorothea GlassmanPAUL MALINDA , who verbally acknowledged these results. Electronically Signed   By: Lupita RaiderJames  Green Jr M.D.   On: 06/10/2018 18:11   Result Date: 06/10/2018 CLINICAL DATA:  Acute right-sided abdominal pain. EXAM: CT ABDOMEN AND PELVIS WITHOUT CONTRAST TECHNIQUE: Multidetector CT imaging of the abdomen and pelvis was performed following the standard protocol without IV contrast. COMPARISON:  CT scan of February 14, 2016. FINDINGS: Lower chest: No acute abnormality. Hepatobiliary: No focal liver abnormality is seen. Status post cholecystectomy. No biliary dilatation. Pancreas: Unremarkable. No pancreatic ductal dilatation or surrounding inflammatory changes. Spleen: Normal in size without focal abnormality. Adrenals/Urinary Tract: Adrenal glands are unremarkable. Kidneys are normal, without renal calculi, focal lesion, or hydronephrosis. Bladder is unremarkable. Stomach/Bowel: Status post gastric bypass. There is no evidence of bowel obstruction or inflammation. Status post appendectomy. Vascular/Lymphatic: Aortic atherosclerosis. No enlarged abdominal or pelvic lymph nodes. Reproductive: Status post  hysterectomy. No adnexal masses. Other: No abdominal wall hernia or abnormality. No abdominopelvic ascites. Musculoskeletal: No acute or significant osseous findings. IMPRESSION: No acute abnormality seen in the abdomen or pelvis. Aortic Atherosclerosis (ICD10-I70.0). Electronically Signed: By: Lupita RaiderJames  Green Jr M.D. On: 06/10/2018 16:31    EKG:   Orders placed or performed during the hospital encounter of 12/07/14  . EKG 12-Lead  . EKG 12-Lead  . EKG    ASSESSMENT AND PLAN:  Patient is a 48 year old female being admitted for treatment of colitis.  1.    Acute colitis with rt lower quadrant abdominal pain Clinically feeling better   Findings of colitis on CT scan. Continue on IV antibiotics with IV levofloxacin and Flagyl with plans to transition to p.o. antibiotics to complete total of 10-day treatment duration on discharge. IV fluid hydration.  PRN morphine for pain control.   Tolerating clear liquids will advance to full liquid diet Patient had colonoscopy done 10 years ago.  Suggested patient to follow-up with GI as an outpatient to get repeat colonoscopy.  She verbalized understanding   2.  Hypokalemia Repleted and repeat potassium 3.6 magnesium 1.9  3.  Severe headaches from sleep deprivation for the past few days from problem #1 No neck stiffness or any meningeal signs. CT head negative   DVT prophylaxis; Lovenox      All the records are reviewed and case discussed with Care Management/Social Workerr. Management plans discussed with the patient, husband over phone and they are in agreement.  CODE STATUS: Full code, husband is healthcare POA  TOTAL TIME  TAKING CARE OF THIS PATIENT: 36  minutes.   POSSIBLE D/C IN 1- 2 DAYS, DEPENDING ON CLINICAL CONDITION.  Note: This dictation was prepared with Dragon dictation along with smaller phrase technology. Any transcriptional errors that result from this process are unintentional.   Ramonita Lab M.D on 06/11/2018 at 1:01  PM  Between 7am to 6pm - Pager - [phone] After 6pm go to [email or web address] - password [email or web address]  [facility]  Office  [phone]  CC: Primary care physician; Patient, No Pcp Per

## 2018-06-11 NOTE — Progress Notes (Signed)
Family Meeting Note  Advance Directive:yes  Today a meeting took place with the Patient.   The following clinical team members were present during this meeting:MD  The following were discussed:Patient's diagnosis: Acute colitis with right  lower quadrant abdominal pain, headache, sleep deprivation, and treatment plan of care discussed in detail with the patient   patient's progosis: Unable to determine and Goals for treatment: Full Code  Husband is the healthcare.  Additional follow-up to be provided: Hospitalist  Time spent during discussion:17 min  Ramonita Lab, MD

## 2018-06-12 LAB — COMPREHENSIVE METABOLIC PANEL
ALT: 14 U/L (ref 0–44)
AST: 19 U/L (ref 15–41)
Albumin: 3 g/dL — ABNORMAL LOW (ref 3.5–5.0)
Alkaline Phosphatase: 84 U/L (ref 38–126)
Anion gap: 8 (ref 5–15)
BUN: 7 mg/dL (ref 6–20)
CO2: 25 mmol/L (ref 22–32)
Calcium: 8.5 mg/dL — ABNORMAL LOW (ref 8.9–10.3)
Chloride: 108 mmol/L (ref 98–111)
Creatinine, Ser: 0.64 mg/dL (ref 0.44–1.00)
GFR calc Af Amer: >60 mL/min (ref >60)
GFR calc non Af Amer: >60 mL/min (ref >60)
Glucose, Bld: 98 mg/dL (ref 70–99)
Potassium: 3.7 mmol/L (ref 3.5–5.1)
Sodium: 141 mmol/L (ref 135–145)
Total Bilirubin: 0.3 mg/dL (ref 0.3–1.2)
Total Protein: 5.4 g/dL — ABNORMAL LOW (ref 6.5–8.1)

## 2018-06-12 LAB — CBC
HCT: 29.9 % — ABNORMAL LOW (ref 36.0–46.0)
Hemoglobin: 10.1 g/dL — ABNORMAL LOW (ref 12.0–15.0)
MCH: 28.3 pg (ref 26.0–34.0)
MCHC: 33.8 g/dL (ref 30.0–36.0)
MCV: 83.8 fL (ref 80.0–100.0)
Platelets: 163 10*3/uL (ref 150–400)
RBC: 3.57 MIL/uL — ABNORMAL LOW (ref 3.87–5.11)
RDW: 13.3 % (ref 11.5–15.5)
WBC: 4.3 10*3/uL (ref 4.0–10.5)
nRBC: 0 % (ref 0.0–0.2)

## 2018-06-12 LAB — HIV ANTIBODY (ROUTINE TESTING W REFLEX): HIV Screen 4th Generation wRfx: NONREACTIVE

## 2018-06-12 MED ORDER — POLYETHYLENE GLYCOL 3350 17 G PO PACK
17.0000 g | PACK | Freq: Every day | ORAL | Status: DC | PRN
  Administered 2018-06-12 – 2018-06-13 (×2): 17 g via ORAL
  Filled 2018-06-12 (×2): qty 1

## 2018-06-12 MED ORDER — DIPHENHYDRAMINE HCL 25 MG PO CAPS
25.0000 mg | ORAL_CAPSULE | Freq: Four times a day (QID) | ORAL | Status: DC | PRN
  Administered 2018-06-12 (×2): 25 mg via ORAL
  Filled 2018-06-12 (×2): qty 1

## 2018-06-12 MED ORDER — DOCUSATE SODIUM 100 MG PO CAPS
100.0000 mg | ORAL_CAPSULE | Freq: Two times a day (BID) | ORAL | Status: DC
  Administered 2018-06-12 – 2018-06-14 (×4): 100 mg via ORAL
  Filled 2018-06-12 (×5): qty 1

## 2018-06-12 MED ORDER — TRAMADOL HCL 50 MG PO TABS
50.0000 mg | ORAL_TABLET | Freq: Four times a day (QID) | ORAL | Status: DC | PRN
  Administered 2018-06-13: 50 mg via ORAL
  Filled 2018-06-12: qty 1

## 2018-06-12 NOTE — Consult Note (Addendum)
GI Inpatient Consult Note  Reason for Consult: RLQ abd pain, Abnormal CT scan w/o contast   Attending Requesting Consult: Dr. Nicholes Mango, MD  History of Present Illness: Vickie Turner is a 48 y.o. female seen for evaluation of RLQ abd pain and abnormal CT scan at the request of Dr. Margaretmary Eddy. Pt presented to the ED 06/01 for two-day history of RLQ abd pain and subjective fever at home (up to 101.5). She was found to be hypokalemic (K 2.8), Covid negative, no leukocytosis, with CT scan w/o IV contrast showing mild inflammatory changes around the colon suggesting mild inflammatory or infectious colitis with several enlarged mesenteric lymph nodes in RLQ. She was empirically started on levofloxacin and Flagyl. She reports that her pain started to improve slightly yesterday, but this morning woke up and reports her pain is worse. Currently pain is 8/10 located in RLQ without radiation. She denies any current fever or chills. She denies any recent sick contacts or travel. Pain is exacerbated by any movement she makes. She has been on clear liquid diet but doesn't have an appetite now. She endorses chronic constipation for which she takes oral laxative tablets daily. If she didn't take these, she says she would not have a BM. She reports a prior cholecystectomy, appendectomy, and Roux-en-Y gastric bypass 8 years ago. She had lap pyeloplasty 04/2014 by Dr. Leona Singleton. Hysterectomy in 2007. She has followed with urology over the past several years for history of right ureteral stricture with hydronephrosis. She has dealt with some form of chronic RLQ abd pain over the past 4 years. She says this pain is much different and more intense. She denies any diarrhea, fecal urgency, or episodes of fecal incontinence. She denies any frequent NSAID use. There is no family hx of inflammatory bowel disease such as Crohn's disease, ulcerative colitis, or microscopic colitis. She reports her last BM was Sunday morning and was of  her normal stool caliber and color - #1 on BSFS. She denies any hematochezia or melena. She denies any nausea, vomiting, dysphagia, odynophagia, unintentional weight loss, epigastric abd pain, or early satiety. Last colonoscopy 10 years ago was reportedly negative. No known family hx of colon cancer.    Last Colonoscopy: 10 years ago  Last Endoscopy: 07/2017 by Dr. Alisia Ferrari 2/2 epigastric pain    Past Medical History:  Past Medical History:  Diagnosis Date  . BRCA negative 11/2016   MyRisk neg, except BRCA 1 VUS; IBIS=5.2%  . Chronic kidney disease   . Family history of ovarian cancer   . Urinary (tract) obstruction     Problem List: Patient Active Problem List   Diagnosis Date Noted  . Colitis 06/10/2018  . Family history of ovarian cancer 12/27/2016  . CIN I (cervical intraepithelial neoplasia I) 07/24/2016  . Iron deficiency anemia 08/09/2015  . Easy bruising 08/09/2015  . SOB (shortness of breath) 11/14/2013  . Palpitations 11/14/2013    Past Surgical History: Past Surgical History:  Procedure Laterality Date  . ABDOMINAL HYSTERECTOMY    . APPENDECTOMY    . BACK SURGERY    . CHOLECYSTECTOMY    . PERIPHERAL VASCULAR CATHETERIZATION  10/02/2014   Procedure: PICC Line Insertion;  Surgeon: Algernon Huxley, MD;  Location: [facility];  Service: Cardiovascular;;  . TONSILLECTOMY Bilateral     Allergies: Allergies  Allergen Reactions  . Sulfa Antibiotics Rash and Shortness Of Breath  . Other     Pork- religion contradiction    . Tape  Other reaction(s): Other (See Comments) PLASTIC MEDICAL TAPE, Unknown Kind of Tape    Home Medications: Medications Prior to Admission  Medication Sig Dispense Refill Last Dose  . estrogens, conjugated, (PREMARIN) 0.3 MG tablet Take 1 tablet (0.3 mg total) by mouth daily. Take daily for 21 days then do not take for 7 days. 90 tablet 3 Past Month at Unknown time   Home medication reconciliation was completed with the patient.    Scheduled Inpatient Medications:   . docusate sodium  100 mg Oral BID  . enoxaparin (LOVENOX) injection  40 mg Subcutaneous Q24H    Continuous Inpatient Infusions:   . sodium chloride 100 mL/hr at 06/12/18 0738  . levofloxacin (LEVAQUIN) IV 500 mg (06/11/18 2055)  . metronidazole 500 mg (06/12/18 1253)    PRN Inpatient Medications:  acetaminophen, diphenhydrAMINE, diphenhydrAMINE, morphine injection, ondansetron (ZOFRAN) IV, polyethylene glycol, traZODone  Family History: family history includes Cancer (age of onset: 51) in her sister; Heart Problems in her brother; Kidney cancer (age of onset: 56) in her maternal uncle; Ovarian cancer (age of onset: 101) in her sister; Prostate cancer (age of onset: 31) in her brother.  The patient's family history is negative for inflammatory bowel disorders, GI malignancy, or solid organ transplantation.  Social History:   reports that she has never smoked. She has never used smokeless tobacco. She reports that she does not drink alcohol or use drugs. The patient denies ETOH, tobacco, or drug use.   Review of Systems: Constitutional: Weight is stable.  Eyes: No changes in vision. ENT: No oral lesions, sore throat.  GI: see HPI.  Heme/Lymph: No easy bruising.  CV: No chest pain.  GU: No hematuria.  Integumentary: No rashes.  Neuro: No headaches.  Psych: No depression/anxiety.  Endocrine: No heat/cold intolerance.  Allergic/Immunologic: No urticaria.  Resp: No cough, SOB.  Musculoskeletal: No joint swelling.    Physical Examination: BP 111/71   Pulse 71   Temp 97.7 F (36.5 C) (Oral)   Resp 18   Ht 5' 8" (1.727 m)   Wt 79.4 kg   SpO2 99%   BMI 26.61 kg/m  Gen: NAD, alert and oriented x 4 HEENT: PEERLA, EOMI, Neck: supple, no JVD or thyromegaly Chest: CTA bilaterally, no wheezes, crackles, or other adventitious sounds CV: RRR, no m/g/c/r Abd: soft, ND, moderate TTP in RLQ, +BS in all four quadrants; no HSM, guarding, ridigity,  or rebound tenderness Ext: no edema, well perfused with 2+ pulses, Skin: no rash or lesions noted Lymph: no LAD  Data: Lab Results  Component Value Date   WBC 4.3 06/12/2018   HGB 10.1 (L) 06/12/2018   HCT 29.9 (L) 06/12/2018   MCV 83.8 06/12/2018   PLT 163 06/12/2018   Recent Labs  Lab 06/10/18 1535 06/11/18 0508 06/12/18 0504  HGB 12.9 10.5* 10.1*   Lab Results  Component Value Date   NA 141 06/12/2018   K 3.7 06/12/2018   CL 108 06/12/2018   CO2 25 06/12/2018   BUN 7 06/12/2018   CREATININE 0.64 06/12/2018   Lab Results  Component Value Date   ALT 14 06/12/2018   AST 19 06/12/2018   ALKPHOS 84 06/12/2018   BILITOT 0.3 06/12/2018   No results for input(s): APTT, INR, PTT in the last 168 hours. Assessment/Plan:  48 y/o female with a PMH of Roux-en-Y gastric bypass 2011, chronic constipation, recurrent UTI, CKD, family hx of ovarian cancer, multiple abdominal surgeries (appendectomy, cholecystectomy, hysterectomy) admitted 06/01 with 2-day history of  RLQ abd pain with fever and headache  1. RLQ abd pain 2. Abnormal CT scan w/o IV contrast 3. Fever - resolved  - CT renal scan w/o IV contrast showed mild inflammatory changes around the colon suggesting mild inflammatory or infectious colitis with several enlarged mesenteric lymph nodes in RLQ - Agree with current antibiotic regimen of Levofloxacin and Flagyl. Pt should complete at least a 10-day course.  - Differential includes infectious or inflammatory colitis, passed stone, pyelonephritis, other urologic etiology - Avoid NSAIDs. Continue current pain management plan. - Continue full liquid diet - No current indication for urgent colonoscopy - If patient is still having abd pain tomorrow even with antibiotics, I would repeat the CT scan abd/pelvis but perform with contrast to look closer for inflammatory changes in her colon - Pt will likely need a colonoscopy in outpatient setting and she may f/u with [place] in the  office - We will continue to follow along closely     Thank you for the consult. Please call with questions or concerns.  [facility] [phone] (Cell)

## 2018-06-12 NOTE — Progress Notes (Signed)
[facility] [place] [facility]   PATIENT NAME: Vickie Turner    MR#:  [id number]  DATE OF BIRTH:  06/27/1970  SUBJECTIVE:  CHIEF COMPLAINT: Patient is still having 7/10 right lower quadrant abdominal pain radiating to her back but denies any nausea vomiting.  She denies any blood in her stool yesterday. Doesn't want take too much narcotics   REVIEW OF SYSTEMS:  CONSTITUTIONAL: No fever, fatigue or weakness.  EYES: No blurred or double vision.  EARS, NOSE, AND THROAT: No tinnitus or ear pain.  RESPIRATORY: No cough, shortness of breath, wheezing or hemoptysis.  CARDIOVASCULAR: No chest pain, orthopnea, edema.  GASTROINTESTINAL: No nausea, vomiting, reports right lower quadrant abdominal pain.  Denies any diarrhea GENITOURINARY: No dysuria, hematuria.  ENDOCRINE: No polyuria, nocturia,  HEMATOLOGY: No anemia, easy bruising or bleeding SKIN: No rash or lesion. MUSCULOSKELETAL: No joint pain or arthritis.   NEUROLOGIC: No tingling, numbness, weakness.  PSYCHIATRY: No anxiety or depression.   DRUG ALLERGIES:   Allergies  Allergen Reactions  . Sulfa Antibiotics Rash and Shortness Of Breath  . Other     Pork- religion contradiction    . Tape     Other reaction(s): Other (See Comments) PLASTIC MEDICAL TAPE, Unknown Kind of Tape    VITALS:  Blood pressure (!) 108/59, pulse 63, temperature 98 F (36.7 C), temperature source Oral, resp. rate 18, height 5\' 8"  (1.727 m), weight 79.4 kg, SpO2 100 %.  PHYSICAL EXAMINATION:  GENERAL:  48 y.o.-year-old patient lying in the bed with no acute distress.  EYES: Pupils equal, round, reactive to light and accommodation. No scleral icterus. Extraocular muscles intact.  HEENT: Head atraumatic, normocephalic. Oropharynx and nasopharynx clear.  NECK:  Supple, no jugular venous distention. No thyroid enlargement, no tenderness.  LUNGS: Normal breath sounds bilaterally, no wheezing, rales,rhonchi or crepitation. No use of  accessory muscles of respiration.  CARDIOVASCULAR: S1, S2 normal. No murmurs, rubs, or gallops.  ABDOMEN: Soft, right lower quadrant is tender, no rebound tenderness nondistended. Bowel sounds present.  EXTREMITIES: No pedal edema, cyanosis, or clubbing.  NEUROLOGIC: Cranial nerves II through XII are intact. Muscle strength at her baseline  in all extremities. Sensation intact. Gait not checked.  PSYCHIATRIC: The patient is alert and oriented x 3.  SKIN: No obvious rash, lesion, or ulcer.    LABORATORY PANEL:   CBC Recent Labs  Lab 06/12/18 0504  WBC 4.3  HGB 10.1*  HCT 29.9*  PLT 163   ------------------------------------------------------------------------------------------------------------------  Chemistries  Recent Labs  Lab 06/11/18 0508 06/12/18 0504  NA 138 141  K 3.6 3.7  CL 106 108  CO2 26 25  GLUCOSE 93 98  BUN 7 7  CREATININE 0.76 0.64  CALCIUM 8.1* 8.5*  MG 1.9  --   AST  --  19  ALT  --  14  ALKPHOS  --  84  BILITOT  --  0.3   ------------------------------------------------------------------------------------------------------------------  Cardiac Enzymes No results for input(s): TROPONINI in the last 168 hours. ------------------------------------------------------------------------------------------------------------------  RADIOLOGY:  Ct Head Wo Contrast  Result Date: 06/10/2018 CLINICAL DATA:  Acute headache EXAM: CT HEAD WITHOUT CONTRAST TECHNIQUE: Contiguous axial images were obtained from the base of the skull through the vertex without intravenous contrast. COMPARISON:  None. FINDINGS: Brain: There is no mass, hemorrhage or extra-axial collection. The size and configuration of the ventricles and extra-axial CSF spaces are normal. The brain parenchyma is normal, without acute or chronic infarction. Vascular: No abnormal hyperdensity of the major  intracranial arteries or dural venous sinuses. No intracranial atherosclerosis. Skull: The visualized  skull base, calvarium and extracranial soft tissues are normal. Sinuses/Orbits: No fluid levels or advanced mucosal thickening of the visualized paranasal sinuses. No mastoid or middle ear effusion. The orbits are normal. IMPRESSION: Normal head CT Electronically Signed   By: Deatra Robinson M.D.   On: 06/10/2018 20:58    EKG:   Orders placed or performed during the hospital encounter of 12/07/14  . EKG 12-Lead  . EKG 12-Lead  . EKG    ASSESSMENT AND PLAN:  Patient is a 48 year old female being admitted for treatment of colitis.  1.    Acute  rt lower quadrant abdominal pain- prob acute colitis , DD acute viral with component of musculoskeletal Clinically not improving   Findings of colitis on CT scan. Continue on IV antibiotics with IV levofloxacin and Flagyl with plans to transition to p.o. antibiotics to complete total of 10-day treatment duration on discharge. IV fluid hydration.  PRN morphine for pain control.    Tramadol is added for the moderate pain control Tolerating  full liquid diet GI consult placed now recommending to repeat CT scan of the abdomen with contrast if no clinical improvement in the next 24 hours To rule out viral etiology GI panel and norovirus panel ordered Patient had colonoscopy done 10 years ago.  Suggested patient to follow-up with GI as an outpatient to get repeat colonoscopy.  She verbalized understanding   2.  Hypokalemia Repleted and repeat potassium 3.6 magnesium 1.9  3.  Severe headaches from sleep deprivation for the past few days from problem #1 No neck stiffness or any meningeal signs. CT head negative  4.  Constipation stool softener Colace and MiraLAX added to the regimen   DVT prophylaxis; Lovenox      All the records are reviewed and case discussed with Care Management/Social Workerr. Management plans discussed with the patient  and she is in agreement.  CODE STATUS: Full code, husband is healthcare POA  TOTAL TIME TAKING CARE  OF THIS PATIENT: 36  minutes.   POSSIBLE D/C IN 1- 2 DAYS, DEPENDING ON CLINICAL CONDITION.  Note: This dictation was prepared with Dragon dictation along with smaller phrase technology. Any transcriptional errors that result from this process are unintentional.   Ramonita Lab M.D on 06/12/2018 at 8:09 PM  Between 7am to 6pm - Pager - [phone] After 6pm go to [email or web address] - password [email or web address]  [facility]  Office  [phone]  CC: Primary care physician; Patient, No Pcp Per

## 2018-06-13 ENCOUNTER — Encounter: Payer: Self-pay | Admitting: Radiology

## 2018-06-13 ENCOUNTER — Inpatient Hospital Stay: Payer: PRIVATE HEALTH INSURANCE

## 2018-06-13 LAB — CBC WITH DIFFERENTIAL/PLATELET
Abs Immature Granulocytes: 0.01 10*3/uL (ref 0.00–0.07)
Basophils Absolute: 0 10*3/uL (ref 0.0–0.1)
Basophils Relative: 0 %
Eosinophils Absolute: 0.1 10*3/uL (ref 0.0–0.5)
Eosinophils Relative: 2 %
HCT: 29 % — ABNORMAL LOW (ref 36.0–46.0)
Hemoglobin: 9.6 g/dL — ABNORMAL LOW (ref 12.0–15.0)
Immature Granulocytes: 0 %
Lymphocytes Relative: 58 %
Lymphs Abs: 2.8 10*3/uL (ref 0.7–4.0)
MCH: 27.7 pg (ref 26.0–34.0)
MCHC: 33.1 g/dL (ref 30.0–36.0)
MCV: 83.6 fL (ref 80.0–100.0)
Monocytes Absolute: 0.2 10*3/uL (ref 0.1–1.0)
Monocytes Relative: 4 %
Neutro Abs: 1.7 10*3/uL (ref 1.7–7.7)
Neutrophils Relative %: 36 %
Platelets: 171 10*3/uL (ref 150–400)
RBC: 3.47 MIL/uL — ABNORMAL LOW (ref 3.87–5.11)
RDW: 13.3 % (ref 11.5–15.5)
Smear Review: NORMAL
WBC: 4.8 10*3/uL (ref 4.0–10.5)
nRBC: 0 % (ref 0.0–0.2)

## 2018-06-13 LAB — COMPREHENSIVE METABOLIC PANEL
ALT: 17 U/L (ref 0–44)
AST: 30 U/L (ref 15–41)
Albumin: 2.8 g/dL — ABNORMAL LOW (ref 3.5–5.0)
Alkaline Phosphatase: 93 U/L (ref 38–126)
Anion gap: 6 (ref 5–15)
BUN: 8 mg/dL (ref 6–20)
CO2: 25 mmol/L (ref 22–32)
Calcium: 8.1 mg/dL — ABNORMAL LOW (ref 8.9–10.3)
Chloride: 110 mmol/L (ref 98–111)
Creatinine, Ser: 0.5 mg/dL (ref 0.44–1.00)
GFR calc Af Amer: >60 mL/min (ref >60)
GFR calc non Af Amer: >60 mL/min (ref >60)
Glucose, Bld: 94 mg/dL (ref 70–99)
Potassium: 3.5 mmol/L (ref 3.5–5.1)
Sodium: 141 mmol/L (ref 135–145)
Total Bilirubin: 0.2 mg/dL — ABNORMAL LOW (ref 0.3–1.2)
Total Protein: 5 g/dL — ABNORMAL LOW (ref 6.5–8.1)

## 2018-06-13 MED ORDER — IOHEXOL 240 MG/ML SOLN
25.0000 mL | INTRAMUSCULAR | Status: AC
  Administered 2018-06-13 (×2): 25 mL via ORAL

## 2018-06-13 MED ORDER — IOHEXOL 300 MG/ML  SOLN
100.0000 mL | Freq: Once | INTRAMUSCULAR | Status: AC | PRN
  Administered 2018-06-13: 100 mL via INTRAVENOUS

## 2018-06-13 MED ORDER — MORPHINE SULFATE (PF) 2 MG/ML IV SOLN
2.0000 mg | Freq: Four times a day (QID) | INTRAVENOUS | Status: DC | PRN
  Administered 2018-06-13: 2 mg via INTRAVENOUS
  Filled 2018-06-13: qty 1

## 2018-06-13 MED ORDER — SORBITOL 70 % SOLN
960.0000 mL | TOPICAL_OIL | Freq: Once | ORAL | Status: AC
  Administered 2018-06-13: 960 mL via RECTAL
  Filled 2018-06-13 (×2): qty 473

## 2018-06-13 MED ORDER — IOHEXOL 240 MG/ML SOLN: 25.0000 mL | INTRAMUSCULAR | Status: DC

## 2018-06-13 NOTE — Progress Notes (Signed)
GI Inpatient Follow-up Note  Subjective:  Patient seen in f/u for right flank and right lower abd pain with questionable colitis. She denies any acute events overnight. No new complaints. She reports pain is slightly improved from yesterday and reports pain level is currently 5/10 in severity. She reports some "squeezing" sensations in her lower abd regions that has been episodic in nature and feels "like a pulled muscle." She has not had a BM since her admission. She reports the nursing staff is supposed to administer an enema this afternoon in hopes to produce a BM. She does have chronic constipation where she normally takes 2-3 oral laxative pills daily. She denies any nausea, vomiting, dysphagia, odynophagia, rectal bleeding, or rectal pain. She is passing flatus. She denies any abdominal swelling or lower extremity edema. She is glad that she has been able to facetime with her husband and children. She has been tolerating a full liquid diet w/o any abnormalities.   Scheduled Inpatient Medications:  . docusate sodium  100 mg Oral BID  . enoxaparin (LOVENOX) injection  40 mg Subcutaneous Q24H  . sorbitol, milk of mag, mineral oil, glycerin (SMOG) enema  960 mL Rectal Once    Continuous Inpatient Infusions:   . sodium chloride 75 mL/hr at 06/13/18 1200  . levofloxacin (LEVAQUIN) IV Stopped (06/12/18 2253)  . metronidazole 500 mg (06/13/18 1200)    PRN Inpatient Medications:  acetaminophen, diphenhydrAMINE, diphenhydrAMINE, morphine injection, ondansetron (ZOFRAN) IV, polyethylene glycol, traMADol, traZODone  Review of Systems: Constitutional: Weight is stable.  Eyes: No changes in vision. ENT: No oral lesions, sore throat.  GI: see HPI.  Heme/Lymph: No easy bruising.  CV: No chest pain.  GU: No hematuria.  Integumentary: No rashes.  Neuro: No headaches.  Psych: No depression/anxiety.  Endocrine: No heat/cold intolerance.  Allergic/Immunologic: No urticaria.  Resp: No cough, SOB.   Musculoskeletal: No joint swelling.    Physical Examination: BP 99/67 (BP Location: Left Arm)   Pulse 73   Temp 98 F (36.7 C) (Oral)   Resp 16   Ht 5\' 8"  (1.727 m)   Wt 79.4 kg   SpO2 98%   BMI 26.61 kg/m  Gen: NAD, alert and oriented x 4 HEENT: PEERLA, EOMI, Neck: supple, no JVD or thyromegaly Chest: CTA bilaterally, no wheezes, crackles, or other adventitious sounds CV: RRR, no m/g/c/r Abd: soft, nondistended, mildly tender in RLQ, +BS in all four quadrants; no HSM, guarding, ridigity, or rebound tenderness Ext: no edema, well perfused with 2+ pulses, Skin: no rash or lesions noted Lymph: no LAD  Data: Lab Results  Component Value Date   WBC 4.8 06/13/2018   HGB 9.6 (L) 06/13/2018   HCT 29.0 (L) 06/13/2018   MCV 83.6 06/13/2018   PLT 171 06/13/2018   Recent Labs  Lab 06/11/18 0508 06/12/18 0504 06/13/18 0400  HGB 10.5* 10.1* 9.6*   Lab Results  Component Value Date   NA 141 06/13/2018   K 3.5 06/13/2018   CL 110 06/13/2018   CO2 25 06/13/2018   BUN 8 06/13/2018   CREATININE 0.50 06/13/2018   Lab Results  Component Value Date   ALT 17 06/13/2018   AST 30 06/13/2018   ALKPHOS 93 06/13/2018   BILITOT 0.2 (L) 06/13/2018   No results for input(s): APTT, INR, PTT in the last 168 hours. Assessment/Plan:  48 y/o female with a PMH of Roux-en-Y gastric bypass 2011, chronic constipation, recurrent UTI, CKD, family hx of ovarian cancer, multiple abdominal surgeries (appendectomy, cholecystectomy, hysterectomy)  admitted 06/01 with 2-day history of RLQ abd pain with fever and headache  1. RLQ abd pain 2. Abnormal CT scan w/o IV contrast 3. Fever - resolved. WBC count normal.  - I advise CT scan abd/pelvis with contrast to assess for inflammatory changes or any colonic wall inflammation - Agree with enema this afternoon - Constipation is currently sub-optimally controlled with Colace and Miralax. Pt might benefit from milk of magnesia while in the hospital  with consideration or Linzess or Amitiza as outpatient - Low suspicion for infectious etiology given she is not having any diarrhea symptoms. However, it would be useful to rule out bacterial or viral etiology so agree with GI PCR and norovirus panel - Continue symptomatic management - Continue IV antibiotics for now but anticipate transition to oral antibiotics on discharge - Further recs after CT scan - She will need GI f/u as outpatient for screening colonoscopy    Please call with questions or concerns.    [name], PA-C [facility] [phone] (Cell)

## 2018-06-13 NOTE — Progress Notes (Signed)
[facility] - [place] at [facility]   PATIENT NAME: Vickie Turner    MR#:  [id number]  DATE OF BIRTH:  03/08/1970  SUBJECTIVE:  CHIEF COMPLAINT: Patient is out of bed to chair.  Abdominal pain is slightly better today at 5-6 out of 10 ,denies any nausea vomiting.  Constipated for the past 5 days   REVIEW OF SYSTEMS:  CONSTITUTIONAL: No fever, fatigue or weakness.  EYES: No blurred or double vision.  EARS, NOSE, AND THROAT: No tinnitus or ear pain.  RESPIRATORY: No cough, shortness of breath, wheezing or hemoptysis.  CARDIOVASCULAR: No chest pain, orthopnea, edema.  GASTROINTESTINAL: No nausea, vomiting, reports right lower quadrant abdominal pain.  Denies any diarrhea GENITOURINARY: No dysuria, hematuria.  ENDOCRINE: No polyuria, nocturia,  HEMATOLOGY: No anemia, easy bruising or bleeding SKIN: No rash or lesion. MUSCULOSKELETAL: No joint pain or arthritis.   NEUROLOGIC: No tingling, numbness, weakness.  PSYCHIATRY: No anxiety or depression.   DRUG ALLERGIES:   Allergies  Allergen Reactions  . Sulfa Antibiotics Rash and Shortness Of Breath  . Other     Pork- religion contradiction    . Tape     Other reaction(s): Other (See Comments) PLASTIC MEDICAL TAPE, Unknown Kind of Tape    VITALS:  Blood pressure 99/67, pulse 73, temperature 98 F (36.7 C), temperature source Oral, resp. rate 16, height 5\' 8"  (1.727 m), weight 79.4 kg, SpO2 98 %.  PHYSICAL EXAMINATION:  GENERAL:  48 y.o.-year-old patient lying in the bed with no acute distress.  EYES: Pupils equal, round, reactive to light and accommodation. No scleral icterus. Extraocular muscles intact.  HEENT: Head atraumatic, normocephalic. Oropharynx and nasopharynx clear.  NECK:  Supple, no jugular venous distention. No thyroid enlargement, no tenderness.  LUNGS: Normal breath sounds bilaterally, no wheezing, rales,rhonchi or crepitation. No use of accessory muscles of respiration.  CARDIOVASCULAR: S1,  S2 normal. No murmurs, rubs, or gallops.  ABDOMEN: Soft, right lower quadrant is tender, no rebound tenderness nondistended. Bowel sounds present.  EXTREMITIES: No pedal edema, cyanosis, or clubbing.  NEUROLOGIC: Cranial nerves II through XII are intact. Muscle strength at her baseline  in all extremities. Sensation intact. Gait not checked.  PSYCHIATRIC: The patient is alert and oriented x 3.  SKIN: No obvious rash, lesion, or ulcer.    LABORATORY PANEL:   CBC Recent Labs  Lab 06/13/18 0400  WBC 4.8  HGB 9.6*  HCT 29.0*  PLT 171   ------------------------------------------------------------------------------------------------------------------  Chemistries  Recent Labs  Lab 06/11/18 0508  06/13/18 0400  NA 138   < > 141  K 3.6   < > 3.5  CL 106   < > 110  CO2 26   < > 25  GLUCOSE 93   < > 94  BUN 7   < > 8  CREATININE 0.76   < > 0.50  CALCIUM 8.1*   < > 8.1*  MG 1.9  --   --   AST  --    < > 30  ALT  --    < > 17  ALKPHOS  --    < > 93  BILITOT  --    < > 0.2*   < > = values in this interval not displayed.   ------------------------------------------------------------------------------------------------------------------  Cardiac Enzymes No results for input(s): TROPONINI in the last 168 hours. ------------------------------------------------------------------------------------------------------------------  RADIOLOGY:  No results found.  EKG:   Orders placed or performed during the hospital encounter of 12/07/14  . EKG 12-Lead  .  EKG 12-Lead  . EKG    ASSESSMENT AND PLAN:  Patient is a 48 year old female being admitted for treatment of colitis.  1.    Acute  rt lower quadrant abdominal pain- prob acute colitis , DD acute viral with component of musculoskeletal Clinically not improving   Findings of colitis on CT scan. Continue on IV antibiotics with IV levofloxacin and Flagyl with plans to transition to p.o. antibiotics to complete total of 10-day  treatment duration on discharge. IV fluid hydration.  PRN morphine for pain control.    Tramadol is added for the moderate pain control Tolerating  full liquid diet, vascular soft diet as tolerated GI consult placed ,recommending to repeat CT scan of the abdomen with contrast  To rule out viral etiology GI panel and norovirus panel ordered but patient is constipated for the past 5 days and could not give any sample Patient had colonoscopy done 10 years ago.  Suggested patient to follow-up with GI as an outpatient to get repeat colonoscopy.  She verbalized understanding   2.  Hypokalemia Repleted and repeat potassium 3.6 magnesium 1.9  3.  Severe headaches from sleep deprivation for the past few days from problem #1 No neck stiffness or any meningeal signs. CT head negative  4.  Severe constipation with history of chronic constipation currently on narcotics  stool softener Colace and MiraLAX added to the regimen Enema x1 as patient has no bowel movement for the past 5 days   DVT prophylaxis; Lovenox      All the records are reviewed and case discussed with Care Management/Social Workerr. Management plans discussed with the patient  and she is in agreement.  CODE STATUS: Full code, husband is healthcare POA  TOTAL TIME TAKING CARE OF THIS PATIENT: 36  minutes.   POSSIBLE D/C IN 1- 2 DAYS, DEPENDING ON CLINICAL CONDITION.  Note: This dictation was prepared with Dragon dictation along with smaller phrase technology. Any transcriptional errors that result from this process are unintentional.   Ramonita LabAruna Byrne Capek M.D on 06/13/2018 at 4:34 PM  Between 7am to 6pm - Pager - [phone] After 6pm go to [email or web address] - password [email or web address]  [facility]  Office  [phone]  CC: Primary care physician; Patient, No Pcp Per

## 2018-06-14 LAB — LIPASE, BLOOD: Lipase: 35 U/L (ref 11–51)

## 2018-06-14 MED ORDER — PANTOPRAZOLE SODIUM 40 MG PO TBEC
40.0000 mg | DELAYED_RELEASE_TABLET | Freq: Two times a day (BID) | ORAL | Status: DC
  Administered 2018-06-14: 40 mg via ORAL
  Filled 2018-06-14: qty 1

## 2018-06-14 MED ORDER — PANTOPRAZOLE SODIUM 40 MG PO TBEC: 40.0000 mg | DELAYED_RELEASE_TABLET | Freq: Two times a day (BID) | ORAL | 0 refills | Status: AC

## 2018-06-14 MED ORDER — ACETAMINOPHEN 325 MG PO TABS
650.0000 mg | ORAL_TABLET | Freq: Four times a day (QID) | ORAL | Status: DC | PRN
  Administered 2018-06-14: 650 mg via ORAL
  Filled 2018-06-14: qty 2

## 2018-06-14 MED ORDER — POLYETHYLENE GLYCOL 3350 17 G PO PACK: 17.0000 g | PACK | Freq: Every day | ORAL | 0 refills | Status: DC | PRN

## 2018-06-14 MED ORDER — DOCUSATE SODIUM 100 MG PO CAPS: 100.0000 mg | ORAL_CAPSULE | Freq: Two times a day (BID) | ORAL | 0 refills | Status: DC

## 2018-06-14 MED ORDER — ACETAMINOPHEN 325 MG PO TABS: 650.0000 mg | ORAL_TABLET | Freq: Four times a day (QID) | ORAL | Status: AC | PRN

## 2018-06-14 MED ORDER — FLEET ENEMA 7-19 GM/118ML RE ENEM
1.0000 | ENEMA | Freq: Once | RECTAL | Status: AC
  Administered 2018-06-14: 1 via RECTAL

## 2018-06-14 MED ORDER — TRAMADOL HCL 50 MG PO TABS: 50.0000 mg | ORAL_TABLET | Freq: Four times a day (QID) | ORAL | 0 refills | Status: DC | PRN

## 2018-06-14 NOTE — Progress Notes (Signed)
GI Inpatient Follow-up Note  Subjective: Abdominal pain, right-sided  Patient seen in f/u for abdominal pain and questionable colitis. She had CT abd/pelvis with contrast yesterday afternoon which showed no evidence of inflammatory disease of the right colon, no acute colon findings. However, there was newly seen edema in portal region and RUQ. She reports no acute events overnight. She denies having a good night sleep and reports 4/10 right-sided abdominal pain that she describes as sharp in nature. No worse than yesterday but no better. She had enema yesterday but reports she was unable to hold it in long enough. Very small volume BM this morning. She does endorse some dry heaving this morning and spitting up saliva. There has been no acid regurgitation or retrosternal burning. She denies dysphagia or odynophagia. She reports she is ready to go home today so she can sleep in her bed. She denies any rectal bleeding, rectal pain, or burning.   Scheduled Inpatient Medications:  . docusate sodium  100 mg Oral BID  . enoxaparin (LOVENOX) injection  40 mg Subcutaneous Q24H  . pantoprazole  40 mg Oral BID    Continuous Inpatient Infusions:   . levofloxacin (LEVAQUIN) IV Stopped (06/13/18 2242)  . metronidazole Stopped (06/14/18 0423)    PRN Inpatient Medications:  acetaminophen, diphenhydrAMINE, diphenhydrAMINE, ondansetron (ZOFRAN) IV, polyethylene glycol, traMADol, traZODone  Review of Systems: Constitutional: Weight is stable.  Eyes: No changes in vision. ENT: No oral lesions, sore throat.  GI: see HPI.  Heme/Lymph: No easy bruising.  CV: No chest pain.  GU: No hematuria.  Integumentary: No rashes.  Neuro: No headaches.  Psych: No depression/anxiety.  Endocrine: No heat/cold intolerance.  Allergic/Immunologic: No urticaria.  Resp: No cough, SOB.  Musculoskeletal: No joint swelling.    Physical Examination: BP 126/74 (BP Location: Left Arm)   Pulse 74   Temp 98.2 F (36.8 C)  (Oral)   Resp 17   Ht 5\' 8"  (1.727 m)   Wt 79.4 kg   SpO2 99%   BMI 26.61 kg/m  Gen: NAD, alert and oriented x 4 HEENT: PEERLA, EOMI, Neck: supple, no JVD or thyromegaly Chest: CTA bilaterally, no wheezes, crackles, or other adventitious sounds CV: RRR, no m/g/c/r Abd: soft, mildly tender to deep palpation in RUQ and RLQ, ND, +BS in all four quadrants; no HSM, guarding, ridigity, or rebound tenderness Ext: no edema, well perfused with 2+ pulses, Skin: no rash or lesions noted Lymph: no LAD  Data: Lab Results  Component Value Date   WBC 4.8 06/13/2018   HGB 9.6 (L) 06/13/2018   HCT 29.0 (L) 06/13/2018   MCV 83.6 06/13/2018   PLT 171 06/13/2018   Recent Labs  Lab 06/11/18 0508 06/12/18 0504 06/13/18 0400  HGB 10.5* 10.1* 9.6*   Lab Results  Component Value Date   NA 141 06/13/2018   K 3.5 06/13/2018   CL 110 06/13/2018   CO2 25 06/13/2018   BUN 8 06/13/2018   CREATININE 0.50 06/13/2018   Lab Results  Component Value Date   ALT 17 06/13/2018   AST 30 06/13/2018   ALKPHOS 93 06/13/2018   BILITOT 0.2 (L) 06/13/2018   No results for input(s): APTT, INR, PTT in the last 168 hours. Assessment/Plan:  48 y/o female with a PMH of Roux-en-Y gastric bypass 2011, chronic constipation, recurrent UTI, CKD, family hx of ovarian cancer, multiple abdominal surgeries (appendectomy, cholecystectomy, hysterectomy) admitted 06/01 with 2-day history of RLQ abd pain with fever and headache  1. RLQ abd pain 2.  Fever - resolved. WBC count normal.  - CT scan abd/pelvis with contrast showing no evidence of inflammatory changes in the colon. However, there was newly seen edema in portal region and RUQ, suggestive of peptic or duodenal source - No clinical signs of pancreatitis. Lipase is pending at time of note. - Advise patient start Protonix 40 mg BID indefinitely - she was advised to take 20 min before breakfast and dinner on an empty stomach - We will check H pylori stool ag or H  pylori breath test - Enema x1 today in hopes to produce good BM. She should resume her home regimen of laxatives - Anticipate discharge today if successful BM. She needs to be moving her bowels before discharge. - Pt should f/u as outpatient with myself or Dr. Norma Fredricksonoledo in 2 weeks - She will need screening colonoscopy in near future    Please call with questions or concerns.    Jacob MooresMason Maizie Garno, PA-C [facility] [phone] (Cell)

## 2018-06-14 NOTE — Discharge Instructions (Signed)
Follow-up with primary care physician in 2 to 3 days Follow-up with gastroenterology Dr. Norma Fredrickson in a week

## 2018-06-14 NOTE — Discharge Summary (Signed)
Vickie Turner    MR#:  [id number]  DATE OF BIRTH:  November 01, 1970  DATE OF ADMISSION:  06/10/2018 ADMITTING PHYSICIAN: Jude Ojie, MD  DATE OF DISCHARGE:  06/14/18   PRIMARY CARE PHYSICIAN: Patient, No Pcp Per    ADMISSION DIAGNOSIS:  Colitis [K52.9] Right flank pain [R10.9] Fever, unspecified fever cause [R50.9]  DISCHARGE DIAGNOSIS:  Acute colitis ruled out-repeat imaging study Peptic ulcer disease  SECONDARY DIAGNOSIS:   Past Medical History:  Diagnosis Date  . BRCA negative 11/2016   MyRisk neg, except BRCA 1 VUS; IBIS=5.2%  . Chronic kidney disease   . Family history of ovarian cancer   . Urinary (tract) obstruction     HOSPITAL COURSE:  HPI  Vickie Turner  is a 48 y.o. female with a known history of ovarian cancer who presented to the emergency room with complaints of abdominal pains.  Mostly in the right lower quadrant.  Reported having some fevers at home.  Temperature one 1.5 yesterday for which patient took Tylenol.  Symptoms have been going on for 2 days.  Patient also complains of headaches.  No neck stiffness.  No confusion.  No nausea vomiting or diarrhea.  Patient was evaluated in the emergency room and found to have hypokalemia with potassium of 2.8 which is being replaced.  No leukocytosis.  COVID test negative.  CT scan; renal stone study revealed findings concerning for colitis.  Patient started empirically on IV levofloxacin and Flagyl.  Medical service called to admit patient for further evaluation and management.  1.  Acute  rt lower quadrant abdominal pain-  , acute on chronic constipation with peptic ulcer disease Clinically not improving  Findings of colitis on CT scan initially.  Repeat CT abdomen with contrast has revealed no inflammation in the right side of the colon.  Results reviewed by GI Patient treated with IV antibiotics levofloxacin and Flagyl which were discontinued as acute  colitis ruled out after discussing with gastroenterology.  Recommending PPI for peptic ulcer disease and outpatient follow-up  IV fluids and PRN pain medications provided IV fluid hydration. PRN morphine for pain control.   Tramadol is added for the moderate pain control Tolerating  full liquid diet, vascular soft diet as tolerated GI consult placed ,recommending to repeat CT scan of the abdomen with contrast  To rule out viral etiology GI panel and norovirus panel ordered but patient is constipated for the past 5 days and could not give any sample Patient had colonoscopy done 10 years ago.  Suggested patient to follow-up with GI as an outpatient to get repeat colonoscopy.  She verbalized understanding   2.Hypokalemia Repleted and repeat potassium 3.6 magnesium 1.9  3.Severe headaches from sleep deprivation for the past few days from problem #1 No neck stiffness or any meningeal signs. CT head negative  4.  Severe constipation with history of chronic constipation currently on narcotics  stool softener Colace and MiraLAX added to the regimen Enema x1 as patient has no bowel movement for the past 5 days.  Patient had a bowel movement.  5.  Peptic ulcer disease -PPI twice a day and outpatient follow-up with gastroenterology Dr. Alice Reichert in a week.  H. pylori breath test ordered  Discharge patient home  DISCHARGE CONDITIONS:  Stable   CONSULTS OBTAINED:  Treatment Team:  Efrain Sella, MD   PROCEDURES  None   DRUG ALLERGIES:   Allergies  Allergen Reactions  . Sulfa  Antibiotics Rash and Shortness Of Breath  . Other     Pork- religion contradiction    . Tape     Other reaction(s): Other (See Comments) PLASTIC MEDICAL TAPE, Unknown Kind of Tape    DISCHARGE MEDICATIONS:   Allergies as of 06/14/2018      Reactions   Sulfa Antibiotics Rash, Shortness Of Breath   Other    Pork- religion contradiction     Tape    Other reaction(s): Other (See Comments) PLASTIC  MEDICAL TAPE, Unknown Kind of Tape      Medication List    TAKE these medications   acetaminophen 325 MG tablet Commonly known as:  TYLENOL Take 2 tablets (650 mg total) by mouth every 6 (Turner) hours as needed for mild pain, moderate pain, fever or headache (headache).   docusate sodium 100 MG capsule Commonly known as:  COLACE Take 1 capsule (100 mg total) by mouth 2 (two) times daily.   estrogens (conjugated) 0.3 MG tablet Commonly known as:  Premarin Take 1 tablet (0.3 mg total) by mouth daily. Take daily for 21 days then do not take for 7 days.   pantoprazole 40 MG tablet Commonly known as:  PROTONIX Take 1 tablet (40 mg total) by mouth 2 (two) times daily.   polyethylene glycol 17 g packet Commonly known as:  MIRALAX / GLYCOLAX Take 17 g by mouth daily as needed for moderate constipation.   traMADol 50 MG tablet Commonly known as:  ULTRAM Take 1 tablet (50 mg total) by mouth every 6 (Turner) hours as needed for moderate pain.        DISCHARGE INSTRUCTIONS:   Follow-up with primary care physician in 2 to 3 days Follow-up with gastroenterology Dr. Alice Reichert in a week  DIET:  Regular diet  DISCHARGE CONDITION:  Fair  ACTIVITY:  Activity as tolerated  OXYGEN:  Home Oxygen: No.   Oxygen Delivery: room air  DISCHARGE LOCATION:  home   If you experience worsening of your admission symptoms, develop shortness of breath, life threatening emergency, suicidal or homicidal thoughts you must seek medical attention immediately by calling 911 or calling your MD immediately  if symptoms less severe.  You Must read complete instructions/literature along with all the possible adverse reactions/side effects for all the Medicines you take and that have been prescribed to you. Take any new Medicines after you have completely understood and accpet all the possible adverse reactions/side effects.   Please note  You were cared for by a hospitalist during your hospital stay. If you  have any questions about your discharge medications or the care you received while you were in the hospital after you are discharged, you can call the unit and asked to speak with the hospitalist on call if the hospitalist that took care of you is not available. Once you are discharged, your primary care physician will handle any further medical issues. Please note that NO REFILLS for any discharge medications will be authorized once you are discharged, as it is imperative that you return to your primary care physician (or establish a relationship with a primary care physician if you do not have one) for your aftercare needs so that they can reassess your need for medications and monitor your lab values.     Today  Chief Complaint  Patient presents with  . Flank Pain   Patient is feeling better after bowel movement abdominal pain is improving.  Tolerating diet and okay to discharge patient from GI standpoint.  Patient wants to go home as she did not like hospital food and could not sleep here  ROS:  CONSTITUTIONAL: Denies fevers, chills. Denies any fatigue, weakness.  EYES: Denies blurry vision, double vision, eye pain. EARS, NOSE, THROAT: Denies tinnitus, ear pain, hearing loss. RESPIRATORY: Denies cough, wheeze, shortness of breath.  CARDIOVASCULAR: Denies chest pain, palpitations, edema.  GASTROINTESTINAL: Denies nausea, vomiting, diarrhea, abdominal pain. Denies bright red blood per rectum. GENITOURINARY: Denies dysuria, hematuria. ENDOCRINE: Denies nocturia or thyroid problems. HEMATOLOGIC AND LYMPHATIC: Denies easy bruising or bleeding. SKIN: Denies rash or lesion. MUSCULOSKELETAL: Denies pain in neck, back, shoulder, knees, hips or arthritic symptoms.  NEUROLOGIC: Denies paralysis, paresthesias.  PSYCHIATRIC: Denies anxiety or depressive symptoms.   VITAL SIGNS:  Blood pressure 126/74, pulse 74, temperature 98.2 F (36.8 C), temperature source Oral, resp. rate 17, height _0   (1.727 m), weight 79.4 kg, SpO2 99 %.  I/O:    Intake/Output Summary (Last 24 hours) at 06/14/2018 1528 Last data filed at 06/14/2018 1419 Gross per 24 hour  Intake 1511.97 ml  Output 2 ml  Net 1509.97 ml    PHYSICAL EXAMINATION:  GENERAL:  48 y.o.-year-old patient lying in the bed with no acute distress.  EYES: Pupils equal, round, reactive to light and accommodation. No scleral icterus. Extraocular muscles intact.  HEENT: Head atraumatic, normocephalic. Oropharynx and nasopharynx clear.  NECK:  Supple, no jugular venous distention. No thyroid enlargement, no tenderness.  LUNGS: Normal breath sounds bilaterally, no wheezing, rales,rhonchi or crepitation. No use of accessory muscles of respiration.  CARDIOVASCULAR: S1, S2 normal. No murmurs, rubs, or gallops.  ABDOMEN: Soft, very minimal right lower quadrant abdominal pain no rebound tenderness, non-distended. Bowel sounds present. EXTREMITIES: No pedal edema, cyanosis, or clubbing.  NEUROLOGIC: Cranial nerves II through XII are intact. Muscle strength 5/5 in all extremities. Sensation intact. Gait not checked.  PSYCHIATRIC: The patient is alert and oriented x 3.  SKIN: No obvious rash, lesion, or ulcer.   DATA REVIEW:   CBC Recent Labs  Lab 06/13/18 0400  WBC 4.8  HGB 9.6*  HCT 29.0*  PLT 171    Chemistries  Recent Labs  Lab 06/11/18 0508  06/13/18 0400  NA 138   < > 141  K 3.6   < > 3.5  CL 106   < > 110  CO2 26   < > 25  GLUCOSE 93   < > 94  BUN 7   < > 8  CREATININE 0.76   < > 0.50  CALCIUM 8.1*   < > 8.1*  MG 1.9  --   --   AST  --    < > 30  ALT  --    < > 17  ALKPHOS  --    < > 93  BILITOT  --    < > 0.2*   < > = values in this interval not displayed.    Cardiac Enzymes No results for input(s): TROPONINI in the last 168 hours.  Microbiology Results  Results for orders placed or performed during the hospital encounter of 06/10/18  Urine culture     Status: None   Collection Time: 06/10/18  4:52 PM   Result Value Ref Range Status   Specimen Description   Final    URINE, CLEAN CATCH Performed at [facility], [address] [place], [place] [address]    Special Requests   Final    NONE Performed at [facility], [place],  [place], [place] [address]    Culture   Final    NO GROWTH Performed at [facility], [place] [address], [place], [place] [address]    Report Status 06/11/2018 FINAL  Final  Culture, blood (routine x 2)     Status: None (Preliminary result)   Collection Time: 06/10/18  4:52 PM  Result Value Ref Range Status   Specimen Description BLOOD RIGHT ANTECUBITAL  Final   Special Requests   Final    BOTTLES DRAWN AEROBIC AND ANAEROBIC Blood Culture adequate volume   Culture   Final    NO GROWTH 4 DAYS Performed at [facility], [address] [place], [place] [address]    Report Status PENDING  Incomplete  SARS Coronavirus 2 (CEPHEID - Performed in [place] hospital lab), Hosp Order     Status: None   Collection Time: 06/10/18  4:52 PM  Result Value Ref Range Status   SARS Coronavirus 2 NEGATIVE NEGATIVE Final    Comment: (NOTE) If result is NEGATIVE SARS-CoV-2 target nucleic acids are NOT DETECTED. The SARS-CoV-2 RNA is generally detectable in upper and lower  respiratory specimens during the acute phase of infection. The lowest  concentration of SARS-CoV-2 viral copies this assay can detect is 250  copies / mL. A negative result does not preclude SARS-CoV-2 infection  and should not be used as the sole basis for treatment or other  patient management decisions.  A negative result may occur with  improper specimen collection / handling, submission of specimen other  than nasopharyngeal swab, presence of viral mutation(s) within the  areas targeted by this assay, and inadequate number of viral copies  (<250 copies / mL). A negative result must be combined with clinical  observations, patient history, and  epidemiological information. If result is POSITIVE SARS-CoV-2 target nucleic acids are DETECTED. The SARS-CoV-2 RNA is generally detectable in upper and lower  respiratory specimens dur ing the acute phase of infection.  Positive  results are indicative of active infection with SARS-CoV-2.  Clinical  correlation with patient history and other diagnostic information is  necessary to determine patient infection status.  Positive results do  not rule out bacterial infection or co-infection with other viruses. If result is PRESUMPTIVE POSTIVE SARS-CoV-2 nucleic acids MAY BE PRESENT.   A presumptive positive result was obtained on the submitted specimen  and confirmed on repeat testing.  While 2019 novel coronavirus  (SARS-CoV-2) nucleic acids may be present in the submitted sample  additional confirmatory testing may be necessary for epidemiological  and / or clinical management purposes  to differentiate between  SARS-CoV-2 and other Sarbecovirus currently known to infect humans.  If clinically indicated additional testing with an alternate test  methodology [phone]) is advised. The SARS-CoV-2 RNA is generally  detectable in upper and lower respiratory sp ecimens during the acute  phase of infection. The expected result is Negative. Fact Sheet for Patients:  StrictlyIdeas.no Fact Sheet for Healthcare Providers: BankingDealers.co.za This test is not yet approved or cleared by the [place] FDA and has been authorized for detection and/or diagnosis of SARS-CoV-2 by FDA under an Emergency Use Authorization (EUA).  This EUA will remain in effect (meaning this test can be used) for the duration of the COVID-19 declaration under Section 564(b)(1) of the Act, 21 U.S.C. section 360bbb-3(b)(1), unless the authorization is terminated or revoked sooner. Performed at [facility], [address] [place], [place] [address]   Culture,  blood (routine x 2)  Status: None (Preliminary result)   Collection Time: 06/10/18  7:38 PM  Result Value Ref Range Status   Specimen Description BLOOD BLOOD LEFT HAND  Final   Special Requests   Final    BOTTLES DRAWN AEROBIC AND ANAEROBIC Blood Culture adequate volume   Culture   Final    NO GROWTH 4 DAYS Performed at [facility], [address], [place], [place] [address]    Report Status PENDING  Incomplete    RADIOLOGY:  Ct Head Wo Contrast  Result Date: 06/10/2018 CLINICAL DATA:  Acute headache EXAM: CT HEAD WITHOUT CONTRAST TECHNIQUE: Contiguous axial images were obtained from the base of the skull through the vertex without intravenous contrast. COMPARISON:  None. FINDINGS: Brain: There is no mass, hemorrhage or extra-axial collection. The size and configuration of the ventricles and extra-axial CSF spaces are normal. The brain parenchyma is normal, without acute or chronic infarction. Vascular: No abnormal hyperdensity of the major intracranial arteries or dural venous sinuses. No intracranial atherosclerosis. Skull: The visualized skull base, calvarium and extracranial soft tissues are normal. Sinuses/Orbits: No fluid levels or advanced mucosal thickening of the visualized paranasal sinuses. No mastoid or middle ear effusion. The orbits are normal. IMPRESSION: Normal head CT Electronically Signed   By: Ulyses Jarred M.D.   On: 06/10/2018 20:58   Ct Abdomen Pelvis W Contrast  Result Date: 06/13/2018 CLINICAL DATA:  Follow-up right flank pain and right lower quadrant abdominal pain. Questionable colitis. EXAM: CT ABDOMEN AND PELVIS WITH CONTRAST TECHNIQUE: Multidetector CT imaging of the abdomen and pelvis was performed using the standard protocol following bolus administration of intravenous contrast. CONTRAST:  115m OMNIPAQUE IOHEXOL 300 MG/ML  SOLN COMPARISON:  06/10/2018 FINDINGS: Lower chest: Mild dependent atelectasis the lung bases. No pleural fluid. Hepatobiliary: No  liver parenchymal abnormality. Previous cholecystectomy. Pancreas: Normal Spleen: Normal Adrenals/Urinary Tract: Adrenal glands are normal. The left kidney is normal except for a 19 mm cyst projecting inferiorly from the lower pole. There is fullness of the right renal collecting system and ureter which is a chronic finding. No evidence stone disease or obstructing lesion. Bladder appears normal. Stomach/Bowel: Previous gastric surgery. Previous appendectomy. Previous suggestion colon wall inflammation in the right colon is not confirmed today. Colon appears within normal limits. The patient does have some portal region/right upper quadrant edema that was not seen previously. The differential diagnosis for this appearance is peptic disease of the stomach or duodenum versus early evidence of pancreatitis of the head. Vascular/Lymphatic: Aortic atherosclerosis. No aneurysm. IVC is normal. Few small retroperitoneal lymph nodes are stable. Reproductive: Previous hysterectomy.  No pelvic mass. Other: Free fluid in the pelvis, similar to the previous study. Musculoskeletal: Negative IMPRESSION: Today's study does not suggest inflammatory disease of the right colon. No acute colon finding is seen today. However, there is newly seen edema in the portal region and right upper quadrant. I think the most likely etiology of this is peptic disease of the stomach or duodenum. Differential diagnosis does include early localized pancreatitis, but that is not favored. Electronically Signed   By: MNelson ChimesM.D.   On: 06/13/2018 16:48   Ct Renal Stone Study  Addendum Date: 06/10/2018   ADDENDUM REPORT: 06/10/2018 18:11 ADDENDUM: Upon further review, there is noted mild inflammatory changes around the colon suggesting mild inflammatory or infectious colitis. Also noted are several enlarged mesenteric lymph nodes in the right lower quadrant which most likely are inflammatory in etiology. These results were called by telephone at  the  time of interpretation on 06/10/2018 at 6:11 pm to Dr. Conni Slipper , who verbally acknowledged these results. Electronically Signed   By: Marijo Conception M.D.   On: 06/10/2018 18:11   Result Date: 06/10/2018 CLINICAL DATA:  Acute right-sided abdominal pain. EXAM: CT ABDOMEN AND PELVIS WITHOUT CONTRAST TECHNIQUE: Multidetector CT imaging of the abdomen and pelvis was performed following the standard protocol without IV contrast. COMPARISON:  CT scan of February 14, 2016. FINDINGS: Lower chest: No acute abnormality. Hepatobiliary: No focal liver abnormality is seen. Status post cholecystectomy. No biliary dilatation. Pancreas: Unremarkable. No pancreatic ductal dilatation or surrounding inflammatory changes. Spleen: Normal in size without focal abnormality. Adrenals/Urinary Tract: Adrenal glands are unremarkable. Kidneys are normal, without renal calculi, focal lesion, or hydronephrosis. Bladder is unremarkable. Stomach/Bowel: Status post gastric bypass. There is no evidence of bowel obstruction or inflammation. Status post appendectomy. Vascular/Lymphatic: Aortic atherosclerosis. No enlarged abdominal or pelvic lymph nodes. Reproductive: Status post hysterectomy. No adnexal masses. Other: No abdominal wall hernia or abnormality. No abdominopelvic ascites. Musculoskeletal: No acute or significant osseous findings. IMPRESSION: No acute abnormality seen in the abdomen or pelvis. Aortic Atherosclerosis (ICD10-I70.0). Electronically Signed: By: Marijo Conception M.D. On: 06/10/2018 16:31    EKG:   Orders placed or performed during the hospital encounter of 12/07/14  . EKG 12-Lead  . EKG 12-Lead  . EKG      Management plans discussed with the patient, husband over phone and they are in agreement.  CODE STATUS:     Code Status Orders  (From admission, onward)         Start     Ordered   06/10/18 2027  Full code  Continuous     06/10/18 2027        Code Status History    This patient has a  current code status but no historical code status.      TOTAL TIME TAKING CARE OF THIS PATIENT: 43  minutes.   Note: This dictation was prepared with Dragon dictation along with smaller phrase technology. Any transcriptional errors that result from this process are unintentional.   _0 @  on 06/14/2018 at 3:28 PM  Between 7am to 6pm - Pager - [phone]  After 6pm go to [email or web address] - password [email or web address] [facility]  Office  [phone]  CC: Primary care physician; Patient, No Pcp Per

## 2018-06-14 NOTE — Progress Notes (Signed)
Discharge instructions reviewed with patient including followup visits and new medications.  Understanding was verbalized and all questions were answered.  IV removed without complication; patient tolerated well.  Patient discharged home via wheelchair in stable condition escorted by nursing staff.  

## 2018-06-15 LAB — H. PYLORI BREATH TEST: H. pylori UBiT: NEGATIVE

## 2018-06-15 LAB — CULTURE, BLOOD (ROUTINE X 2)
Culture: NO GROWTH
Culture: NO GROWTH
Special Requests: ADEQUATE
Special Requests: ADEQUATE

## 2018-06-21 ENCOUNTER — Other Ambulatory Visit: Payer: Self-pay

## 2018-06-21 ENCOUNTER — Other Ambulatory Visit
Admission: RE | Admit: 2018-06-21 | Discharge: 2018-06-21 | Disposition: A | Discharge status: Home / Self Care | Payer: HRSA Program | Source: Ambulatory Visit | Attending: Internal Medicine | Admitting: Internal Medicine

## 2018-06-21 DIAGNOSIS — Z1159 Encounter for screening for other viral diseases: Secondary | ICD-10-CM | POA: Insufficient documentation

## 2018-06-21 DIAGNOSIS — Z01812 Encounter for preprocedural laboratory examination: Secondary | ICD-10-CM | POA: Diagnosis present

## 2018-06-22 ENCOUNTER — Other Ambulatory Visit: Payer: Self-pay | Admitting: Internal Medicine

## 2018-06-22 LAB — NOVEL CORONAVIRUS, NAA (HOSP ORDER, SEND-OUT TO REF LAB; TAT 18-24 HRS): SARS-CoV-2, NAA: NOT DETECTED

## 2018-06-25 ENCOUNTER — Encounter: Payer: Self-pay | Admitting: *Deleted

## 2018-06-26 ENCOUNTER — Encounter: Payer: Self-pay | Admitting: *Deleted

## 2018-06-26 ENCOUNTER — Ambulatory Visit
Admission: RE | Admit: 2018-06-26 | Discharge: 2018-06-26 | Disposition: A | Discharge status: Home / Self Care | Payer: Self-pay | Attending: Internal Medicine | Admitting: Internal Medicine

## 2018-06-26 ENCOUNTER — Encounter
Admission: RE | Disposition: A | Discharge status: Home / Self Care | Payer: Self-pay | Source: Home / Self Care | Attending: Internal Medicine

## 2018-06-26 ENCOUNTER — Ambulatory Visit: Payer: Self-pay | Admitting: Family

## 2018-06-26 DIAGNOSIS — Z79890 Hormone replacement therapy (postmenopausal): Secondary | ICD-10-CM | POA: Insufficient documentation

## 2018-06-26 DIAGNOSIS — Z9884 Bariatric surgery status: Secondary | ICD-10-CM | POA: Insufficient documentation

## 2018-06-26 DIAGNOSIS — K314 Gastric diverticulum: Secondary | ICD-10-CM | POA: Insufficient documentation

## 2018-06-26 DIAGNOSIS — Z79899 Other long term (current) drug therapy: Secondary | ICD-10-CM | POA: Insufficient documentation

## 2018-06-26 DIAGNOSIS — R1013 Epigastric pain: Secondary | ICD-10-CM | POA: Insufficient documentation

## 2018-06-26 DIAGNOSIS — Z1211 Encounter for screening for malignant neoplasm of colon: Secondary | ICD-10-CM | POA: Insufficient documentation

## 2018-06-26 DIAGNOSIS — N189 Chronic kidney disease, unspecified: Secondary | ICD-10-CM | POA: Insufficient documentation

## 2018-06-26 DIAGNOSIS — R933 Abnormal findings on diagnostic imaging of other parts of digestive tract: Secondary | ICD-10-CM | POA: Insufficient documentation

## 2018-06-26 HISTORY — PX: COLONOSCOPY WITH PROPOFOL: SHX5780

## 2018-06-26 HISTORY — PX: ESOPHAGOGASTRODUODENOSCOPY (EGD) WITH PROPOFOL: SHX5813

## 2018-06-26 SURGERY — COLONOSCOPY WITH PROPOFOL
Anesthesia: General

## 2018-06-26 MED ORDER — SODIUM CHLORIDE 0.9 % IV SOLN
INTRAVENOUS | Status: DC
  Administered 2018-06-26: 1000 mL via INTRAVENOUS

## 2018-06-26 MED ORDER — PROPOFOL 500 MG/50ML IV EMUL
INTRAVENOUS | Status: DC | PRN
  Administered 2018-06-26: 150 ug/kg/min via INTRAVENOUS

## 2018-06-26 MED ORDER — LIDOCAINE HCL (PF) 1 % IJ SOLN
INTRAMUSCULAR | Status: AC
  Filled 2018-06-26: qty 2

## 2018-06-26 MED ORDER — PROPOFOL 10 MG/ML IV BOLUS
INTRAVENOUS | Status: DC | PRN
  Administered 2018-06-26: 60 mg via INTRAVENOUS
  Administered 2018-06-26: 40 mg via INTRAVENOUS

## 2018-06-26 MED ORDER — PROPOFOL 10 MG/ML IV BOLUS
INTRAVENOUS | Status: AC
  Filled 2018-06-26: qty 40

## 2018-06-26 NOTE — Anesthesia Procedure Notes (Signed)
Procedure Name: MAC Date/Time: 06/26/2018 12:34 PM Performed by: Gentry Fitz, CRNA Pre-anesthesia Checklist: Patient identified, Emergency Drugs available, Suction available and Patient being monitored Patient Re-evaluated:Patient Re-evaluated prior to induction Oxygen Delivery Method: Nasal cannula Preoxygenation: Pre-oxygenation with 100% oxygen Induction Type: IV induction

## 2018-06-26 NOTE — Interval H&P Note (Signed)
History and Physical Interval Note:  06/26/2018 12:26 PM  Vickie Turner  has presented today for surgery, with the diagnosis of CCA ABN CT SCAN RUQ PAIN.  The various methods of treatment have been discussed with the patient and family. After consideration of risks, benefits and other options for treatment, the patient has consented to  Procedure(s) with comments: COLONOSCOPY WITH PROPOFOL (N/A) - NO INTERPRETER NECESSARY ESOPHAGOGASTRODUODENOSCOPY (EGD) WITH PROPOFOL (N/A) - NO INTERPRETER NECESSARY as a surgical intervention.  The patient's history has been reviewed, patient examined, no change in status, stable for surgery.  I have reviewed the patient's chart and labs.  Questions were answered to the patient's satisfaction.     [place], [place]

## 2018-06-26 NOTE — Op Note (Signed)
[facility] Gastroenterology Patient Name: Vickie Turner Procedure Date: 06/26/2018 11:08 AM MRN: [id number] Account #: [id number] Date of Birth: 05/29/1970 Admit Type: Outpatient Age: 48 Room: [facility] ENDO ROOM 3 Gender: Female Note Status: Finalized Procedure:            Upper GI endoscopy Indications:          Epigastric abdominal pain, Abnormal CT of the GI tract Providers:            Boykin Nearingeodoro K. Toledo MD, MD Medicines:            Propofol per Anesthesia Complications:        No immediate complications. Procedure:            Pre-Anesthesia Assessment:                       - The risks and benefits of the procedure and the                        sedation options and risks were discussed with the                        patient. All questions were answered and informed                        consent was obtained.                       - Patient identification and proposed procedure were                        verified prior to the procedure by the nurse. The                        procedure was verified in the procedure room.                       - ASA Grade Assessment: III - A patient with severe                        systemic disease.                       - After reviewing the risks and benefits, the patient                        was deemed in satisfactory condition to undergo the                        procedure.                       After obtaining informed consent, the endoscope was                        passed under direct vision. Throughout the procedure,                        the patient's blood pressure, pulse, and oxygen                        saturations were monitored continuously. The Endoscope  was introduced through the mouth, and advanced to the                        efferent jejunal loop. The upper GI endoscopy was                        accomplished without difficulty. The patient tolerated                        the  procedure well. Findings:      The examined esophagus was normal.      Evidence of a Roux-en-Y gastrojejunostomy was found. The gastrojejunal       anastomosis was characterized by healthy appearing mucosa. This was       traversed. The pouch-to-jejunum limb was characterized by healthy       appearing mucosa. The jejunojejunal anastomosis was characterized by       healthy appearing mucosa. The duodenum-to-jejunum limb was not examined       as it could not be traversed.      The examined jejunum was normal.      The exam was otherwise without abnormality.      A 3 mm non-bleeding diverticulum was found in the cardia. Impression:           - Normal esophagus.                       - Roux-en-Y gastrojejunostomy with gastrojejunal                        anastomosis characterized by healthy appearing mucosa.                       - Normal examined jejunum.                       - The examination was otherwise normal.                       - No specimens collected. Recommendation:       - Proceed with colonoscopy Procedure Code(s):    --- Professional ---                       [id number], Esophagogastroduodenoscopy, flexible, transoral;                        diagnostic, including collection of specimen(s) by                        brushing or washing, when performed (separate procedure) Diagnosis Code(s):    --- Professional ---                       R93.3, Abnormal findings on diagnostic imaging of other                        parts of digestive tract                       R10.13, Epigastric pain                       Z98.0, Intestinal bypass and anastomosis status CPT  copyright 2019 American Medical Association. All rights reserved. The codes documented in this report are preliminary and upon coder review may  be revised to meet current compliance requirements. Stanton Kidneyeodoro K Toledo MD, MD 06/26/2018 12:39:38 PM This report has been signed electronically. Number of Addenda: 0 Note Initiated  On: 06/26/2018 11:08 AM Estimated Blood Loss: Estimated blood loss: none.      [facility]

## 2018-06-26 NOTE — Op Note (Signed)
[facility] Gastroenterology Patient Name: Keyera Hattabaugh Procedure Date: 06/26/2018 11:07 AM MRN: [id number] Account #: [id number] Date of Birth: [birth year] Admit Type: Outpatient Age: 48 Room: [facility] ENDO ROOM 3 Gender: Female Note Status: Finalized Procedure:            Colonoscopy Indications:          Screening for colorectal malignant neoplasm Providers:            Benay Pike. Jolin Benavides MD, MD Medicines:            Propofol per Anesthesia Complications:        No immediate complications. Procedure:            Pre-Anesthesia Assessment:                       - The risks and benefits of the procedure and the                        sedation options and risks were discussed with the                        patient. All questions were answered and informed                        consent was obtained.                       - The risks and benefits of the procedure and the                        sedation options and risks were discussed with the                        patient. All questions were answered and informed                        consent was obtained.                       - Patient identification and proposed procedure were                        verified prior to the procedure by the nurse. The                        procedure was verified in the procedure room.                       - ASA Grade Assessment: II - A patient with mild                        systemic disease.                       - After reviewing the risks and benefits, the patient                        was deemed in satisfactory condition to undergo the                        procedure.  After obtaining informed consent, the colonoscope was                        passed under direct vision. Throughout the procedure,                        the patient's blood pressure, pulse, and oxygen                        saturations were monitored continuously. The   Colonoscope was introduced through the anus and                        advanced to the the cecum, identified by appendiceal                        orifice and ileocecal valve. The colonoscopy was                        somewhat difficult due to a redundant colon. Successful                        completion of the procedure was aided by withdrawing                        and reinserting the scope. The patient tolerated the                        procedure well. The quality of the bowel preparation                        was excellent. The ileocecal valve, appendiceal                        orifice, and rectum were photographed. Findings:      The perianal and digital rectal examinations were normal. Pertinent       negatives include normal sphincter tone and no palpable rectal lesions.      The colon (entire examined portion) appeared normal.      The exam was otherwise without abnormality on direct and retroflexion       views. Impression:           - The entire examined colon is normal.                       - The examination was otherwise normal on direct and                        retroflexion views.                       - No specimens collected. Recommendation:       - Patient has a contact number available for                        emergencies. The signs and symptoms of potential                        delayed complications were discussed with the patient.  Return to normal activities tomorrow. Written discharge                        instructions were provided to the patient.                       - Resume previous diet.                       - Continue present medications.                       - Repeat colonoscopy in 10 years for screening purposes.                       - Return to GI office in 3 months.                       - The findings and recommendations were discussed with                        the patient. Procedure Code(s):    --- Professional  ---                       [id number], Colorectal cancer screening; colonoscopy on                        individual not meeting criteria for high risk Diagnosis Code(s):    --- Professional ---                       Z12.11, Encounter for screening for malignant neoplasm                        of colon CPT copyright 2019 American Medical Association. All rights reserved. The codes documented in this report are preliminary and upon coder review may  be revised to meet current compliance requirements. Stanton Kidneyeodoro K Octave Montrose MD, MD 06/26/2018 12:55:40 PM This report has been signed electronically. Number of Addenda: 0 Note Initiated On: 06/26/2018 11:07 AM Scope Withdrawal Time: 0 hours 6 minutes 2 seconds  Total Procedure Duration: 0 hours 11 minutes 0 seconds  Estimated Blood Loss: Estimated blood loss: none.      [facility]

## 2018-06-26 NOTE — H&P (Signed)
Outpatient short stay form Pre-procedure 06/26/2018 10:42 AM Vickie Turner K. Vickie Turner, M.D.  Primary Physician: Clayborn Bigness, M.D>  Reason for visit:  RUQ pain, abnormal CT scan of the gastrointestinal tract.  History of present illness:  Patient presents for EGD and colonoscopy. Recently hospitalized for abdominal pain, with CT results showing "edema" in the area of the RUQ/portal region and interpreted as possible "peptic disease of the stomach and duodenum". Also presenting for colon cancer screening. Denies change in bowel habits, rectal bleeding, weight loss or family history of colon cancer/polyps. UBT negative in the hospital. Remote hx of Roux en Y gastric bypass.    No current facility-administered medications for this encounter.   Current Outpatient Medications:  .  acetaminophen (TYLENOL) 325 MG tablet, Take 2 tablets (650 mg total) by mouth every 6 (six) hours as needed for mild pain, moderate pain, fever or headache (headache)., Disp: , Rfl:  .  docusate sodium (COLACE) 100 MG capsule, Take 1 capsule (100 mg total) by mouth 2 (two) times daily., Disp: 10 capsule, Rfl: 0 .  estrogens, conjugated, (PREMARIN) 0.3 MG tablet, Take 1 tablet (0.3 mg total) by mouth daily. Take daily for 21 days then do not take for 7 days., Disp: 90 tablet, Rfl: 3 .  pantoprazole (PROTONIX) 40 MG tablet, Take 1 tablet (40 mg total) by mouth 2 (two) times daily., Disp: 60 tablet, Rfl: 0 .  polyethylene glycol (MIRALAX / GLYCOLAX) 17 g packet, Take 17 g by mouth daily as needed for moderate constipation., Disp: 14 each, Rfl: 0 .  traMADol (ULTRAM) 50 MG tablet, Take 1 tablet (50 mg total) by mouth every 6 (six) hours as needed for moderate pain., Disp: 20 tablet, Rfl: 0  No medications prior to admission.     Allergies  Allergen Reactions  . Sulfa Antibiotics Rash and Shortness Of Breath  . Other     Pork- religion contradiction    . Tape     Other reaction(s): Other (See Comments) PLASTIC MEDICAL TAPE,  Unknown Kind of Tape     Past Medical History:  Diagnosis Date  . BRCA negative 11/2016   MyRisk neg, except BRCA 1 VUS; IBIS=5.2%  . Chronic kidney disease   . Family history of ovarian cancer   . Urinary (tract) obstruction     Review of systems:  Otherwise negative.    Physical Exam  Gen: Alert, oriented. Appears stated age.  HEENT: [place]/AT. PERRLA. Lungs: CTA, no wheezes. CV: RR nl S1, S2. Abd: soft, benign, no masses. BS+ Ext: No edema. Pulses 2+    Planned procedures: Proceed with EGD and colonoscopy. The patient understands the nature of the planned procedure, indications, risks, alternatives and potential complications including but not limited to bleeding, infection, perforation, damage to internal organs and possible oversedation/side effects from anesthesia. The patient agrees and gives consent to proceed.  Please refer to procedure notes for findings, recommendations and patient disposition/instructions.     Sage Kopera K. Vickie Turner, M.D. Gastroenterology 06/26/2018  10:42 AM

## 2018-06-26 NOTE — Anesthesia Post-op Follow-up Note (Signed)
Anesthesia QCDR form completed.        

## 2018-06-26 NOTE — Anesthesia Postprocedure Evaluation (Signed)
Anesthesia Post Note  Patient: Daleen A Mccleese  Procedure(s) Performed: COLONOSCOPY WITH PROPOFOL (N/A ) ESOPHAGOGASTRODUODENOSCOPY (EGD) WITH PROPOFOL (N/A )  Patient location during evaluation: Endoscopy Anesthesia Type: General Level of consciousness: awake and alert and oriented Pain management: pain level controlled Vital Signs Assessment: post-procedure vital signs reviewed and stable Respiratory status: spontaneous breathing, nonlabored ventilation and respiratory function stable Cardiovascular status: blood pressure returned to baseline and stable Postop Assessment: no signs of nausea or vomiting Anesthetic complications: no     Last Vitals:  Vitals:   06/26/18 1315 06/26/18 1318  BP: 121/77   Pulse: 72 73  Resp: 15 16  Temp:    SpO2: 100% 100%    Last Pain:  Vitals:   06/26/18 1318  TempSrc:   PainSc: 0-No pain                 Latashia Koch

## 2018-06-26 NOTE — Anesthesia Preprocedure Evaluation (Signed)
Anesthesia Evaluation  Patient identified by MRN, date of birth, ID band Patient awake    Reviewed: Allergy & Precautions, NPO status , Patient's Chart, lab work & pertinent test results  History of Anesthesia Complications Negative for: history of anesthetic complications  Airway Mallampati: II  TM Distance: >3 FB Neck ROM: Full    Dental  (+) Missing   Pulmonary neg pulmonary ROS, neg sleep apnea, neg COPD,    breath sounds clear to auscultation- rhonchi (-) wheezing      Cardiovascular Exercise Tolerance: Good (-) hypertension(-) CAD and (-) Past MI  Rhythm:Regular Rate:Normal - Systolic murmurs and - Diastolic murmurs    Neuro/Psych negative neurological ROS  negative psych ROS   GI/Hepatic negative GI ROS, Neg liver ROS,   Endo/Other  negative endocrine ROSneg diabetes  Renal/GU negative Renal ROS     Musculoskeletal negative musculoskeletal ROS (+)   Abdominal (+) - obese,   Peds  Hematology  (+) anemia ,   Anesthesia Other Findings Past Medical History: 11/2016: BRCA negative     Comment:  MyRisk neg, except BRCA 1 VUS; IBIS=5.2% No date: Chronic kidney disease No date: Family history of ovarian cancer No date: Urinary (tract) obstruction   Reproductive/Obstetrics                             Anesthesia Physical Anesthesia Plan  ASA: II  Anesthesia Plan: General   Post-op Pain Management:    Induction: Intravenous  PONV Risk Score and Plan: 2 and Propofol infusion  Airway Management Planned: Natural Airway  Additional Equipment:   Intra-op Plan:   Post-operative Plan:   Informed Consent: I have reviewed the patients History and Physical, chart, labs and discussed the procedure including the risks, benefits and alternatives for the proposed anesthesia with the patient or authorized representative who has indicated his/her understanding and acceptance.     Dental  advisory given  Plan Discussed with: CRNA and Anesthesiologist  Anesthesia Plan Comments:         Anesthesia Quick Evaluation  

## 2018-06-26 NOTE — Transfer of Care (Signed)
Immediate Anesthesia Transfer of Care Note  Patient: Vickie Turner  Procedure(s) Performed: COLONOSCOPY WITH PROPOFOL (N/A ) ESOPHAGOGASTRODUODENOSCOPY (EGD) WITH PROPOFOL (N/A )  Patient Location: PACU  Anesthesia Type:General  Level of Consciousness: drowsy  Airway & Oxygen Therapy: Patient Spontanous Breathing and Patient connected to nasal cannula oxygen  Post-op Assessment: Report given to RN and Post -op Vital signs reviewed and stable  Post vital signs: Reviewed and stable  Last Vitals:  Vitals Value Taken Time  BP 105/72 06/26/18 1255  Temp 36.1 C 06/26/18 1255  Pulse 81 06/26/18 1256  Resp 16 06/26/18 1256  SpO2 100 % 06/26/18 1256  Vitals shown include unvalidated device data.  Last Pain:  Vitals:   06/26/18 1255  TempSrc: Tympanic  PainSc:          Complications: No apparent anesthesia complications

## 2018-07-18 ENCOUNTER — Other Ambulatory Visit: Payer: Self-pay | Admitting: Internal Medicine

## 2018-07-23 ENCOUNTER — Other Ambulatory Visit: Payer: Self-pay | Admitting: Internal Medicine

## 2018-07-24 ENCOUNTER — Other Ambulatory Visit: Payer: Self-pay | Admitting: Internal Medicine

## 2019-04-04 ENCOUNTER — Other Ambulatory Visit: Payer: Self-pay | Admitting: Internal Medicine

## 2019-04-06 LAB — COMPREHENSIVE METABOLIC PANEL
ALT: 14 IU/L (ref 0–32)
AST: 24 IU/L (ref 0–40)
Albumin/Globulin Ratio: 1.9 (ref 1.2–2.2)
Albumin: 4.1 g/dL (ref 3.8–4.9)
Alkaline Phosphatase: 146 IU/L — ABNORMAL HIGH (ref 39–117)
BUN/Creatinine Ratio: 9 (ref 9–23)
BUN: 6 mg/dL (ref 6–24)
Bilirubin Total: 0.3 mg/dL (ref 0.0–1.2)
CO2: 24 mmol/L (ref 20–29)
Calcium: 9.3 mg/dL (ref 8.7–10.2)
Chloride: 103 mmol/L (ref 96–106)
Creatinine, Ser: 0.7 mg/dL (ref 0.57–1.00)
GFR calc Af Amer: 116 mL/min/{1.73_m2} (ref >59)
GFR calc non Af Amer: 101 mL/min/{1.73_m2} (ref >59)
Globulin, Total: 2.2 g/dL (ref 1.5–4.5)
Glucose: 89 mg/dL (ref 65–99)
Potassium: 3.4 mmol/L — ABNORMAL LOW (ref 3.5–5.2)
Sodium: 141 mmol/L (ref 134–144)
Total Protein: 6.3 g/dL (ref 6.0–8.5)

## 2019-04-06 LAB — B12 AND FOLATE PANEL
Folate: 8.7 ng/mL (ref >3.0)
Vitamin B-12: 307 pg/mL (ref 232–1245)

## 2019-04-06 LAB — LIPID PANEL W/O CHOL/HDL RATIO
Cholesterol, Total: 199 mg/dL (ref 100–199)
HDL: 39 mg/dL — ABNORMAL LOW (ref >39)
LDL Chol Calc (NIH): 138 mg/dL — ABNORMAL HIGH (ref 0–99)
Triglycerides: 122 mg/dL (ref 0–149)
VLDL Cholesterol Cal: 22 mg/dL (ref 5–40)

## 2019-04-06 LAB — VITAMIN D 25 HYDROXY (VIT D DEFICIENCY, FRACTURES): Vit D, 25-Hydroxy: 11.2 ng/mL — ABNORMAL LOW (ref 30.0–100.0)

## 2019-04-06 LAB — CBC WITH DIFFERENTIAL/PLATELET
Basophils Absolute: 0 10*3/uL (ref 0.0–0.2)
Basos: 0 %
EOS (ABSOLUTE): 0.1 10*3/uL (ref 0.0–0.4)
Eos: 1 %
Hematocrit: 34.5 % (ref 34.0–46.6)
Hemoglobin: 11.8 g/dL (ref 11.1–15.9)
Immature Grans (Abs): 0 10*3/uL (ref 0.0–0.1)
Immature Granulocytes: 0 %
Lymphocytes Absolute: 2.8 10*3/uL (ref 0.7–3.1)
Lymphs: 51 %
MCH: 28.1 pg (ref 26.6–33.0)
MCHC: 34.2 g/dL (ref 31.5–35.7)
MCV: 82 fL (ref 79–97)
Monocytes Absolute: 0.2 10*3/uL (ref 0.1–0.9)
Monocytes: 4 %
Neutrophils Absolute: 2.5 10*3/uL (ref 1.4–7.0)
Neutrophils: 44 %
Platelets: 271 10*3/uL (ref 150–450)
RBC: 4.2 x10E6/uL (ref 3.77–5.28)
RDW: 13.2 % (ref 11.7–15.4)
WBC: 5.6 10*3/uL (ref 3.4–10.8)

## 2019-04-06 LAB — ZINC: Zinc: 64 ug/dL (ref 44–115)

## 2019-04-06 LAB — T4, FREE: Free T4: 1.16 ng/dL (ref 0.82–1.77)

## 2019-04-06 LAB — IRON AND TIBC
Iron Saturation: 21 % (ref 15–55)
Iron: 78 ug/dL (ref 27–159)
Total Iron Binding Capacity: 367 ug/dL (ref 250–450)
UIBC: 289 ug/dL (ref 131–425)

## 2019-04-06 LAB — URIC ACID: Uric Acid: 4.7 mg/dL (ref 3.0–7.2)

## 2019-04-06 LAB — TSH: TSH: 0.848 u[IU]/mL (ref 0.450–4.500)

## 2019-04-06 LAB — FERRITIN: Ferritin: 14 ng/mL — ABNORMAL LOW (ref 15–150)

## 2019-04-06 LAB — HGB A1C W/O EAG: Hgb A1c MFr Bld: 5.1 % (ref 4.8–5.6)

## 2019-04-06 LAB — T3: T3, Total: 153 ng/dL (ref 71–180)

## 2019-07-30 ENCOUNTER — Other Ambulatory Visit: Payer: Self-pay | Admitting: Internal Medicine

## 2019-08-01 LAB — LIPID PANEL W/O CHOL/HDL RATIO
Cholesterol, Total: 212 mg/dL — ABNORMAL HIGH (ref 100–199)
HDL: 40 mg/dL (ref >39)
LDL Chol Calc (NIH): 147 mg/dL — ABNORMAL HIGH (ref 0–99)
Triglycerides: 139 mg/dL (ref 0–149)
VLDL Cholesterol Cal: 25 mg/dL (ref 5–40)

## 2019-08-01 LAB — COMPREHENSIVE METABOLIC PANEL
ALT: 13 IU/L (ref 0–32)
AST: 22 IU/L (ref 0–40)
Albumin/Globulin Ratio: 2 (ref 1.2–2.2)
Albumin: 4.3 g/dL (ref 3.8–4.9)
Alkaline Phosphatase: 141 IU/L — ABNORMAL HIGH (ref 48–121)
BUN/Creatinine Ratio: 11 (ref 9–23)
BUN: 9 mg/dL (ref 6–24)
Bilirubin Total: 0.4 mg/dL (ref 0.0–1.2)
CO2: 26 mmol/L (ref 20–29)
Calcium: 9.1 mg/dL (ref 8.7–10.2)
Chloride: 100 mmol/L (ref 96–106)
Creatinine, Ser: 0.79 mg/dL (ref 0.57–1.00)
GFR calc Af Amer: 100 mL/min/{1.73_m2} (ref >59)
GFR calc non Af Amer: 87 mL/min/{1.73_m2} (ref >59)
Globulin, Total: 2.1 g/dL (ref 1.5–4.5)
Glucose: 93 mg/dL (ref 65–99)
Potassium: 3.1 mmol/L — ABNORMAL LOW (ref 3.5–5.2)
Sodium: 141 mmol/L (ref 134–144)
Total Protein: 6.4 g/dL (ref 6.0–8.5)

## 2019-08-01 LAB — IRON AND TIBC
Iron Saturation: 18 % (ref 15–55)
Iron: 76 ug/dL (ref 27–159)
Total Iron Binding Capacity: 420 ug/dL (ref 250–450)
UIBC: 344 ug/dL (ref 131–425)

## 2019-08-01 LAB — URINE CULTURE

## 2019-08-01 LAB — FERRITIN: Ferritin: 15 ng/mL (ref 15–150)

## 2019-08-01 LAB — VITAMIN D 25 HYDROXY (VIT D DEFICIENCY, FRACTURES): Vit D, 25-Hydroxy: 17.1 ng/mL — ABNORMAL LOW (ref 30.0–100.0)

## 2019-08-01 LAB — RENAL FUNCTION PANEL: Phosphorus: 3.6 mg/dL (ref 3.0–4.3)

## 2019-08-04 ENCOUNTER — Other Ambulatory Visit: Payer: Self-pay | Admitting: Internal Medicine

## 2019-08-05 LAB — CBC WITH DIFFERENTIAL/PLATELET
Basophils Absolute: 0 10*3/uL (ref 0.0–0.2)
Basos: 1 %
EOS (ABSOLUTE): 0.1 10*3/uL (ref 0.0–0.4)
Eos: 1 %
Hematocrit: 34.4 % (ref 34.0–46.6)
Hemoglobin: 11.6 g/dL (ref 11.1–15.9)
Immature Grans (Abs): 0 10*3/uL (ref 0.0–0.1)
Immature Granulocytes: 0 %
Lymphocytes Absolute: 3.3 10*3/uL — ABNORMAL HIGH (ref 0.7–3.1)
Lymphs: 54 %
MCH: 27.2 pg (ref 26.6–33.0)
MCHC: 33.7 g/dL (ref 31.5–35.7)
MCV: 81 fL (ref 79–97)
Monocytes Absolute: 0.3 10*3/uL (ref 0.1–0.9)
Monocytes: 5 %
Neutrophils Absolute: 2.4 10*3/uL (ref 1.4–7.0)
Neutrophils: 39 %
Platelets: 254 10*3/uL (ref 150–450)
RBC: 4.26 x10E6/uL (ref 3.77–5.28)
RDW: 14.2 % (ref 11.7–15.4)
WBC: 6.1 10*3/uL (ref 3.4–10.8)

## 2019-08-05 LAB — URINALYSIS, ROUTINE W REFLEX MICROSCOPIC
Bilirubin, UA: NEGATIVE
Glucose, UA: NEGATIVE
Ketones, UA: NEGATIVE
Nitrite, UA: NEGATIVE
Protein,UA: NEGATIVE
RBC, UA: NEGATIVE
Specific Gravity, UA: 1.017 (ref 1.005–1.030)
Urobilinogen, Ur: 0.2 mg/dL (ref 0.2–1.0)
pH, UA: 6.5 (ref 5.0–7.5)

## 2019-08-05 LAB — MICROSCOPIC EXAMINATION
Bacteria, UA: NONE SEEN
Casts: NONE SEEN /lpf

## 2019-09-04 ENCOUNTER — Other Ambulatory Visit: Payer: Self-pay | Admitting: Internal Medicine

## 2019-09-07 ENCOUNTER — Other Ambulatory Visit: Payer: Self-pay

## 2019-09-07 ENCOUNTER — Emergency Department: Payer: PRIVATE HEALTH INSURANCE

## 2019-09-07 ENCOUNTER — Emergency Department
Admission: EM | Admit: 2019-09-07 | Discharge: 2019-09-07 | Disposition: A | Discharge status: Home / Self Care | Payer: PRIVATE HEALTH INSURANCE | Attending: Emergency Medicine | Admitting: Emergency Medicine

## 2019-09-07 DIAGNOSIS — L989 Disorder of the skin and subcutaneous tissue, unspecified: Secondary | ICD-10-CM | POA: Diagnosis not present

## 2019-09-07 DIAGNOSIS — N189 Chronic kidney disease, unspecified: Secondary | ICD-10-CM | POA: Diagnosis not present

## 2019-09-07 DIAGNOSIS — B079 Viral wart, unspecified: Secondary | ICD-10-CM | POA: Diagnosis not present

## 2019-09-07 DIAGNOSIS — L03012 Cellulitis of left finger: Secondary | ICD-10-CM | POA: Diagnosis present

## 2019-09-07 MED ORDER — DOXYCYCLINE HYCLATE 100 MG PO TABS
100.0000 mg | ORAL_TABLET | Freq: Once | ORAL | Status: AC
  Administered 2019-09-07: 100 mg via ORAL
  Filled 2019-09-07: qty 1

## 2019-09-07 MED ORDER — DOXYCYCLINE HYCLATE 100 MG PO TABS: 100.0000 mg | ORAL_TABLET | Freq: Two times a day (BID) | ORAL | 0 refills | Status: DC

## 2019-09-07 MED ORDER — MUPIROCIN 2 % EX OINT: TOPICAL_OINTMENT | CUTANEOUS | 0 refills | Status: DC

## 2019-09-07 NOTE — Discharge Instructions (Addendum)
Take the antibiotic as directed. Consider a follow-up with the wound care center for further treatment. Return as needed.

## 2019-09-07 NOTE — ED Triage Notes (Signed)
Pt comes via POV from home with c/o left thumb infection. Pt states this started 3 weeks ago. Pt states she has been using antibiotic cream. Pt states it is getting worse.  Pt has bandage in place.

## 2019-09-07 NOTE — ED Provider Notes (Signed)
[facility] Emergency Department Provider Note ____________________________________________  Time seen: 2149  I have reviewed the triage vital signs and the nursing notes.  HISTORY  Chief Complaint  Left thumb infection  HPI Vickie Turner is a 49 y.o. female presents to the ED accompanied by her husband, for evaluation of a lesion to the left thumb has been present for the last 3 weeks.   She reports the injury likely occurred the ribs ago when she was gardening and may have gotten a small puncture to the thumb from a plant or bush.  Patient denies any known retained foreign body.  Since that time she has had a wound that has not healed but denies any significant purulent drainage.  She has been keeping the wound clean and dry daily, and today when she went to apply peroxide to the wound she described increased pain and some local bleeding.  Patient denies any interim fever, chills, or sweats.  Past Medical History:  Diagnosis Date  . BRCA negative 11/2016   MyRisk neg, except BRCA 1 VUS; IBIS=5.2%  . Chronic kidney disease   . Family history of ovarian cancer   . Urinary (tract) obstruction     Patient Active Problem List   Diagnosis Date Noted  . Colitis 06/10/2018  . Family history of ovarian cancer 12/27/2016  . CIN I (cervical intraepithelial neoplasia I) 07/24/2016  . Iron deficiency anemia 08/09/2015  . Easy bruising 08/09/2015  . SOB (shortness of breath) 11/14/2013  . Palpitations 11/14/2013    Past Surgical History:  Procedure Laterality Date  . ABDOMINAL HYSTERECTOMY    . APPENDECTOMY    . BACK SURGERY    . CHOLECYSTECTOMY    . COLONOSCOPY WITH PROPOFOL N/A 06/26/2018   Procedure: COLONOSCOPY WITH PROPOFOL;  Surgeon: Toledo, Benay Pike, MD;  Location: [facility] ENDOSCOPY;  Service: Gastroenterology;  Laterality: N/A;  NO INTERPRETER NECESSARY  . ESOPHAGOGASTRODUODENOSCOPY (EGD) WITH PROPOFOL N/A 06/26/2018   Procedure: ESOPHAGOGASTRODUODENOSCOPY  (EGD) WITH PROPOFOL;  Surgeon: Toledo, Benay Pike, MD;  Location: [facility] ENDOSCOPY;  Service: Gastroenterology;  Laterality: N/A;  NO INTERPRETER NECESSARY  . PERIPHERAL VASCULAR CATHETERIZATION  10/02/2014   Procedure: PICC Line Insertion;  Surgeon: Algernon Huxley, MD;  Location: [facility];  Service: Cardiovascular;;  . TONSILLECTOMY Bilateral     Prior to Admission medications   Medication Sig Start Date End Date Taking? Authorizing Provider  acetaminophen (TYLENOL) 325 MG tablet Take 2 tablets (650 mg total) by mouth every 6 (six) hours as needed for mild pain, moderate pain, fever or headache (headache). 06/14/18   Nicholes Mango, MD  docusate sodium (COLACE) 100 MG capsule Take 1 capsule (100 mg total) by mouth 2 (two) times daily. 06/14/18   Nicholes Mango, MD  doxycycline (VIBRA-TABS) 100 MG tablet Take 1 tablet (100 mg total) by mouth 2 (two) times daily. 09/07/19   Zorina Mallin, Dannielle Karvonen, PA-C  estrogens, conjugated, (PREMARIN) 0.3 MG tablet Take 1 tablet (0.3 mg total) by mouth daily. Take daily for 21 days then do not take for 7 days. 09/05/17   Gae Dry, MD  mupirocin ointment Drue Stager) 2 % Apply to affected area 3 times daily 09/07/19   Aundraya Dripps, Dannielle Karvonen, PA-C  pantoprazole (PROTONIX) 40 MG tablet Take 1 tablet (40 mg total) by mouth 2 (two) times daily. 06/14/18   Gouru, Illene Silver, MD  polyethylene glycol (MIRALAX / GLYCOLAX) 17 g packet Take 17 g by mouth daily as needed for moderate constipation. 06/14/18  Nicholes Mango, MD  traMADol (ULTRAM) 50 MG tablet Take 1 tablet (50 mg total) by mouth every 6 (six) hours as needed for moderate pain. 06/14/18   Nicholes Mango, MD    Allergies Sulfa antibiotics, Other, and Tape  Family History  Problem Relation Age of Onset  . Heart Problems Brother   . Prostate cancer Brother 25       pat 1/2 brother  . Ovarian cancer Sister 33       pat 1/2 sister  . Cancer Sister 43       pat 1/2 sister--hepatobiliary  . Kidney cancer Maternal  Uncle 67    Social History Social History   Tobacco Use  . Smoking status: Never Smoker  . Smokeless tobacco: Never Used  Vaping Use  . Vaping Use: Never used  Substance Use Topics  . Alcohol use: No  . Drug use: No    Review of Systems  Constitutional: Negative for fever. Cardiovascular: Negative for chest pain. Respiratory: Negative for shortness of breath. Musculoskeletal: Negative for back pain. Skin: Negative for rash.  Left thumb lesion as above. Neurological: Negative for headaches, focal weakness or numbness. ____________________________________________  PHYSICAL EXAM:  VITAL SIGNS: ED Triage Vitals  Enc Vitals Group     BP 09/07/19 1845 104/60     Pulse Rate 09/07/19 1845 88     Resp 09/07/19 1845 18     Temp 09/07/19 1845 97.8 F (36.6 C)     Temp Source 09/07/19 2306 Oral     SpO2 09/07/19 1845 100 %     Weight 09/07/19 1845 165 lb (74.8 kg)     Height 09/07/19 1845 '5\' 8"'  (1.727 m)     Head Circumference --      Peak Flow --      Pain Score 09/07/19 1845 10     Pain Loc --      Pain Edu? --      Excl. in [place]? --     Constitutional: Alert and oriented. Well appearing and in no distress. Head: Normocephalic and atraumatic. Eyes: Conjunctivae are normal. Normal extraocular movements Cardiovascular: Normal rate, regular rhythm. Normal distal pulses. Respiratory: Normal respiratory effort.  Musculoskeletal: Normal composite fist on the left.  Nontender with normal range of motion in all extremities.  Neurologic:  Normal gross sensation. Normal speech and language. No gross focal neurologic deficits are appreciated. Skin:  Skin is warm, dry and intact. No rash noted.  Left thumb with a 1 cm papular growth which is raised and rough in texture. No lymphangitis, warmth, erythema, or induration. No purulent drainage is noted.  ___________________________________________   RADIOLOGY  DG Left  Thumb  Negative ____________________________________________  PROCEDURES  Procedures   Doxycycline 100 mg PO  ____________________________________________  INITIAL IMPRESSION / ASSESSMENT AND PLAN / ED COURSE  Patient with ED evaluation of a persistent raised papilloma fat pad of the left thumb with concern for infection.  Patient describes areas been present for approximately 3 weeks.  She denies any change or improvement.  No signs of outward infection or cellulitis are noted.  Patient will however, be placed on doxycycline for prophylaxis.  X-rays not reveal any retained foreign body.  Patient is advised to keep the area covered.  She will follow-up with [facility] wound center or return to the ED as necessary.  Nakai A Duerst was evaluated in Emergency Department on 09/08/2019 for the symptoms described in the history of present illness. She was evaluated in the context of  the global COVID-19 pandemic, which necessitated consideration that the patient might be at risk for infection with the SARS-CoV-2 virus that causes COVID-19. Institutional protocols and algorithms that pertain to the evaluation of patients at risk for COVID-19 are in a state of rapid change based on information released by regulatory bodies including the CDC and federal and state organizations. These policies and algorithms were followed during the patient's care in the ED. ____________________________________________  FINAL CLINICAL IMPRESSION(S) / ED DIAGNOSES  Final diagnoses:  Thumb lesion  Wart on thumb      Carmie End, Dannielle Karvonen, PA-C 09/08/19 0007    Arta Silence, MD 09/08/19 1505

## 2019-09-07 NOTE — ED Notes (Signed)
Pt reports approx 3 weeks ago she was gardening and thinks one of the thorns clipped her thumb. Pt reports subsequent infection, with prescription for antibiotic ointment. Pt states she has been using it without relief, and pain at this time is radiating towards the base of her thumb.   Pt reports chills with no fever for past two days. Pt with restricted mobility to distal joint. Pt reports greatly increased pain as of today

## 2019-09-08 ENCOUNTER — Other Ambulatory Visit: Payer: Self-pay

## 2019-09-08 ENCOUNTER — Ambulatory Visit: Payer: Self-pay | Admitting: Obstetrics and Gynecology

## 2019-09-08 ENCOUNTER — Ambulatory Visit (INDEPENDENT_AMBULATORY_CARE_PROVIDER_SITE_OTHER): Payer: PRIVATE HEALTH INSURANCE | Admitting: Dermatology

## 2019-09-08 DIAGNOSIS — L988 Other specified disorders of the skin and subcutaneous tissue: Secondary | ICD-10-CM

## 2019-09-08 DIAGNOSIS — L03114 Cellulitis of left upper limb: Secondary | ICD-10-CM | POA: Diagnosis not present

## 2019-09-08 NOTE — Progress Notes (Signed)
   New Patient Visit  Subjective  Vickie Turner is a 49 y.o. female who presents for the following: wound (of the L thumb - occured about 3 weeks ago after working in the garden. Patient was seen in ER yesterday where she was prescribed Doxycycline 100mg  po BID and Mupirocin 2% ointment. X-rays were performed at that time which were negative ) and Facial Elastosis (patient would like to discuss treatment options).  The following portions of the chart were reviewed this encounter and updated as appropriate:  Tobacco  Allergies  Meds  Problems  Med Hx  Surg Hx  Fam Hx     Review of Systems:  No other skin or systemic complaints except as noted in HPI or Assessment and Plan.  Objective  Well appearing patient in no apparent distress; mood and affect are within normal limits.  A focused examination was performed including the L hand. Relevant physical exam findings are noted in the Assessment and Plan.  Objective  L thumb: Crusted erosions  Images    Objective  Face: Rhytides and volume loss.   Assessment & Plan  Cellulitis of left upper extremity L thumb and L index finger Undetermined etiology no hx of burns or bites at aa's, no recent travel out of the county, x-ray negative for foreign body (but advised pt that x-ray may not show foreign body), -  Occurred after yard work. Finish Doxycycline as prescribed by E.D. and continue Mupirocin 2% ointment until healed. If draining keep covered. RTC if condition worsens before follow up appointment in 6 weeks.  DDx includes Sporotrichosis (and other deep fungal infection from yard work), but not showing signs of this at this time.  If changes or does not heal, consider biopsy.  Elastosis of skin Face Patient is interested in Halo treatment - pamphlet given. Patient to follow up with the same day she returns to the office for follow up.   Briefly discussed Botox and filler. Advised patient of cost per syringe (filler)  and per unit of Botox. Discussed appointment times of 1:30 or 2:00 pm on a Wednesday or Thursday, and she could consult and treat the same day.   Return in about 6 weeks (around 10/20/2019) for follow up with Vickie Turner. Schedule on the same with Vickie Turner to discuss Halo laser treatment.  Vickie Turner, CMA, am acting as scribe for Vickie Roes, MD .  Documentation: I have reviewed the above documentation for accuracy and completeness, and I agree with the above.  Vickie Sans, MD

## 2019-09-12 LAB — MICROSCOPIC EXAMINATION
Casts: NONE SEEN /lpf
RBC, Urine: NONE SEEN /hpf (ref 0–2)
WBC, UA: >30 /hpf — AB (ref 0–5)

## 2019-09-12 LAB — URINALYSIS, ROUTINE W REFLEX MICROSCOPIC
Bilirubin, UA: NEGATIVE
Glucose, UA: NEGATIVE
Ketones, UA: NEGATIVE
Nitrite, UA: POSITIVE — AB
Protein,UA: NEGATIVE
Specific Gravity, UA: 1.008 (ref 1.005–1.030)
Urobilinogen, Ur: 0.2 mg/dL (ref 0.2–1.0)
pH, UA: 6.5 (ref 5.0–7.5)

## 2019-09-12 LAB — URINE CULTURE

## 2019-09-13 ENCOUNTER — Encounter: Payer: Self-pay | Admitting: Dermatology

## 2019-09-19 ENCOUNTER — Ambulatory Visit (INDEPENDENT_AMBULATORY_CARE_PROVIDER_SITE_OTHER): Payer: No Typology Code available for payment source | Admitting: Obstetrics & Gynecology

## 2019-09-19 ENCOUNTER — Other Ambulatory Visit: Payer: Self-pay

## 2019-09-19 ENCOUNTER — Other Ambulatory Visit (HOSPITAL_COMMUNITY)
Admission: RE | Admit: 2019-09-19 | Discharge: 2019-09-19 | Disposition: A | Discharge status: Home / Self Care | Payer: No Typology Code available for payment source | Source: Ambulatory Visit | Attending: Obstetrics & Gynecology | Admitting: Obstetrics & Gynecology

## 2019-09-19 ENCOUNTER — Encounter: Payer: Self-pay | Admitting: Obstetrics & Gynecology

## 2019-09-19 VITALS — BP 100/60 | Ht 68.0 in | Wt 169.0 lb

## 2019-09-19 DIAGNOSIS — Z01419 Encounter for gynecological examination (general) (routine) without abnormal findings: Secondary | ICD-10-CM | POA: Diagnosis not present

## 2019-09-19 DIAGNOSIS — Z23 Encounter for immunization: Secondary | ICD-10-CM

## 2019-09-19 DIAGNOSIS — Z1231 Encounter for screening mammogram for malignant neoplasm of breast: Secondary | ICD-10-CM | POA: Diagnosis not present

## 2019-09-19 DIAGNOSIS — N952 Postmenopausal atrophic vaginitis: Secondary | ICD-10-CM | POA: Diagnosis not present

## 2019-09-19 DIAGNOSIS — N870 Mild cervical dysplasia: Secondary | ICD-10-CM | POA: Insufficient documentation

## 2019-09-19 MED ORDER — REPLENS VA GEL: 1.0000 | VAGINAL | 11 refills | Status: DC

## 2019-09-19 NOTE — Patient Instructions (Signed)
PAP every year Mammogram every year    Call [phone] to schedule at [facility] Colonoscopy every 10 years Labs yearly (with PCP)  Thank you for choosing [facility]. As part of our ongoing efforts to improve patient experience, we would appreciate your feedback. Please fill out the short survey that you will receive by mail or MyChart. Your opinion is important to [place]! - Dr. Tiburcio Pea    Atrophic Vaginitis  Atrophic vaginitis is a condition in which the tissues that line the vagina become dry and thin. This condition is most common in women who have stopped having regular menstrual periods (are in menopause). This usually starts when a woman is 51-51 years old. That is the time when a woman's estrogen levels begin to drop (decrease). Estrogen is a female hormone. It helps to keep the tissues of the vagina moist. It stimulates the vagina to produce a clear fluid that lubricates the vagina for sexual intercourse. This fluid also protects the vagina from infection. Lack of estrogen can cause the lining of the vagina to get thinner and dryer. The vagina may also shrink in size. It may become less elastic. Atrophic vaginitis tends to get worse over time as a woman's estrogen level drops. What are the causes? This condition is caused by the normal drop in estrogen that happens around the time of menopause. What increases the risk? Certain conditions or situations may lower a woman's estrogen level, leading to a higher risk for atrophic vaginitis. You are more likely to develop this condition if:  You are taking medicines that block estrogen.  You have had your ovaries removed.  You are being treated for cancer with X-ray (radiation) or medicines (chemotherapy).  You have given birth or are breastfeeding.  You are older than age 33.  You smoke. What are the signs or symptoms? Symptoms of this condition include:  Pain, soreness, or bleeding during sexual intercourse  (dyspareunia).  Vaginal burning, irritation, or itching.  Pain or bleeding when a speculum is used in a vaginal exam (pelvic exam).  Having burning pain when passing urine.  Vaginal discharge that is brown or yellow. In some cases, there are no symptoms. How is this diagnosed? This condition is diagnosed by taking a medical history and doing a physical exam. This will include a pelvic exam that checks the vaginal tissues. Though rare, you may also have other tests, including:  A urine test.  A test that checks the acid balance in your vagina (acid balance test). How is this treated? Treatment for this condition depends on how severe your symptoms are. Treatment may include:  Using an over-the-counter vaginal lubricant before sex.  Using a long-acting vaginal moisturizer. REPLENS.  Using low-dose vaginal estrogen for moderate to severe symptoms that do not respond to other treatments. Options include creams, tablets, and inserts (vaginal rings). Before you use a vaginal estrogen, tell your health care provider if you have a history of: ? Breast cancer. ? Endometrial cancer. ? Blood clots. If you are not sexually active and your symptoms are very mild, you may not need treatment. Follow these instructions at home: Medicines  Take over-the-counter and prescription medicines only as told by your health care provider. Do not use herbal or alternative medicines unless your health care provider says that you can.  Use over-the-counter creams, lubricants, or moisturizers for dryness only as directed by your health care provider. General instructions  If your atrophic vaginitis is caused by menopause, discuss all of your menopause symptoms  and treatment options with your health care provider.  Do not douche.  Do not use products that can make your vagina dry. These include: ? Scented feminine sprays. ? Scented tampons. ? Scented soaps.  Vaginal intercourse can help to improve  blood flow and elasticity of vaginal tissue. If it hurts to have sex, try using a lubricant or moisturizer just before having intercourse. Contact a health care provider if:  Your discharge looks different than normal.  Your vagina has an unusual smell.  You have new symptoms.  Your symptoms do not improve with treatment.  Your symptoms get worse. Summary  Atrophic vaginitis is a condition in which the tissues that line the vagina become dry and thin. It is most common in women who have stopped having regular menstrual periods (are in menopause).  Treatment options include using vaginal lubricants and low-dose vaginal estrogen.  Contact a health care provider if your vagina has an unusual smell, or if your symptoms get worse or do not improve after treatment. This information is not intended to replace advice given to you by your health care provider. Make sure you discuss any questions you have with your health care provider. Document Revised: 12/08/2016 Document Reviewed: 09/21/2016 Elsevier Patient Education  2020 [employer].

## 2019-09-19 NOTE — Progress Notes (Signed)
HPI:      [name] is a 49 y.o. No obstetric history on file. who LMP was in the past, she presents today for her annual examination.  The patient has no complaints today. The patient is sexually active and c/o vaginal dryness and discomfort.   Herlast pap: approximate date 2019 and was normal and last mammogram: unsure, is due.   Prior CIN I of [place] cervix 2018.  The patient does perform self breast exams.  There is notable family history of breast or ovarian cancer in her family. The patient is not taking hormone replacement therapy. Patient denies post-menopausal vaginal bleeding.   The patient has regular exercise: yes. The patient denies current symptoms of depression.    GYN Hx: Last Colonoscopy:1 year ago. Normal.  Last DEXA: never ago.    PMHx: Past Medical History:  Diagnosis Date   BRCA negative 11/2016   MyRisk neg, except BRCA 1 VUS; IBIS=5.2%   Chronic kidney disease    Family history of ovarian cancer    Urinary (tract) obstruction    Past Surgical History:  Procedure Laterality Date   ABDOMINAL HYSTERECTOMY     APPENDECTOMY     BACK SURGERY     CHOLECYSTECTOMY     COLONOSCOPY WITH PROPOFOL N/A 06/26/2018   Procedure: COLONOSCOPY WITH PROPOFOL;  Surgeon: Toledo, Benay Pike, MD;  Location: [facility];  Service: Gastroenterology;  Laterality: N/A;  NO INTERPRETER NECESSARY   ESOPHAGOGASTRODUODENOSCOPY (EGD) WITH PROPOFOL N/A 06/26/2018   Procedure: ESOPHAGOGASTRODUODENOSCOPY (EGD) WITH PROPOFOL;  Surgeon: Toledo, Benay Pike, MD;  Location: [facility];  Service: Gastroenterology;  Laterality: N/A;  NO INTERPRETER NECESSARY   PERIPHERAL VASCULAR CATHETERIZATION  10/02/2014   Procedure: PICC Line Insertion;  Surgeon: Algernon Huxley, MD;  Location: [place] [facility];  Service: Cardiovascular;;   TONSILLECTOMY Bilateral    Family History  Problem Relation Age of Onset   Heart Problems Brother    Prostate cancer Brother 68       pat 1/2 brother    Ovarian cancer Sister 45       pat 1/2 sister   Cancer Sister 10       pat 1/2 sister--hepatobiliary   Kidney cancer Maternal Uncle 55   Social History   Tobacco Use   Smoking status: Never Smoker   Smokeless tobacco: Never Used  Scientific laboratory technician Use: Never used  Substance Use Topics   Alcohol use: No   Drug use: No    Current Outpatient Medications:    ciprofloxacin (CIPRO) 500 MG tablet, Take 500 mg by mouth 2 (two) times daily., Disp: , Rfl:    acetaminophen (TYLENOL) 325 MG tablet, Take 2 tablets (650 mg total) by mouth every 6 (six) hours as needed for mild pain, moderate pain, fever or headache (headache). (Patient not taking: Reported on 09/19/2019), Disp: , Rfl:    docusate sodium (COLACE) 100 MG capsule, Take 1 capsule (100 mg total) by mouth 2 (two) times daily. (Patient not taking: Reported on 09/19/2019), Disp: 10 capsule, Rfl: 0   doxycycline (VIBRA-TABS) 100 MG tablet, Take 1 tablet (100 mg total) by mouth 2 (two) times daily. (Patient not taking: Reported on 09/19/2019), Disp: 20 tablet, Rfl: 0   estrogens, conjugated, (PREMARIN) 0.3 MG tablet, Take 1 tablet (0.3 mg total) by mouth daily. Take daily for 21 days then do not take for 7 days. (Patient not taking: Reported on 09/19/2019), Disp: 90 tablet, Rfl: 3   mupirocin ointment (BACTROBAN)  2 %, Apply to affected area 3 times daily (Patient not taking: Reported on 09/19/2019), Disp: 22 g, Rfl: 0   pantoprazole (PROTONIX) 40 MG tablet, Take 1 tablet (40 mg total) by mouth 2 (two) times daily. (Patient not taking: Reported on 09/19/2019), Disp: 60 tablet, Rfl: 0   polyethylene glycol (MIRALAX / GLYCOLAX) 17 g packet, Take 17 g by mouth daily as needed for moderate constipation. (Patient not taking: Reported on 09/19/2019), Disp: 14 each, Rfl: 0   traMADol (ULTRAM) 50 MG tablet, Take 1 tablet (50 mg total) by mouth every 6 (six) hours as needed for moderate pain. (Patient not taking: Reported on 09/19/2019), Disp: 20  tablet, Rfl: 0   [START ON 09/22/2019] Vaginal Lubricant (REPLENS) GEL, Place 1 Applicatorful vaginally 2 (two) times a week., Disp: 35 g, Rfl: 11 Allergies: Sulfa antibiotics, Other, and Tape  Review of Systems  Constitutional: Positive for malaise/fatigue. Negative for chills and fever.  HENT: Negative for congestion, sinus pain and sore throat.   Eyes: Negative for blurred vision and pain.  Respiratory: Negative for cough and wheezing.   Cardiovascular: Negative for chest pain and leg swelling.  Gastrointestinal: Positive for constipation. Negative for abdominal pain, diarrhea, heartburn, nausea and vomiting.  Genitourinary: Negative for dysuria, frequency, hematuria and urgency.  Musculoskeletal: Positive for joint pain. Negative for back pain, myalgias and neck pain.  Skin: Negative for itching and rash.  Neurological: Negative for dizziness, tremors and weakness.  Endo/Heme/Allergies: Bruises/bleeds easily.  Psychiatric/Behavioral: Negative for depression. The patient is not nervous/anxious and does not have insomnia.     Objective: BP 100/60    Ht '5\' 8"'  (1.727 m)    Wt 169 lb (76.7 kg)    BMI 25.70 kg/m   Filed Weights   09/19/19 0851  Weight: 169 lb (76.7 kg)   Body mass index is 25.7 kg/m. Physical Exam Constitutional:      General: She is not in acute distress.    Appearance: She is well-developed.  Genitourinary:     Pelvic exam was performed with patient supine.     Vagina and rectum normal.     No lesions in the vagina.     No vaginal bleeding.     No cervical motion tenderness, friability, lesion or polyp.     Uterus is absent.     No right or left adnexal mass present.     Right adnexa not tender.     Left adnexa not tender.     Genitourinary Comments: Atrophy noted  HENT:     Head: Normocephalic and atraumatic. No laceration.     Right Ear: Hearing normal.     Left Ear: Hearing normal.     Mouth/Throat:     Pharynx: Uvula midline.  Eyes:     Pupils:  Pupils are equal, round, and reactive to light.  Neck:     Thyroid: No thyromegaly.  Cardiovascular:     Rate and Rhythm: Normal rate and regular rhythm.     Heart sounds: No murmur heard.  No friction rub. No gallop.   Pulmonary:     Effort: Pulmonary effort is normal. No respiratory distress.     Breath sounds: Normal breath sounds. No wheezing.  Chest:     Breasts:        Right: No mass, skin change or tenderness.        Left: No mass, skin change or tenderness.  Abdominal:     General: Bowel sounds are normal. There is  no distension.     Palpations: Abdomen is soft.     Tenderness: There is no abdominal tenderness. There is no rebound.  Musculoskeletal:        General: Normal range of motion.     Cervical back: Normal range of motion and neck supple.  Neurological:     Mental Status: She is alert and oriented to person, place, and time.     Cranial Nerves: No cranial nerve deficit.  Skin:    General: Skin is warm and dry.  Psychiatric:        Judgment: Judgment normal.  Vitals reviewed.     Assessment: Annual Exam 1. Women's annual routine gynecological examination   2. CIN I (cervical intraepithelial neoplasia I)   3. Encounter for screening mammogram for malignant neoplasm of breast   4. Atrophic vaginitis     Plan:            1.  Cervical Screening-  Pap smear done today  2. Breast screening- Exam annually and mammogram scheduled  3. Colonoscopy every 10 years, Hemoccult testing after age 64  4. Labs managed by PCP  5. Counseling for hormonal therapy: none              6. FRAX - FRAX score for assessing the 10 year probability for fracture calculated and discussed today.  Based on age and score today, DEXA is not currently scheduled.   7. Vaginal atrophy, plan Replens. Alternatives discussed as well.   8. Flu shot today     F/U  Return in about 2 months (around 11/19/2019) for Follow up tele, and Annual when due.  Barnett Applebaum, MD, [facility] P[facility]  Ob/Gyn, [facility] 09/19/2019  9:23 AM

## 2019-09-23 LAB — CYTOLOGY - PAP: Diagnosis: NEGATIVE

## 2019-10-01 ENCOUNTER — Ambulatory Visit: Payer: PRIVATE HEALTH INSURANCE | Admitting: Internal Medicine

## 2019-10-20 ENCOUNTER — Ambulatory Visit: Payer: PRIVATE HEALTH INSURANCE

## 2019-10-20 ENCOUNTER — Ambulatory Visit: Payer: PRIVATE HEALTH INSURANCE | Admitting: Dermatology

## 2019-10-30 ENCOUNTER — Ambulatory Visit (INDEPENDENT_AMBULATORY_CARE_PROVIDER_SITE_OTHER): Payer: Self-pay | Admitting: Dermatology

## 2019-10-30 ENCOUNTER — Other Ambulatory Visit: Payer: Self-pay

## 2019-10-30 ENCOUNTER — Telehealth: Payer: Self-pay

## 2019-10-30 DIAGNOSIS — N952 Postmenopausal atrophic vaginitis: Secondary | ICD-10-CM

## 2019-10-30 DIAGNOSIS — L988 Other specified disorders of the skin and subcutaneous tissue: Secondary | ICD-10-CM

## 2019-10-30 MED ORDER — REPLENS VA GEL: 1.0000 | VAGINAL | 11 refills | Status: DC

## 2019-10-30 NOTE — Progress Notes (Signed)
Follow-Up Visit   Subjective  Vickie Turner is a 49 y.o. female who presents for the following: Facial Elastosis (patient would like to discuss Botox and fillers as well as have them injected today ).  The following portions of the chart were reviewed this encounter and updated as appropriate:  Tobacco  Allergies  Meds  Problems  Med Hx  Surg Hx  Fam Hx     Review of Systems:  No other skin or systemic complaints except as noted in HPI or Assessment and Plan.  Objective  Well appearing patient in no apparent distress; mood and affect are within normal limits.  A focused examination was performed including the face. Relevant physical exam findings are noted in the Assessment and Plan.  Objective  Face: Rhytides and volume loss.   Images                              Assessment & Plan  Elastosis of skin Face  Discussed fillers, advised patient they typically last 6-12 months.  Discussed Botox, advised patient she would need to re-inject Botox every 3-4 months to see the best benefits.   Botox 45 units injected as marked: - frown complex 22.5 - forehead 7.5 - crow's feet 7.5 units each   Restylane Refyne 1 syringe injected into: - B/L nasolabial creases -B/L oral commisure (R>L) - anterior chin  Lot number: [id number] Expiration date: 08/08/2020  Botox Injection - Face Location: See attached image  Informed consent: Discussed risks (infection, pain, bleeding, bruising, swelling, allergic reaction, paralysis of nearby muscles, eyelid droop, double vision, neck weakness, difficulty breathing, headache, undesirable cosmetic result, and need for additional treatment) and benefits of the procedure, as well as the alternatives.  Informed consent was obtained.  Preparation: The area was cleansed with alcohol.  Procedure Details:  Botox was injected into the dermis with a 30-gauge needle. Pressure applied to any bleeding. Ice packs offered for  swelling.  Lot Number:  [id number] Expiration:  01/2022  Total Units Injected:  45  Plan: Patient was instructed to remain upright for 4 hours. Patient was instructed to avoid massaging the face and avoid vigorous exercise for the rest of the day. Tylenol may be used for headache.  Allow 2 weeks before returning to clinic for additional dosing as needed. Patient will call for any problems.   Filling material injection - Face Prior to the procedure, the patient's past medical history, allergies and the rare but potential risks and complications were reviewed with the patient and a signed consent was obtained. Pre and post-treatment care was discussed and instructions provided.  Location: perioral; oral commissures, nasolabial folds, and anterior chin crease  Filler Type: Restylane refyne  Procedure: The area was prepped thoroughly with hibiclens The area was prepped thoroughly with Puracyn. After introducing the needle into the desired treatment area, the syringe plunger was drawn back to ensure there was no flash of blood prior to injecting the filler in order to minimize risk of intravascular injection and vascular occlusion. After injection of the filler, the treated areas were cleansed and iced to reduce swelling. Post-treatment instructions were reviewed with the patient.       Patient tolerated the procedure well. The patient will call with any problems, questions or concerns prior to their next appointment.   Return in about 4 weeks (around 11/27/2019) for cosmetic - Botox follow up .  Maylene Roes, CMA, am acting as  scribe for Sarina Ser, MD .  Documentation: I have reviewed the above documentation for accuracy and completeness, and I agree with the above.  Sarina Ser, MD

## 2019-10-30 NOTE — Telephone Encounter (Signed)
Pt calling; pharm did not receive rx from appt a month ago; would like it sent to [facility] [address]  [phone]  Pt aware eRx'd.

## 2019-11-03 ENCOUNTER — Ambulatory Visit: Payer: PRIVATE HEALTH INSURANCE

## 2019-11-04 ENCOUNTER — Encounter: Payer: Self-pay | Admitting: Dermatology

## 2019-11-07 ENCOUNTER — Ambulatory Visit
Admission: RE | Admit: 2019-11-07 | Payer: No Typology Code available for payment source | Source: Home / Self Care | Admitting: *Deleted

## 2019-11-07 ENCOUNTER — Other Ambulatory Visit: Payer: Self-pay

## 2019-11-07 ENCOUNTER — Other Ambulatory Visit: Payer: Self-pay | Admitting: Rheumatology

## 2019-11-11 ENCOUNTER — Ambulatory Visit
Admission: RE | Admit: 2019-11-11 | Discharge: 2019-11-11 | Disposition: A | Discharge status: Home / Self Care | Payer: No Typology Code available for payment source | Source: Ambulatory Visit | Attending: Rheumatology | Admitting: Rheumatology

## 2019-11-11 ENCOUNTER — Other Ambulatory Visit: Payer: Self-pay

## 2019-11-11 ENCOUNTER — Ambulatory Visit
Admission: RE | Admit: 2019-11-11 | Discharge: 2019-11-11 | Disposition: A | Discharge status: Home / Self Care | Payer: No Typology Code available for payment source | Attending: Rheumatology | Admitting: Rheumatology

## 2019-11-11 ENCOUNTER — Other Ambulatory Visit: Payer: Self-pay | Admitting: Rheumatology

## 2019-11-11 DIAGNOSIS — M47819 Spondylosis without myelopathy or radiculopathy, site unspecified: Secondary | ICD-10-CM | POA: Diagnosis present

## 2019-11-11 DIAGNOSIS — R52 Pain, unspecified: Secondary | ICD-10-CM

## 2019-11-16 LAB — RHEUMATOID FACTOR: Rheumatoid fact SerPl-aCnc: <10 IU/mL (ref 0.0–13.9)

## 2019-11-16 LAB — TSH

## 2019-11-16 LAB — T4, FREE: Free T4: 0.99 ng/dL (ref 0.82–1.77)

## 2019-11-16 LAB — HLA-B27 ANTIGEN: HLA B27: NEGATIVE

## 2019-11-16 LAB — ANA: Anti Nuclear Antibody (ANA): NEGATIVE

## 2019-11-16 LAB — HIGH SENSITIVITY CRP: CRP, High Sensitivity: 1.71 mg/L (ref 0.00–3.00)

## 2019-11-16 LAB — ANTI-CCP AB, IGG + IGA (RDL): Anti-CCP Ab, IgG + IgA (RDL): <20 Units (ref <20)

## 2019-11-16 LAB — SEDIMENTATION RATE: Sed Rate: 10 mm/hr (ref 0–40)

## 2019-11-16 LAB — C-REACTIVE PROTEIN: CRP: 2 mg/L (ref 0–10)

## 2019-11-16 LAB — THYROID CASCADE PROFILE: TSH: 2.36 u[IU]/mL (ref 0.450–4.500)

## 2019-11-19 ENCOUNTER — Ambulatory Visit: Payer: No Typology Code available for payment source | Admitting: Obstetrics & Gynecology

## 2019-11-27 ENCOUNTER — Ambulatory Visit: Payer: No Typology Code available for payment source | Admitting: Dermatology

## 2019-12-11 ENCOUNTER — Ambulatory Visit: Payer: No Typology Code available for payment source | Admitting: Dermatology

## 2019-12-22 ENCOUNTER — Ambulatory Visit: Payer: No Typology Code available for payment source

## 2019-12-22 ENCOUNTER — Ambulatory Visit: Payer: PRIVATE HEALTH INSURANCE

## 2019-12-23 ENCOUNTER — Telehealth: Payer: Self-pay

## 2019-12-23 NOTE — Telephone Encounter (Signed)
-----   Message from Nadara Mustard, MD sent at 12/22/2019 11:18 AM EST ----- Regarding: MMG Received notice she has not received MMG yet as ordered at her Annual. Please check and encourage her to do this, and document conversation.

## 2019-12-23 NOTE — Telephone Encounter (Signed)
Left message to advise pt to schedule her mammogram 

## 2020-01-15 ENCOUNTER — Other Ambulatory Visit: Payer: Self-pay | Admitting: Internal Medicine

## 2020-01-17 LAB — MICROSCOPIC EXAMINATION: Casts: NONE SEEN /lpf

## 2020-01-17 LAB — URINALYSIS, ROUTINE W REFLEX MICROSCOPIC
Bilirubin, UA: NEGATIVE
Glucose, UA: NEGATIVE
Nitrite, UA: POSITIVE — AB
Specific Gravity, UA: 1.022 (ref 1.005–1.030)
Urobilinogen, Ur: 0.2 mg/dL (ref 0.2–1.0)
pH, UA: 6 (ref 5.0–7.5)

## 2020-01-17 LAB — URINE CULTURE

## 2020-02-25 ENCOUNTER — Ambulatory Visit: Payer: No Typology Code available for payment source | Admitting: Dermatology

## 2020-04-05 ENCOUNTER — Other Ambulatory Visit: Payer: Self-pay | Admitting: Obstetrics & Gynecology

## 2020-04-05 DIAGNOSIS — Z1231 Encounter for screening mammogram for malignant neoplasm of breast: Secondary | ICD-10-CM

## 2020-05-11 ENCOUNTER — Ambulatory Visit: Payer: No Typology Code available for payment source | Admitting: Obstetrics and Gynecology

## 2020-05-11 ENCOUNTER — Ambulatory Visit: Payer: No Typology Code available for payment source | Admitting: Obstetrics

## 2020-05-11 NOTE — Progress Notes (Signed)
Patient, No Pcp Per (Inactive)   Chief Complaint  Patient presents with  . Vaginal Dryness    Itchiness x couple of days, also having rectal bleeding    HPI:      [name] is a 50 y.o. No obstetric history on file. whose LMP was No LMP recorded. Patient has had a hysterectomy., presents today for vaginal itching without increase d/c for a few days. Has vaginal dryness that is worse. No meds to treat. Currently on abx for chronic UTIs, seeing ID MD.  Has dyspareunia, worse recently. Was given vaginal ERT crm due to atrophy but pt never started. ID MD told pt to start using around urethra.   Also with rectal bleeding with wiping, with and without BM. BMs are painful. Long hx of chronic constipation, relieved only with laxative use. Colonoscopy 6/20 with Dr. Alice Turner. Pt is s/p LSH (has cx), no vaginal bleeding.   Prior CIN I of Vickie Turner cervix 2018, neg pap 9/21 with Dr. Kenton Turner   Past Medical History:  Diagnosis Date  . BRCA negative 11/2016   MyRisk neg, except BRCA 1 VUS; IBIS=5.2%  . Chronic kidney disease   . Family history of ovarian cancer   . Urinary (tract) obstruction     Past Surgical History:  Procedure Laterality Date  . ABDOMINAL HYSTERECTOMY    . APPENDECTOMY    . BACK SURGERY    . CHOLECYSTECTOMY    . COLONOSCOPY WITH PROPOFOL N/A 06/26/2018   Procedure: COLONOSCOPY WITH PROPOFOL;  Surgeon: Toledo, Benay Pike, MD;  Location: [facility];  Service: Gastroenterology;  Laterality: N/A;  NO INTERPRETER NECESSARY  . ESOPHAGOGASTRODUODENOSCOPY (EGD) WITH PROPOFOL N/A 06/26/2018   Procedure: ESOPHAGOGASTRODUODENOSCOPY (EGD) WITH PROPOFOL;  Surgeon: Toledo, Benay Pike, MD;  Location: [facility];  Service: Gastroenterology;  Laterality: N/A;  NO INTERPRETER NECESSARY  . PERIPHERAL VASCULAR CATHETERIZATION  10/02/2014   Procedure: PICC Line Insertion;  Surgeon: Algernon Huxley, MD;  Location: [facility];  Service: Cardiovascular;;  . TONSILLECTOMY Bilateral      Family History  Problem Relation Age of Onset  . Heart Problems Brother   . Prostate cancer Brother 20       pat 1/2 brother  . Ovarian cancer Sister 55       pat 1/2 sister  . Cancer Sister 37       pat 1/2 sister--hepatobiliary  . Kidney cancer Maternal Uncle 44    Social History   Socioeconomic History  . Marital status: Married    Spouse name: Not on file  . Number of children: Not on file  . Years of education: Not on file  . Highest education level: Not on file  Occupational History  . Not on file  Tobacco Use  . Smoking status: Never Smoker  . Smokeless tobacco: Never Used  Vaping Use  . Vaping Use: Never used  Substance and Sexual Activity  . Alcohol use: No  . Drug use: No  . Sexual activity: Yes    Birth control/protection: Surgical    Comment: Hysterectomy  Other Topics Concern  . Not on file  Social History Narrative  . Not on file   Social Determinants of Health   Financial Resource Strain: Not on file  Food Insecurity: Not on file  Transportation Needs: Not on file  Physical Activity: Not on file  Stress: Not on file  Social Connections: Not on file  Intimate Partner Violence: Not on file    Outpatient  Medications Prior to Visit  Medication Sig Dispense Refill  . acetaminophen (TYLENOL) 325 MG tablet Take 2 tablets (650 mg total) by mouth every 6 (six) hours as needed for mild pain, moderate pain, fever or headache (headache).    . ciprofloxacin (CIPRO) 500 MG tablet Take 500 mg by mouth 2 (two) times daily.    . ergocalciferol (VITAMIN D2) 1.25 MG (50000 UT) capsule Take by mouth.    . estradiol (ESTRACE) 0.1 MG/GM vaginal cream Place vaginally.    . pantoprazole (PROTONIX) 40 MG tablet Take 1 tablet (40 mg total) by mouth 2 (two) times daily. 60 tablet 0  . Cephalexin 250 MG tablet Take by mouth. (Patient not taking: Reported on 05/12/2020)    . docusate sodium (COLACE) 100 MG capsule Take 1 capsule (100 mg total) by mouth 2 (two) times  daily. (Patient not taking: Reported on 09/19/2019) 10 capsule 0  . doxycycline (VIBRA-TABS) 100 MG tablet Take 1 tablet (100 mg total) by mouth 2 (two) times daily. (Patient not taking: Reported on 09/19/2019) 20 tablet 0  . mupirocin ointment (BACTROBAN) 2 % Apply to affected area 3 times daily (Patient not taking: Reported on 09/19/2019) 22 g 0  . polyethylene glycol (MIRALAX / GLYCOLAX) 17 g packet Take 17 g by mouth daily as needed for moderate constipation. (Patient not taking: Reported on 09/19/2019) 14 each 0  . traMADol (ULTRAM) 50 MG tablet Take 1 tablet (50 mg total) by mouth every 6 (six) hours as needed for moderate pain. (Patient not taking: Reported on 09/19/2019) 20 tablet 0  . Vaginal Lubricant (REPLENS) GEL Place 1 Applicatorful vaginally 2 (two) times a week. 35 g 11   No facility-administered medications prior to visit.      ROS:  Review of Systems  Constitutional: Negative for fever.  Gastrointestinal: Positive for anal bleeding and rectal pain. Negative for blood in stool, constipation, diarrhea, nausea and vomiting.  Genitourinary: Positive for vaginal pain. Negative for dyspareunia, dysuria, flank pain, frequency, hematuria, urgency, vaginal bleeding and vaginal discharge.  Musculoskeletal: Negative for back pain.  Skin: Negative for rash.    OBJECTIVE:   Vitals:  BP 120/70   Ht '5\' 8"'  (1.727 m)   Wt 178 lb (80.7 kg)   BMI 27.06 kg/m   Physical Exam Vitals reviewed.  Constitutional:      Appearance: She is well-developed.  Pulmonary:     Effort: Pulmonary effort is normal.  Genitourinary:    General: Normal vulva.     Pubic Area: No rash.      Labia:        Right: No rash, tenderness or lesion.        Left: No rash, tenderness or lesion.      Vagina: Normal. No vaginal discharge, erythema or tenderness.     Cervix: Normal.     Adnexa: Right adnexa normal and left adnexa normal.       Right: No mass or tenderness.         Left: No mass or tenderness.        Rectum: Anal fissure present.       Comments: ATROPHIC VULVA AND VAGINA; NO INCREASED D/C; NO ERYTHEMA Musculoskeletal:        General: Normal range of motion.     Cervical back: Normal range of motion.  Skin:    General: Skin is warm and dry.  Neurological:     General: No focal deficit present.     Mental Status: She is  alert and oriented to person, place, and time.  Psychiatric:        Mood and Affect: Mood normal.        Behavior: Behavior normal.        Thought Content: Thought content normal.        Judgment: Judgment normal.     Results: Results for orders placed or performed in visit on 05/12/20 (from the past 24 hour(s))  POCT Wet Prep with KOH     Status: Normal   Collection Time: 05/12/20 11:42 AM  Result Value Ref Range   Trichomonas, UA Negative    Clue Cells Wet Prep HPF POC neg    Epithelial Wet Prep HPF POC     Yeast Wet Prep HPF POC neg    Bacteria Wet Prep HPF POC     RBC Wet Prep HPF POC     WBC Wet Prep HPF POC     KOH Prep POC Negative Negative     Assessment/Plan: Anal fissure - Plan: hydrocortisone-pramoxine (ANALPRAM-HC) 2.5-1 % rectal cream; pos sx and exam. Rx analpram ext, sitz baths. Keep stools soft. F/u with GI if sx persist.   Vaginal itching - Plan: fluconazole (DIFLUCAN) 150 MG tablet; neg exam for yeast, pos exam for atrophy, neg wet prep. Treat empirically with diflucan since on abx and sx worse recently. Add vag ERT.   Postmenopausal atrophic vaginitis--use current estradiol vag ERT Rx, 1 g nightly internally for 1 wk, then once wkly as maintenance. Cont ext use around urethra. F/u prn.    Meds ordered this encounter  Medications  . fluconazole (DIFLUCAN) 150 MG tablet    Sig: Take 1 tablet (150 mg total) by mouth once for 1 dose.    Dispense:  1 tablet    Refill:  0    Order Specific Question:   Supervising Provider    Answer:   Gae Dry [id number]  . hydrocortisone-pramoxine (ANALPRAM-HC) 2.5-1 % rectal cream     Sig: Place rectally 2 (two) times daily as needed for up to 14 days for hemorrhoids.    Dispense:  30 g    Refill:  0    Order Specific Question:   Supervising Provider    Answer:   Gae Dry [[id number]]      Return if symptoms worsen or fail to improve.  Nile Prisk B. Janita Camberos, PA-C 05/12/2020 11:42 AM

## 2020-05-11 NOTE — Progress Notes (Deleted)
Patient, No Pcp Per (Inactive)   No chief complaint on file.   HPI:      [name] is a 50 y.o. No obstetric history on file. whose LMP was No LMP recorded. Patient has had a hysterectomy., presents today for ***  Hx of recent abx use  Past Medical History:  Diagnosis Date  . BRCA negative 11/2016   MyRisk neg, except BRCA 1 VUS; IBIS=5.2%  . Chronic kidney disease   . Family history of ovarian cancer   . Urinary (tract) obstruction     Past Surgical History:  Procedure Laterality Date  . ABDOMINAL HYSTERECTOMY    . APPENDECTOMY    . BACK SURGERY    . CHOLECYSTECTOMY    . COLONOSCOPY WITH PROPOFOL N/A 06/26/2018   Procedure: COLONOSCOPY WITH PROPOFOL;  Surgeon: Toledo, Benay Pike, MD;  Location: [facility];  Service: Gastroenterology;  Laterality: N/A;  NO INTERPRETER NECESSARY  . ESOPHAGOGASTRODUODENOSCOPY (EGD) WITH PROPOFOL N/A 06/26/2018   Procedure: ESOPHAGOGASTRODUODENOSCOPY (EGD) WITH PROPOFOL;  Surgeon: Toledo, Benay Pike, MD;  Location: [facility];  Service: Gastroenterology;  Laterality: N/A;  NO INTERPRETER NECESSARY  . PERIPHERAL VASCULAR CATHETERIZATION  10/02/2014   Procedure: PICC Line Insertion;  Surgeon: Algernon Huxley, MD;  Location: [facility];  Service: Cardiovascular;;  . TONSILLECTOMY Bilateral     Family History  Problem Relation Age of Onset  . Heart Problems Brother   . Prostate cancer Brother 31       pat 1/2 brother  . Ovarian cancer Sister 30       pat 1/2 sister  . Cancer Sister 2       pat 1/2 sister--hepatobiliary  . Kidney cancer Maternal Uncle 22    Social History   Socioeconomic History  . Marital status: Married    Spouse name: Not on file  . Number of children: Not on file  . Years of education: Not on file  . Highest education level: Not on file  Occupational History  . Not on file  Tobacco Use  . Smoking status: Never Smoker  . Smokeless tobacco: Never Used  Vaping Use  . Vaping Use: Never used   Substance and Sexual Activity  . Alcohol use: No  . Drug use: No  . Sexual activity: Not on file  Other Topics Concern  . Not on file  Social History Narrative  . Not on file   Social Determinants of Health   Financial Resource Strain: Not on file  Food Insecurity: Not on file  Transportation Needs: Not on file  Physical Activity: Not on file  Stress: Not on file  Social Connections: Not on file  Intimate Partner Violence: Not on file    Outpatient Medications Prior to Visit  Medication Sig Dispense Refill  . acetaminophen (TYLENOL) 325 MG tablet Take 2 tablets (650 mg total) by mouth every 6 (six) hours as needed for mild pain, moderate pain, fever or headache (headache). (Patient not taking: Reported on 09/19/2019)    . ciprofloxacin (CIPRO) 500 MG tablet Take 500 mg by mouth 2 (two) times daily.    Marland Kitchen docusate sodium (COLACE) 100 MG capsule Take 1 capsule (100 mg total) by mouth 2 (two) times daily. (Patient not taking: Reported on 09/19/2019) 10 capsule 0  . doxycycline (VIBRA-TABS) 100 MG tablet Take 1 tablet (100 mg total) by mouth 2 (two) times daily. (Patient not taking: Reported on 09/19/2019) 20 tablet 0  . mupirocin ointment (BACTROBAN) 2 %  Apply to affected area 3 times daily (Patient not taking: Reported on 09/19/2019) 22 g 0  . pantoprazole (PROTONIX) 40 MG tablet Take 1 tablet (40 mg total) by mouth 2 (two) times daily. (Patient not taking: Reported on 09/19/2019) 60 tablet 0  . polyethylene glycol (MIRALAX / GLYCOLAX) 17 g packet Take 17 g by mouth daily as needed for moderate constipation. (Patient not taking: Reported on 09/19/2019) 14 each 0  . traMADol (ULTRAM) 50 MG tablet Take 1 tablet (50 mg total) by mouth every 6 (six) hours as needed for moderate pain. (Patient not taking: Reported on 09/19/2019) 20 tablet 0  . Vaginal Lubricant (REPLENS) GEL Place 1 Applicatorful vaginally 2 (two) times a week. 35 g 11   No facility-administered medications prior to visit.       ROS:  Review of Systems BREAST: No symptoms   OBJECTIVE:   Vitals:  There were no vitals taken for this visit.  Physical Exam  Results: No results found for this or any previous visit (from the past 24 hour(s)).   Assessment/Plan: No diagnosis found.    No orders of the defined types were placed in this encounter.     No follow-ups on file.  Jerrion Tabbert B. Jarom Govan, PA-C 05/11/2020 11:45 AM

## 2020-05-12 ENCOUNTER — Encounter: Payer: Self-pay | Admitting: Obstetrics and Gynecology

## 2020-05-12 ENCOUNTER — Ambulatory Visit (INDEPENDENT_AMBULATORY_CARE_PROVIDER_SITE_OTHER): Payer: No Typology Code available for payment source | Admitting: Obstetrics and Gynecology

## 2020-05-12 ENCOUNTER — Other Ambulatory Visit: Payer: Self-pay

## 2020-05-12 VITALS — BP 120/70 | Ht 68.0 in | Wt 178.0 lb

## 2020-05-12 DIAGNOSIS — N952 Postmenopausal atrophic vaginitis: Secondary | ICD-10-CM | POA: Insufficient documentation

## 2020-05-12 DIAGNOSIS — N898 Other specified noninflammatory disorders of vagina: Secondary | ICD-10-CM | POA: Diagnosis not present

## 2020-05-12 DIAGNOSIS — K602 Anal fissure, unspecified: Secondary | ICD-10-CM

## 2020-05-12 LAB — POCT WET PREP WITH KOH
Clue Cells Wet Prep HPF POC: NEGATIVE
KOH Prep POC: NEGATIVE
Trichomonas, UA: NEGATIVE
Yeast Wet Prep HPF POC: NEGATIVE

## 2020-05-12 MED ORDER — FLUCONAZOLE 150 MG PO TABS: 150.0000 mg | ORAL_TABLET | Freq: Once | ORAL | 0 refills | Status: AC

## 2020-05-12 MED ORDER — HYDROCORT-PRAMOXINE (PERIANAL) 2.5-1 % EX CREA: TOPICAL_CREAM | Freq: Two times a day (BID) | CUTANEOUS | 0 refills | Status: AC | PRN

## 2020-05-12 NOTE — Patient Instructions (Signed)
I value your feedback and you entrusting us with your care. If you get a [facility] patient survey, I would appreciate you taking the time to let us know about your experience today. Thank you! ? ? ?

## 2020-06-11 ENCOUNTER — Other Ambulatory Visit: Payer: Self-pay | Admitting: Internal Medicine

## 2020-06-12 LAB — NOVEL CORONAVIRUS, NAA: SARS-CoV-2, NAA: NOT DETECTED

## 2020-06-12 LAB — SARS-COV-2, NAA 2 DAY TAT

## 2020-06-13 ENCOUNTER — Other Ambulatory Visit: Payer: Self-pay

## 2020-06-13 ENCOUNTER — Emergency Department: Payer: No Typology Code available for payment source

## 2020-06-13 ENCOUNTER — Emergency Department
Admission: EM | Admit: 2020-06-13 | Discharge: 2020-06-14 | Disposition: A | Discharge status: Home / Self Care | Payer: No Typology Code available for payment source | Attending: Emergency Medicine | Admitting: Emergency Medicine

## 2020-06-13 ENCOUNTER — Encounter: Payer: Self-pay | Admitting: Radiology

## 2020-06-13 DIAGNOSIS — R1030 Lower abdominal pain, unspecified: Secondary | ICD-10-CM | POA: Insufficient documentation

## 2020-06-13 DIAGNOSIS — N189 Chronic kidney disease, unspecified: Secondary | ICD-10-CM | POA: Insufficient documentation

## 2020-06-13 DIAGNOSIS — R070 Pain in throat: Secondary | ICD-10-CM | POA: Diagnosis present

## 2020-06-13 DIAGNOSIS — U071 COVID-19: Secondary | ICD-10-CM | POA: Insufficient documentation

## 2020-06-13 DIAGNOSIS — H9203 Otalgia, bilateral: Secondary | ICD-10-CM | POA: Insufficient documentation

## 2020-06-13 LAB — CBC WITH DIFFERENTIAL/PLATELET
Abs Immature Granulocytes: 0.02 10*3/uL (ref 0.00–0.07)
Basophils Absolute: 0 10*3/uL (ref 0.0–0.1)
Basophils Relative: 0 %
Eosinophils Absolute: 0 10*3/uL (ref 0.0–0.5)
Eosinophils Relative: 0 %
HCT: 31.8 % — ABNORMAL LOW (ref 36.0–46.0)
Hemoglobin: 11 g/dL — ABNORMAL LOW (ref 12.0–15.0)
Immature Granulocytes: 0 %
Lymphocytes Relative: 15 %
Lymphs Abs: 0.9 10*3/uL (ref 0.7–4.0)
MCH: 26.3 pg (ref 26.0–34.0)
MCHC: 34.6 g/dL (ref 30.0–36.0)
MCV: 76.1 fL — ABNORMAL LOW (ref 80.0–100.0)
Monocytes Absolute: 0.3 10*3/uL (ref 0.1–1.0)
Monocytes Relative: 4 %
Neutro Abs: 5 10*3/uL (ref 1.7–7.7)
Neutrophils Relative %: 81 %
Platelets: 189 10*3/uL (ref 150–400)
RBC: 4.18 MIL/uL (ref 3.87–5.11)
RDW: 13.7 % (ref 11.5–15.5)
WBC: 6.2 10*3/uL (ref 4.0–10.5)
nRBC: 0 % (ref 0.0–0.2)

## 2020-06-13 LAB — COMPREHENSIVE METABOLIC PANEL
ALT: 19 U/L (ref 0–44)
AST: 28 U/L (ref 15–41)
Albumin: 4.3 g/dL (ref 3.5–5.0)
Alkaline Phosphatase: 112 U/L (ref 38–126)
Anion gap: 11 (ref 5–15)
BUN: 11 mg/dL (ref 6–20)
CO2: 26 mmol/L (ref 22–32)
Calcium: 9.1 mg/dL (ref 8.9–10.3)
Chloride: 101 mmol/L (ref 98–111)
Creatinine, Ser: 0.77 mg/dL (ref 0.44–1.00)
GFR, Estimated: >60 mL/min (ref >60)
Glucose, Bld: 109 mg/dL — ABNORMAL HIGH (ref 70–99)
Potassium: 2.8 mmol/L — ABNORMAL LOW (ref 3.5–5.1)
Sodium: 138 mmol/L (ref 135–145)
Total Bilirubin: 0.8 mg/dL (ref 0.3–1.2)
Total Protein: 7.3 g/dL (ref 6.5–8.1)

## 2020-06-13 LAB — POC SARS CORONAVIRUS 2 AG -  ED: SARSCOV2ONAVIRUS 2 AG: POSITIVE — AB

## 2020-06-13 MED ORDER — POTASSIUM CHLORIDE CRYS ER 20 MEQ PO TBCR
40.0000 meq | EXTENDED_RELEASE_TABLET | Freq: Once | ORAL | Status: AC
  Administered 2020-06-14: 40 meq via ORAL
  Filled 2020-06-13: qty 2

## 2020-06-13 MED ORDER — ACETAMINOPHEN 500 MG PO TABS
1000.0000 mg | ORAL_TABLET | Freq: Once | ORAL | Status: AC
  Administered 2020-06-14: 1000 mg via ORAL
  Filled 2020-06-13: qty 2

## 2020-06-13 MED ORDER — KETOROLAC TROMETHAMINE 60 MG/2ML IM SOLN: 15.0000 mg | Freq: Once | INTRAMUSCULAR | Status: DC

## 2020-06-13 MED ORDER — KETOROLAC TROMETHAMINE 30 MG/ML IJ SOLN
15.0000 mg | Freq: Once | INTRAMUSCULAR | Status: DC
  Filled 2020-06-13: qty 1

## 2020-06-13 NOTE — ED Triage Notes (Signed)
Pt states has been treated for a uti but has not been taking her antibiotic. Pt states she is also having chills, body aches, throat pain, ear pain. Pt states members of her household are covid positive at this time.

## 2020-06-13 NOTE — ED Notes (Signed)
Spoke with dr. Scotty Court regarding pt's symptoms and complain, md  toplace orders.

## 2020-06-14 ENCOUNTER — Emergency Department: Payer: No Typology Code available for payment source

## 2020-06-14 LAB — URINALYSIS, COMPLETE (UACMP) WITH MICROSCOPIC
Bacteria, UA: NONE SEEN
Bilirubin Urine: NEGATIVE
Glucose, UA: NEGATIVE mg/dL
Ketones, ur: NEGATIVE mg/dL
Leukocytes,Ua: NEGATIVE
Nitrite: NEGATIVE
Protein, ur: NEGATIVE mg/dL
Specific Gravity, Urine: 1.005 (ref 1.005–1.030)
WBC, UA: NONE SEEN WBC/hpf (ref 0–5)
pH: 9 — ABNORMAL HIGH (ref 5.0–8.0)

## 2020-06-14 MED ORDER — ONDANSETRON 4 MG PO TBDP: 4.0000 mg | ORAL_TABLET | Freq: Three times a day (TID) | ORAL | 0 refills | Status: DC | PRN

## 2020-06-14 MED ORDER — NIRMATRELVIR/RITONAVIR (PAXLOVID)TABLET
3.0000 | ORAL_TABLET | Freq: Two times a day (BID) | ORAL | Status: DC
  Administered 2020-06-14: 3 via ORAL
  Filled 2020-06-14 (×2): qty 30

## 2020-06-14 MED ORDER — KETOROLAC TROMETHAMINE 60 MG/2ML IM SOLN
60.0000 mg | Freq: Once | INTRAMUSCULAR | Status: AC
  Administered 2020-06-14: 60 mg via INTRAMUSCULAR
  Filled 2020-06-14: qty 2

## 2020-06-14 NOTE — ED Notes (Signed)
ED Provider at bedside. 

## 2020-06-14 NOTE — Discharge Instructions (Signed)
To help boost your immune system against COVID-19, please take:  - Vitamin D3 4,000 IU/day - Vitamin C 500-1,000?mg twice a day - Quercetin 250?mg twice a day - Zinc 100?mg/day - Melatonin 10?mg before bedtime (causes drowsiness) - Aspirin 325?mg/day (unless contraindicated) - Pulse Oximeter Monitoring of oxygen saturation is recommended - check your oxygen 3 times a day if less than 90% return to the ER - Take Paxlovid twice a day for 5 days as prescribed  These medications are all over-the-counter and do not need a prescription.  QUARANTINE INSTRUCTION  Follow these instructions at home:  Protecting others To avoid spreading the illness to other people: Quarantine in your home until you have had no cough and fever for 7 days. Household members should also be quarantine for at least 14 days after being exposed to you. Wash your hands often with soap and water. If soap and water are not available, use an alcohol-based hand sanitizer. If you have not cleaned your hands, do not touch your face. Make sure that all people in your household wash their hands well and often. Cover your nose and mouth when you cough or sneeze. Throw away used tissues. Stay home if you have any cold-like or flu-like symptoms. General instructions Go to your local pharmacy and buy a pulse oximeter (this is a machine that measures your oxygen). Check your oxygen levels at least 3 times a day. If your oxygen level is 90% or less return to the emergency room immediately Take over-the-counter and prescription medicines only as told by your health care provider. If you need medication for fever take tylenol or ibuprofen Drink enough fluid to keep your urine pale yellow. Rest at home as directed by your health care provider. Do not give aspirin to a child with the flu, because of the association with Reye's syndrome. Do not use tobacco products, including cigarettes, chewing tobacco, and e-cigarettes. If you need  help quitting, ask your health care provider. Keep all follow-up visits as told by your health care provider. This is important. How is this prevented? Avoid areas where an outbreak has been reported. Avoid large groups of people. Keep a safe distance from people who are coughing and sneezing. Do not touch your face if you have not cleaned your hands. When you are around people who are sick or might be sick, wear a mask to protect yourself. Contact a health care provider if: You have symptoms of SARS (cough, fever, chest pain, shortness of breath) that are not getting better at home. You have a fever. If you have difficulty breathing go to your local ER or call 911

## 2020-06-14 NOTE — ED Provider Notes (Signed)
[facility] Emergency Department Provider Note  ____________________________________________  Time seen: Approximately 12:40 AM  I have reviewed the triage vital signs and the nursing notes.   HISTORY  Chief Complaint Generalized Body Aches   HPI Vickie Turner is a 50 y.o. female with a history of chronic kidney disease and chronic UTIs on prophylactic antibiotics who presents for evaluation of COVID-like symptoms.  Patient reports that all of her household members have tested positive for COVID.  She reports 2 days of throat pain, ear pain, body aches, chills.  This evening she started having severe lower cramping abdominal pain radiating to bilateral flanks.  She is concerned that she might have a UTI on top of that.  She denies chest pain or shortness of breath.  She denies vomiting or diarrhea.   Past Medical History:  Diagnosis Date  . BRCA negative 11/2016   MyRisk neg, except BRCA 1 VUS; IBIS=5.2%  . Chronic kidney disease   . Family history of ovarian cancer   . Urinary (tract) obstruction     Patient Active Problem List   Diagnosis Date Noted  . Postmenopausal atrophic vaginitis 05/12/2020  . Anal fissure 05/12/2020  . Colitis 06/10/2018  . Family history of ovarian cancer 12/27/2016  . CIN I (cervical intraepithelial neoplasia I) 07/24/2016  . Iron deficiency anemia 08/09/2015  . Easy bruising 08/09/2015  . SOB (shortness of breath) 11/14/2013  . Palpitations 11/14/2013    Past Surgical History:  Procedure Laterality Date  . ABDOMINAL HYSTERECTOMY    . APPENDECTOMY    . BACK SURGERY    . CHOLECYSTECTOMY    . COLONOSCOPY WITH PROPOFOL N/A 06/26/2018   Procedure: COLONOSCOPY WITH PROPOFOL;  Surgeon: Toledo, Benay Pike, MD;  Location: [facility] ENDOSCOPY;  Service: Gastroenterology;  Laterality: N/A;  NO INTERPRETER NECESSARY  . ESOPHAGOGASTRODUODENOSCOPY (EGD) WITH PROPOFOL N/A 06/26/2018   Procedure: ESOPHAGOGASTRODUODENOSCOPY (EGD) WITH  PROPOFOL;  Surgeon: Toledo, Benay Pike, MD;  Location: [facility] ENDOSCOPY;  Service: Gastroenterology;  Laterality: N/A;  NO INTERPRETER NECESSARY  . PERIPHERAL VASCULAR CATHETERIZATION  10/02/2014   Procedure: PICC Line Insertion;  Surgeon: Algernon Huxley, MD;  Location: [place] [facility];  Service: Cardiovascular;;  . TONSILLECTOMY Bilateral     Prior to Admission medications   Medication Sig Start Date End Date Taking? Authorizing Provider  ondansetron (ZOFRAN ODT) 4 MG disintegrating tablet Take 1 tablet (4 mg total) by mouth every 8 (eight) hours as needed. 06/14/20  Yes Alfred Levins, [place], MD  acetaminophen (TYLENOL) 325 MG tablet Take 2 tablets (650 mg total) by mouth every 6 (six) hours as needed for mild pain, moderate pain, fever or headache (headache). 06/14/18   Nicholes Mango, MD  Cephalexin 250 MG tablet Take by mouth. Patient not taking: Reported on 05/12/2020 02/04/20 02/03/21  [provider]  ciprofloxacin (CIPRO) 500 MG tablet Take 500 mg by mouth 2 (two) times daily. 08/15/19   [provider]  ergocalciferol (VITAMIN D2) 1.25 MG (50000 UT) capsule Take by mouth. 08/23/17   [provider]  estradiol (ESTRACE) 0.1 MG/GM vaginal cream Place vaginally. 01/21/20 01/20/21  [provider]  pantoprazole (PROTONIX) 40 MG tablet Take 1 tablet (40 mg total) by mouth 2 (two) times daily. 06/14/18   Nicholes Mango, MD    Allergies Sulfa antibiotics, Other, and Tape  Family History  Problem Relation Age of Onset  . Heart Problems Brother   . Prostate cancer Brother 46       pat 1/2 brother  .  Ovarian cancer Sister 33       pat 1/2 sister  . Cancer Sister 86       pat 1/2 sister--hepatobiliary  . Kidney cancer Maternal Uncle 57    Social History Social History   Tobacco Use  . Smoking status: Never Smoker  . Smokeless tobacco: Never Used  Vaping Use  . Vaping Use: Never used  Substance Use Topics  . Alcohol use: No  . Drug use: No    Review of  Systems  Constitutional: + fever, body aches, chills Eyes: Negative for visual changes. ENT: + sore throat. Neck: No neck pain  Cardiovascular: Negative for chest pain. Respiratory: Negative for shortness of breath. Gastrointestinal: + lower abdominal pain. No vomiting or diarrhea. Genitourinary: Negative for dysuria. Musculoskeletal: Negative for back pain. Skin: Negative for rash. Neurological: Negative for headaches, weakness or numbness. Psych: No SI or HI  ____________________________________________   PHYSICAL EXAM:  VITAL SIGNS: Vitals:   06/14/20 0020 06/14/20 0159  BP: (!) 153/86 (!) 161/91  Pulse: (!) 109 100  Resp: 18 20  Temp:    SpO2: 98% 100%    Constitutional: Alert and oriented, looks uncomfortable due to lower abd pain HEENT:      Head: Normocephalic and atraumatic.         Eyes: Conjunctivae are normal. Sclera is non-icteric.       Mouth/Throat: Mucous membranes are moist.       Neck: Supple with no signs of meningismus. Cardiovascular: Regular rate and rhythm. No murmurs, gallops, or rubs. 2+ symmetrical distal pulses are present in all extremities. No JVD. Respiratory: Normal respiratory effort. Lungs are clear to auscultation bilaterally.  Gastrointestinal: Soft, non tender, and non distended with positive bowel sounds. No rebound or guarding. Genitourinary: No CVA tenderness. Musculoskeletal:  No edema, cyanosis, or erythema of extremities. Neurologic: Normal speech and language. Face is symmetric. Moving all extremities. No gross focal neurologic deficits are appreciated. Skin: Skin is warm, dry and intact. No rash noted. Psychiatric: Mood and affect are normal. Speech and behavior are normal.  ____________________________________________   LABS (all labs ordered are listed, but only abnormal results are displayed)  Labs Reviewed  COMPREHENSIVE METABOLIC PANEL - Abnormal; Notable for the following components:      Result Value   Potassium  2.8 (*)    Glucose, Bld 109 (*)    All other components within normal limits  CBC WITH DIFFERENTIAL/PLATELET - Abnormal; Notable for the following components:   Hemoglobin 11.0 (*)    HCT 31.8 (*)    MCV 76.1 (*)    All other components within normal limits  URINALYSIS, COMPLETE (UACMP) WITH MICROSCOPIC - Abnormal; Notable for the following components:   Color, Urine STRAW (*)    APPearance CLEAR (*)    pH 9.0 (*)    Hgb urine dipstick SMALL (*)    All other components within normal limits  POC SARS CORONAVIRUS 2 AG -  ED - Abnormal; Notable for the following components:   SARSCOV2ONAVIRUS 2 AG POSITIVE (*)    All other components within normal limits   ____________________________________________  EKG  none  ____________________________________________  RADIOLOGY  I have personally reviewed the images performed during this visit and I agree with the Radiologist's read.   Interpretation by Radiologist:  CT ABDOMEN PELVIS WO CONTRAST  Result Date: 06/14/2020 CLINICAL DATA:  Right lower quadrant abdominal pain, urinary tract infection EXAM: CT ABDOMEN AND PELVIS WITHOUT CONTRAST TECHNIQUE: Multidetector CT imaging of the abdomen and  pelvis was performed following the standard protocol without IV contrast. COMPARISON:  06/13/2018 FINDINGS: Lower chest: Visualized lung bases are clear bilaterally. The visualized heart and pericardium are unremarkable. Hepatobiliary: No focal liver abnormality is seen. Status post cholecystectomy. No biliary dilatation. Pancreas: Unremarkable Spleen: Unremarkable Adrenals/Urinary Tract: The adrenal glands are unremarkable. The kidneys are normal in size and position. There is stable mild right hydronephrosis with asymmetric distension of the right renal pelvis suggestive of a mild right UPJ obstruction, unchanged from prior examination. No intrarenal or ureteral calculi are identified. No hydronephrosis on the left. No perinephric stranding or perinephric  fluid collections identified. The bladder is mildly distended. Stomach/Bowel: Surgical changes of Roux-en-Y gastric bypass and appendectomy are identified. The stomach, small bowel, and large bowel are otherwise unremarkable. No free intraperitoneal gas or fluid. Vascular/Lymphatic: Moderate aortoiliac atherosclerotic calcification. No aortic aneurysm. No pathologic adenopathy within the abdomen and pelvis. Reproductive: Status post hysterectomy. No adnexal masses. Other: No abdominal wall hernia.  Rectum unremarkable. Musculoskeletal: No lytic or blastic bone lesions are identified. The osseous structures are age-appropriate. No acute bone abnormality. IMPRESSION: No acute intra-abdominal pathology identified. No definite radiographic explanation for the patient's reported symptoms. Stable dilation of the proximal right renal collecting system suggestive of a mild right UPJ obstruction. Preserved right renal cortical thickness. Surgical changes of Roux-en-Y gastric bypass. No obstruction or leak. Status post appendectomy. Aortic Atherosclerosis (ICD10-I70.0). Electronically Signed   By: Fidela Salisbury MD   On: 06/14/2020 01:58   DG Chest Portable 1 View  Result Date: 06/14/2020 CLINICAL DATA:  COVID-19 EXAM: PORTABLE CHEST 1 VIEW COMPARISON:  None. FINDINGS: The heart size and mediastinal contours are within normal limits. Both lungs are clear. The visualized skeletal structures are unremarkable. IMPRESSION: No active disease. Electronically Signed   By: Ulyses Jarred M.D.   On: 06/14/2020 00:21     ____________________________________________   PROCEDURES  Procedure(s) performed: yes .1-3 Lead EKG Interpretation Performed by: Rudene Re, MD Authorized by: Rudene Re, MD     Interpretation: non-specific     ECG rate assessment: normal     Rhythm: sinus rhythm     Ectopy: none     Conduction: normal     Critical Care performed:   None ____________________________________________   INITIAL IMPRESSION / ASSESSMENT AND PLAN / ED COURSE  50 y.o. female with a history of chronic kidney disease and chronic UTIs on prophylactic antibiotics who presents for evaluation of COVID-like symptoms and also lower abd pain. + for Covid here with no CP, SOB, normal work of breathing, normal sats both at rest and with ambulation.  Chest x-ray visualized by me with no signs of COVID-pneumonia.  Labs showing no leukocytosis, mild stable anemia.  Mild hypokalemia which was supplemented p.o.  UA negative for UTI. Patient complaining of lower abdominal pain radiating to bilateral flanks.  CT done showing no acute findings.  Patient has mildly dilated right collecting system which is chronic with no signs of a kidney stone.  IM toradol and PO tylenol given for body aches. Paxlovid treatment initiated.  Discussed immune system boosting, oxygen monitoring, supportive care, quarantine, follow-up with PCP and my standard return precautions with patient and her husband who was at bedside.      _____________________________________________ Please note:  Patient was evaluated in Emergency Department today for the symptoms described in the history of present illness. Patient was evaluated in the context of the global COVID-19 pandemic, which necessitated consideration that the patient might be at risk for  infection with the SARS-CoV-2 virus that causes COVID-19. Institutional protocols and algorithms that pertain to the evaluation of patients at risk for COVID-19 are in a state of rapid change based on information released by regulatory bodies including the CDC and federal and state organizations. These policies and algorithms were followed during the patient's care in the ED.  Some ED evaluations and interventions may be delayed as a result of limited staffing during the pandemic.   [place] Controlled Substance Database was reviewed by  me. ____________________________________________   FINAL CLINICAL IMPRESSION(S) / ED DIAGNOSES   Final diagnoses:  COVID-19      NEW MEDICATIONS STARTED DURING THIS VISIT:  ED Discharge Orders         Ordered    ondansetron (ZOFRAN ODT) 4 MG disintegrating tablet  Every 8 hours PRN        06/14/20 0217           Note:  This document was prepared using Dragon voice recognition software and may include unintentional dictation errors.    Rudene Re, MD 06/14/20 [phone]

## 2020-06-28 ENCOUNTER — Other Ambulatory Visit: Payer: Self-pay

## 2020-06-28 ENCOUNTER — Encounter: Payer: Self-pay | Admitting: Emergency Medicine

## 2020-06-28 ENCOUNTER — Emergency Department
Admission: EM | Admit: 2020-06-28 | Discharge: 2020-06-28 | Disposition: A | Discharge status: Home / Self Care | Payer: No Typology Code available for payment source | Attending: Emergency Medicine | Admitting: Emergency Medicine

## 2020-06-28 DIAGNOSIS — S61216A Laceration without foreign body of right little finger without damage to nail, initial encounter: Secondary | ICD-10-CM | POA: Diagnosis not present

## 2020-06-28 DIAGNOSIS — W25XXXA Contact with sharp glass, initial encounter: Secondary | ICD-10-CM | POA: Insufficient documentation

## 2020-06-28 DIAGNOSIS — N189 Chronic kidney disease, unspecified: Secondary | ICD-10-CM | POA: Insufficient documentation

## 2020-06-28 DIAGNOSIS — Z23 Encounter for immunization: Secondary | ICD-10-CM | POA: Insufficient documentation

## 2020-06-28 DIAGNOSIS — Y93G1 Activity, food preparation and clean up: Secondary | ICD-10-CM | POA: Insufficient documentation

## 2020-06-28 DIAGNOSIS — S6991XA Unspecified injury of right wrist, hand and finger(s), initial encounter: Secondary | ICD-10-CM | POA: Diagnosis present

## 2020-06-28 MED ORDER — TETANUS-DIPHTH-ACELL PERTUSSIS 5-2.5-18.5 LF-MCG/0.5 IM SUSY
0.5000 mL | PREFILLED_SYRINGE | Freq: Once | INTRAMUSCULAR | Status: AC
  Administered 2020-06-28: 0.5 mL via INTRAMUSCULAR
  Filled 2020-06-28: qty 0.5

## 2020-06-28 MED ORDER — LIDOCAINE HCL (PF) 1 % IJ SOLN
2.0000 mL | Freq: Once | INTRAMUSCULAR | Status: AC
  Administered 2020-06-28: 2 mL
  Filled 2020-06-28: qty 5

## 2020-06-28 NOTE — ED Provider Notes (Signed)
[place] EMERGENCY DEPARTMENT Provider Note   CSN: [id number] Arrival date & time: 06/28/20  1333     History Chief Complaint  Patient presents with   Laceration    Vickie Turner is a 50 y.o. female presents to the emergency department for evaluation of right fifth digit laceration.  Patient was washing dishes, glass broke and she cut the right ring finger along the volar aspect of the middle phalanx.  She has no limitations in active range of motion.  Pain well controlled.  Bleeding controlled.  Tetanus status unknown.  HPI     Past Medical History:  Diagnosis Date   BRCA negative 11/2016   MyRisk neg, except BRCA 1 VUS; IBIS=5.2%   Chronic kidney disease    Family history of ovarian cancer    Urinary (tract) obstruction     Patient Active Problem List   Diagnosis Date Noted   Postmenopausal atrophic vaginitis 05/12/2020   Anal fissure 05/12/2020   Colitis 06/10/2018   Family history of ovarian cancer 12/27/2016   CIN I (cervical intraepithelial neoplasia I) 07/24/2016   Iron deficiency anemia 08/09/2015   Easy bruising 08/09/2015   SOB (shortness of breath) 11/14/2013   Palpitations 11/14/2013    Past Surgical History:  Procedure Laterality Date   ABDOMINAL HYSTERECTOMY     APPENDECTOMY     BACK SURGERY     CHOLECYSTECTOMY     COLONOSCOPY WITH PROPOFOL N/A 06/26/2018   Procedure: COLONOSCOPY WITH PROPOFOL;  Surgeon: Toledo, Benay Pike, MD;  Location: [facility] END[facility]COPY;  Service: Gastroenterology;  Laterality: N/A;  NO INTERPRETER NECESSARY   ESOPHAGOGASTRODUODENOSCOPY (EGD) WITH PROPOFOL N/A 06/26/2018   Procedure: ESOPHAGOGASTRODUODENOSCOPY (EGD) WITH PROPOFOL;  Surgeon: Toledo, Benay Pike, MD;  Location: [facility];  Service: Gastroenterology;  Laterality: N/A;  NO INTERPRETER NECESSARY   PERIPHERAL VASCULAR CATHETERIZATION  10/02/2014   Procedure: PICC Line Insertion;  Surgeon: Algernon Huxley, MD;  Location: [place] [facility];  Service:  Cardiovascular;;   TONSILLECTOMY Bilateral      OB History     Gravida  6   Para  4   Term  4   Preterm      AB  2   Living  4      SAB  2   IAB      Ectopic      Multiple      Live Births  4           Family History  Problem Relation Age of Onset   Heart Problems Brother    Prostate cancer Brother 32       pat 1/2 brother   Ovarian cancer Sister 33       pat 1/2 sister   Cancer Sister 57       pat 1/2 sister--hepatobiliary   Kidney cancer Maternal Uncle 64    Social History   Tobacco Use   Smoking status: Never   Smokeless tobacco: Never  Vaping Use   Vaping Use: Never used  Substance Use Topics   Alcohol use: No   Drug use: No    Home Medications Prior to Admission medications   Medication Sig Start Date End Date Taking? Authorizing Provider  acetaminophen (TYLENOL) 325 MG tablet Take 2 tablets (650 mg total) by mouth every 6 (six) hours as needed for mild pain, moderate pain, fever or headache (headache). 06/14/18   Nicholes Mango, MD  ciprofloxacin (CIPRO) 500 MG tablet Take 500 mg by mouth 2 (  two) times daily. 08/15/19   [provider]  ergocalciferol (VITAMIN D2) 1.25 MG (50000 UT) capsule Take by mouth. 08/23/17   [provider]  estradiol (ESTRACE) 0.1 MG/GM vaginal cream Place vaginally. 01/21/20 01/20/21  [provider]  ondansetron (ZOFRAN ODT) 4 MG disintegrating tablet Take 1 tablet (4 mg total) by mouth every 8 (eight) hours as needed. 06/14/20   Rudene Re, MD  pantoprazole (PROTONIX) 40 MG tablet Take 1 tablet (40 mg total) by mouth 2 (two) times daily. 06/14/18   Nicholes Mango, MD    Allergies    Sulfa antibiotics, Other, and Tape  Review of Systems   Review of Systems  Gastrointestinal:  Negative for nausea and vomiting.  Musculoskeletal:  Negative for arthralgias, joint swelling and myalgias.  Skin:  Positive for wound.   Physical Exam Updated Vital Signs BP 132/85 (BP Location: Left Arm)    Pulse 70   Temp 97.8 F (36.6 C) (Oral)   Resp 20   Ht '5\' 8"'  (1.727 m)   Wt 76.7 kg   SpO2 100%   BMI 25.70 kg/m   Physical Exam Constitutional:      Appearance: She is well-developed.  HENT:     Head: Normocephalic and atraumatic.  Eyes:     Conjunctiva/sclera: Conjunctivae normal.  Cardiovascular:     Rate and Rhythm: Normal rate.  Pulmonary:     Effort: Pulmonary effort is normal. No respiratory distress.  Musculoskeletal:        General: Normal range of motion.     Cervical back: Normal range of motion.     Comments: Examination of the right hand shows minimal bleeding along a volar fifth digit laceration along the middle phalanx.  She has full active flexion and extension.  No visible or palpable foreign body.  Nail is intact.  Sensation intact distally.  Wound thoroughly irrigated and repaired with 5 nylon sutures  Skin:    General: Skin is warm.     Findings: No rash.  Neurological:     General: No focal deficit present.     Mental Status: She is alert and oriented to person, place, and time. Mental status is at baseline.  Psychiatric:        Behavior: Behavior normal.        Thought Content: Thought content normal.    ED Results / Procedures / Treatments   Labs (all labs ordered are listed, but only abnormal results are displayed) Labs Reviewed - No data to display  EKG None  Radiology No results found.  Procedures .Marland KitchenLaceration Repair  Date/Time: 06/28/2020 5:01 PM Performed by: Duanne Guess, PA-C Authorized by: Duanne Guess, PA-C   Consent:    Consent obtained:  Verbal   Consent given by:  Patient   Alternatives discussed:  No treatment Universal protocol:    Patient identity confirmed:  Verbally with patient Anesthesia:    Anesthesia method:  Nerve block   Block needle gauge:  25 G   Block anesthetic:  Lidocaine 1% w/o epi   Block injection procedure:  Anatomic landmarks identified   Block outcome:  Anesthesia achieved Laceration  details:    Location:  Finger   Finger location:  R small finger   Length (cm):  2.5   Depth (mm):  3 Pre-procedure details:    Preparation:  Patient was prepped and draped in usual sterile fashion Exploration:    Wound exploration: wound explored through full range of motion and entire depth of wound  visualized   Treatment:    Area cleansed with:  Povidone-iodine and saline   Amount of cleaning:  Standard   Irrigation method:  Pressure wash   Visualized foreign bodies/material removed: no   Skin repair:    Repair method:  Sutures   Suture size:  4-0   Suture material:  Nylon   Suture technique:  Simple interrupted   Number of sutures:  5 Approximation:    Approximation:  Close Repair type:    Repair type:  Simple Post-procedure details:    Dressing:  Non-adherent dressing   Procedure completion:  Tolerated   Medications Ordered in ED Medications  lidocaine (PF) (XYLOCAINE) 1 % injection 2 mL (2 mLs Infiltration Given by Other 06/28/20 1612)  Tdap (BOOSTRIX) injection 0.5 mL (0.5 mLs Intramuscular Given 06/28/20 1613)    ED Course  I have reviewed the triage vital signs and the nursing notes.  Pertinent labs & imaging results that were available during my care of the patient were reviewed by me and considered in my medical decision making (see chart for details).    MDM Rules/Calculators/A&P                          50 year old female with right fifth digit laceration.  No injury to the flexor tendon.  Tetanus updated.  Wound thoroughly irrigated and cleansed with Betadine and saline and repaired with number five 5-0 nylon sutures.  She will follow-up 10 to 14 days for suture removal. Final Clinical Impression(s) / ED Diagnoses Final diagnoses:  Laceration of right little finger without foreign body without damage to nail, initial encounter    Rx / DC Orders ED Discharge Orders     None        Renata Caprice 06/28/20 1703    Duffy Bruce,  MD 07/01/20 1153

## 2020-06-28 NOTE — Discharge Instructions (Addendum)
Please keep laceration site clean.  You may shower and get wet do not submerge underwater.  Change dressing daily.  Follow-up with PCP, walk-in clinic, urgent care, Ortho office in 10 to 14 days for suture removal.  Return to the ER for any increasing pain swelling warmth or redness.

## 2020-06-28 NOTE — ED Triage Notes (Signed)
Pt comes into the ED via POV c/o right pinky laceration.  Pt in NAD at this time and bleeding is under control.

## 2020-09-28 ENCOUNTER — Other Ambulatory Visit: Payer: Self-pay | Admitting: Obstetrics & Gynecology

## 2020-09-30 ENCOUNTER — Encounter: Payer: Self-pay | Admitting: Oncology

## 2020-10-01 ENCOUNTER — Other Ambulatory Visit: Payer: Self-pay | Admitting: Obstetrics & Gynecology

## 2020-10-01 DIAGNOSIS — Z1231 Encounter for screening mammogram for malignant neoplasm of breast: Secondary | ICD-10-CM

## 2020-10-02 LAB — URINALYSIS, ROUTINE W REFLEX MICROSCOPIC
Bilirubin, UA: NEGATIVE
Glucose, UA: NEGATIVE
Ketones, UA: NEGATIVE
Leukocytes,UA: NEGATIVE
Nitrite, UA: NEGATIVE
Protein,UA: NEGATIVE
RBC, UA: NEGATIVE
Specific Gravity, UA: 1.014 (ref 1.005–1.030)
Urobilinogen, Ur: 0.2 mg/dL (ref 0.2–1.0)
pH, UA: 7 (ref 5.0–7.5)

## 2020-10-02 LAB — COMPREHENSIVE METABOLIC PANEL
ALT: 12 IU/L (ref 0–32)
AST: 20 IU/L (ref 0–40)
Albumin/Globulin Ratio: 1.8 (ref 1.2–2.2)
Albumin: 4 g/dL (ref 3.8–4.9)
Alkaline Phosphatase: 123 IU/L — ABNORMAL HIGH (ref 44–121)
BUN/Creatinine Ratio: 19 (ref 9–23)
BUN: 14 mg/dL (ref 6–24)
Bilirubin Total: 0.3 mg/dL (ref 0.0–1.2)
CO2: 24 mmol/L (ref 20–29)
Calcium: 8.9 mg/dL (ref 8.7–10.2)
Chloride: 102 mmol/L (ref 96–106)
Creatinine, Ser: 0.74 mg/dL (ref 0.57–1.00)
Globulin, Total: 2.2 g/dL (ref 1.5–4.5)
Glucose: 92 mg/dL (ref 65–99)
Potassium: 3.4 mmol/L — ABNORMAL LOW (ref 3.5–5.2)
Sodium: 140 mmol/L (ref 134–144)
Total Protein: 6.2 g/dL (ref 6.0–8.5)
eGFR: 97 mL/min/{1.73_m2} (ref >59)

## 2020-10-02 LAB — LIPID PANEL W/O CHOL/HDL RATIO
Cholesterol, Total: 184 mg/dL (ref 100–199)
HDL: 40 mg/dL (ref >39)
LDL Chol Calc (NIH): 123 mg/dL — ABNORMAL HIGH (ref 0–99)
Triglycerides: 114 mg/dL (ref 0–149)
VLDL Cholesterol Cal: 21 mg/dL (ref 5–40)

## 2020-10-02 LAB — HGB A1C W/O EAG: Hgb A1c MFr Bld: 5.3 % (ref 4.8–5.6)

## 2020-10-02 LAB — IRON AND TIBC
Iron Saturation: 13 % — ABNORMAL LOW (ref 15–55)
Iron: 52 ug/dL (ref 27–159)
Total Iron Binding Capacity: 414 ug/dL (ref 250–450)
UIBC: 362 ug/dL (ref 131–425)

## 2020-10-02 LAB — VITAMIN D 25 HYDROXY (VIT D DEFICIENCY, FRACTURES): Vit D, 25-Hydroxy: 23.6 ng/mL — ABNORMAL LOW (ref 30.0–100.0)

## 2020-10-02 LAB — FERRITIN: Ferritin: 10 ng/mL — ABNORMAL LOW (ref 15–150)

## 2020-10-02 LAB — URINE CULTURE

## 2020-10-02 LAB — B12 AND FOLATE PANEL
Folate: 10.4 ng/mL (ref >3.0)
Vitamin B-12: 336 pg/mL (ref 232–1245)

## 2020-10-02 LAB — WBC: WBC: 5.3 10*3/uL (ref 3.4–10.8)

## 2020-10-04 ENCOUNTER — Telehealth: Payer: Self-pay

## 2020-10-04 NOTE — Progress Notes (Signed)
Sch Annual

## 2020-10-04 NOTE — Telephone Encounter (Signed)
Called and left voicemail for patient to call back to be scheduled. 

## 2020-10-04 NOTE — Telephone Encounter (Signed)
-----   Message from Nadara Mustard, MD sent at 10/04/2020  1:35 PM EDT ----- Sch Annual

## 2021-03-30 ENCOUNTER — Other Ambulatory Visit: Payer: Self-pay | Admitting: Family Medicine

## 2021-04-02 LAB — CBC WITH DIFFERENTIAL/PLATELET
Basophils Absolute: 0 10*3/uL (ref 0.0–0.2)
Basos: 0 %
EOS (ABSOLUTE): 0.1 10*3/uL (ref 0.0–0.4)
Eos: 1 %
Hematocrit: 34.1 % (ref 34.0–46.6)
Hemoglobin: 11 g/dL — ABNORMAL LOW (ref 11.1–15.9)
Immature Grans (Abs): 0 10*3/uL (ref 0.0–0.1)
Immature Granulocytes: 0 %
Lymphocytes Absolute: 2.5 10*3/uL (ref 0.7–3.1)
Lymphs: 48 %
MCH: 25.2 pg — ABNORMAL LOW (ref 26.6–33.0)
MCHC: 32.3 g/dL (ref 31.5–35.7)
MCV: 78 fL — ABNORMAL LOW (ref 79–97)
Monocytes Absolute: 0.3 10*3/uL (ref 0.1–0.9)
Monocytes: 5 %
Neutrophils Absolute: 2.4 10*3/uL (ref 1.4–7.0)
Neutrophils: 46 %
Platelets: 242 10*3/uL (ref 150–450)
RBC: 4.37 x10E6/uL (ref 3.77–5.28)
RDW: 13.8 % (ref 11.7–15.4)
WBC: 5.3 10*3/uL (ref 3.4–10.8)

## 2021-04-02 LAB — IRON AND TIBC
Iron Saturation: 10 % — ABNORMAL LOW (ref 15–55)
Iron: 43 ug/dL (ref 27–159)
Total Iron Binding Capacity: 443 ug/dL (ref 250–450)
UIBC: 400 ug/dL (ref 131–425)

## 2021-04-02 LAB — COMPREHENSIVE METABOLIC PANEL
ALT: 12 IU/L (ref 0–32)
AST: 16 IU/L (ref 0–40)
Albumin/Globulin Ratio: 2 (ref 1.2–2.2)
Albumin: 4.4 g/dL (ref 3.8–4.9)
Alkaline Phosphatase: 151 IU/L — ABNORMAL HIGH (ref 44–121)
BUN/Creatinine Ratio: 21 (ref 9–23)
BUN: 16 mg/dL (ref 6–24)
Bilirubin Total: 0.2 mg/dL (ref 0.0–1.2)
CO2: 25 mmol/L (ref 20–29)
Calcium: 9.1 mg/dL (ref 8.7–10.2)
Chloride: 101 mmol/L (ref 96–106)
Creatinine, Ser: 0.75 mg/dL (ref 0.57–1.00)
Globulin, Total: 2.2 g/dL (ref 1.5–4.5)
Glucose: 86 mg/dL (ref 70–99)
Potassium: 3.2 mmol/L — ABNORMAL LOW (ref 3.5–5.2)
Sodium: 140 mmol/L (ref 134–144)
Total Protein: 6.6 g/dL (ref 6.0–8.5)
eGFR: 95 mL/min/{1.73_m2} (ref >59)

## 2021-04-02 LAB — URINALYSIS, ROUTINE W REFLEX MICROSCOPIC
Bilirubin, UA: NEGATIVE
Glucose, UA: NEGATIVE
Ketones, UA: NEGATIVE
Nitrite, UA: POSITIVE — AB
RBC, UA: NEGATIVE
Specific Gravity, UA: 1.02 (ref 1.005–1.030)
Urobilinogen, Ur: 1 mg/dL (ref 0.2–1.0)
pH, UA: 6 (ref 5.0–7.5)

## 2021-04-02 LAB — LIPID PANEL W/O CHOL/HDL RATIO
Cholesterol, Total: 227 mg/dL — ABNORMAL HIGH (ref 100–199)
HDL: 43 mg/dL (ref >39)
LDL Chol Calc (NIH): 161 mg/dL — ABNORMAL HIGH (ref 0–99)
Triglycerides: 128 mg/dL (ref 0–149)
VLDL Cholesterol Cal: 23 mg/dL (ref 5–40)

## 2021-04-02 LAB — TSH: TSH: 1.84 u[IU]/mL (ref 0.450–4.500)

## 2021-04-02 LAB — URINE CULTURE

## 2021-04-02 LAB — MICROSCOPIC EXAMINATION
Bacteria, UA: NONE SEEN
Casts: NONE SEEN /lpf
Epithelial Cells (non renal): NONE SEEN /hpf (ref 0–10)
RBC, Urine: NONE SEEN /hpf (ref 0–2)
WBC, UA: >30 /hpf — AB (ref 0–5)

## 2021-04-02 LAB — FERRITIN: Ferritin: 9 ng/mL — ABNORMAL LOW (ref 15–150)

## 2021-04-02 LAB — T4, FREE: Free T4: 1.18 ng/dL (ref 0.82–1.77)

## 2021-04-02 LAB — VITAMIN D 25 HYDROXY (VIT D DEFICIENCY, FRACTURES): Vit D, 25-Hydroxy: 11.5 ng/mL — ABNORMAL LOW (ref 30.0–100.0)

## 2021-05-10 ENCOUNTER — Other Ambulatory Visit: Payer: Self-pay | Admitting: Cardiovascular Disease

## 2021-05-10 ENCOUNTER — Encounter: Payer: Self-pay | Admitting: *Deleted

## 2021-05-10 ENCOUNTER — Encounter: Payer: Self-pay | Admitting: Oncology

## 2021-05-10 ENCOUNTER — Other Ambulatory Visit: Payer: Self-pay

## 2021-05-10 ENCOUNTER — Emergency Department
Admission: EM | Admit: 2021-05-10 | Discharge: 2021-05-10 | Disposition: A | Discharge status: Home / Self Care | Payer: PRIVATE HEALTH INSURANCE | Attending: Emergency Medicine | Admitting: Emergency Medicine

## 2021-05-10 DIAGNOSIS — E876 Hypokalemia: Secondary | ICD-10-CM | POA: Diagnosis not present

## 2021-05-10 DIAGNOSIS — R799 Abnormal finding of blood chemistry, unspecified: Secondary | ICD-10-CM | POA: Diagnosis present

## 2021-05-10 LAB — COMPREHENSIVE METABOLIC PANEL
ALT: 16 U/L (ref 0–44)
AST: 24 U/L (ref 15–41)
Albumin: 3.9 g/dL (ref 3.5–5.0)
Alkaline Phosphatase: 114 U/L (ref 38–126)
Anion gap: 8 (ref 5–15)
BUN: 15 mg/dL (ref 6–20)
CO2: 28 mmol/L (ref 22–32)
Calcium: 9 mg/dL (ref 8.9–10.3)
Chloride: 101 mmol/L (ref 98–111)
Creatinine, Ser: 0.67 mg/dL (ref 0.44–1.00)
GFR, Estimated: >60 mL/min (ref >60)
Glucose, Bld: 154 mg/dL — ABNORMAL HIGH (ref 70–99)
Potassium: 2.6 mmol/L — CL (ref 3.5–5.1)
Sodium: 137 mmol/L (ref 135–145)
Total Bilirubin: 0.5 mg/dL (ref 0.3–1.2)
Total Protein: 6.9 g/dL (ref 6.5–8.1)

## 2021-05-10 LAB — CBC
HCT: 34.3 % — ABNORMAL LOW (ref 36.0–46.0)
Hemoglobin: 10.8 g/dL — ABNORMAL LOW (ref 12.0–15.0)
MCH: 24.9 pg — ABNORMAL LOW (ref 26.0–34.0)
MCHC: 31.5 g/dL (ref 30.0–36.0)
MCV: 79.2 fL — ABNORMAL LOW (ref 80.0–100.0)
Platelets: 219 10*3/uL (ref 150–400)
RBC: 4.33 MIL/uL (ref 3.87–5.11)
RDW: 13.9 % (ref 11.5–15.5)
WBC: 7.6 10*3/uL (ref 4.0–10.5)
nRBC: 0 % (ref 0.0–0.2)

## 2021-05-10 LAB — TROPONIN I (HIGH SENSITIVITY): Troponin I (High Sensitivity): 4 ng/L (ref <18)

## 2021-05-10 MED ORDER — POTASSIUM CHLORIDE 20 MEQ PO PACK: 20.0000 meq | PACK | Freq: Every day | ORAL | 3 refills | Status: DC

## 2021-05-10 MED ORDER — POTASSIUM CHLORIDE CRYS ER 20 MEQ PO TBCR
40.0000 meq | EXTENDED_RELEASE_TABLET | Freq: Once | ORAL | Status: AC
Start: 2021-05-10 — End: 2021-05-10
  Administered 2021-05-10: 40 meq via ORAL
  Filled 2021-05-10: qty 2

## 2021-05-10 MED ORDER — POTASSIUM CHLORIDE CRYS ER 20 MEQ PO TBCR: 40.0000 meq | EXTENDED_RELEASE_TABLET | Freq: Once | ORAL | 0 refills | Status: DC

## 2021-05-10 NOTE — ED Triage Notes (Signed)
Pt sent from [facility] for low potassium.  No chest pain or sob.  Pt reports blisters in her mouth.  Pt also has weakness.  Pt alert  speech clear.  ?

## 2021-05-10 NOTE — ED Provider Notes (Signed)
? ?[facility] ?Provider Note ? ? ? Event Date/Time  ? First MD Initiated Contact with Patient 05/10/21 1934   ?  (approximate) ? ? ?History  ? ?Abnormal Lab ? ? ?HPI ? ?Vickie Turner is a 51 y.o. female who presents to the ED for evaluation of Abnormal Lab ?  ?I reviewed PCP visit from 1 year ago.  History of recurrent UTIs.  She tells me that she stays on chronic antibiotics.  Currently on ciprofloxacin. ? ?Reports developing generalized tremulousness in the past couple days, so she went to the walk-in clinic and I review this clinic visit that occurred just prior to her ED arrival.  Blood work showed hypokalemia to 2.9, and because of this she was sent to the ED for evaluation and repletion.  I reviewed urinalysis that shows small blood, no nitrites, no leukocytes.  Sent for culture.  Otherwise normal metabolic panel beyond hypokalemia and no leukocytosis. ? ?She also reports multiple small bumps within her mouth that she calls ulcers.  Have been present for about a week.  Denies any odynophagia, difficulty swallowing or breathing. ? ? ?Physical Exam  ? ?Triage Vital Signs: ?ED Triage Vitals [05/10/21 1921]  ?Enc Vitals Group  ?   BP (!) 129/8  ?   Pulse Rate 76  ?   Resp 20  ?   Temp 98.2 ?F (36.8 ?C)  ?   Temp Source Oral  ?   SpO2 100 %  ?   Weight 168 lb (76.2 kg)  ?   Height 5\' 8"  (1.727 m)  ?   Head Circumference   ?   Peak Flow   ?   Pain Score 9  ?   Pain Loc   ?   Pain Edu?   ?   Excl. in GC?   ? ? ?Most recent vital signs: ?Vitals:  ? 05/10/21 1921  ?BP: (!) 129/8  ?Pulse: 76  ?Resp: 20  ?Temp: 98.2 ?F (36.8 ?C)  ?SpO2: 100%  ? ? ?General: Awake, no distress.  ?CV:  Good peripheral perfusion.  ?Resp:  Normal effort.  ?Abd:  No distention.  ?MSK:  No deformity noted.  ?Neuro:  No focal deficits appreciated. Cranial nerves II through XII intact ?5/5 strength and sensation in all 4 extremities ?Other:  Couple tiny white papules within the mouth with no surrounding erythema or  swelling.  1 to the right side of the tongue, 1 to the hard palate and 1 to the right mucosal bucca.  Mildly tender to palpation. ? ? ?ED Results / Procedures / Treatments  ? ?Labs ?(all labs ordered are listed, but only abnormal results are displayed) ?Labs Reviewed  ?CBC  ?COMPREHENSIVE METABOLIC PANEL  ?TROPONIN I (HIGH SENSITIVITY)  ? ? ?EKG ?Sinus rhythm, rate of 71 bpm.  Normal axis and intervals.  No evidence of acute ischemia.  Tremulousness. ? ?RADIOLOGY ? ? ?Official radiology report(s): ?No results found. ? ?PROCEDURES and INTERVENTIONS: ? ?Procedures ? ?Medications  ?potassium chloride SA (KLOR-CON M) CR tablet 40 mEq (has no administration in time range)  ? ? ? ?IMPRESSION / MDM / ASSESSMENT AND PLAN / ED COURSE  ?I reviewed the triage vital signs and the nursing notes. ? ?51 year old female presents to the ED with hypokalemia suitable for outpatient management.  We will provide 1 tablet of potassium here and more to go home with.  EKG is nonischemic without concerning interval changes.  I reviewed blood work performed at clinic just  prior to her arrival.  No signs of systemic illness, cystitis, sepsis.  She was referred to urology from this clinic visit, which is appropriate.  We discussed continuing her ciprofloxacin and the clinic and call her once the urine culture results.  We discussed return precautions. ? ?  ? ? ?FINAL CLINICAL IMPRESSION(S) / ED DIAGNOSES  ? ?Final diagnoses:  ?Hypokalemia  ? ? ? ?Rx / DC Orders  ? ?ED Discharge Orders   ? ?      Ordered  ?  potassium chloride SA (KLOR-CON M) 20 MEQ tablet   Once       ? 05/10/21 1935  ? ?  ?  ? ?  ? ? ? ?Note:  This document was prepared using Dragon voice recognition software and may include unintentional dictation errors. ?  ?Delton Prairie, MD ?05/10/21 1939 ? ?

## 2021-05-10 NOTE — ED Notes (Signed)
Dr d Katrinka Blazing in triage 3 seeing pt now.   ?

## 2021-05-10 NOTE — ED Provider Triage Note (Signed)
Emergency Medicine Provider Triage Evaluation Note ? ?Vickie Turner , a 50 y.o. female  was evaluated in triage.  Pt complains of chest pain, body aches, low potassium.  Patient states that she went to [place] [facility], was told that her potassium was very low and she needed to come to the ED.  She is having chest pain but no shortness of breath.  No cardiac history.  Generalized pain but no "cramps.". ? ?Review of Systems  ?Positive: Low potassium, chest pain, body pain ?Negative: Fevers, chills, URI symptoms, shortness of breath, GI complaints, cramp ? ?Physical Exam  ?BP (!) 129/8 (BP Location: Left Arm)   Pulse 76   Temp 98.2 ?F (36.8 ?C) (Oral)   Resp 20   Ht 5\' 8"  (1.727 m)   Wt 76.2 kg   SpO2 100%   BMI 25.54 kg/m?  ?Gen:   Awake, no distress   ?Resp:  Normal effort  ?MSK:   Moves extremities without difficulty  ?Other:   ? ?Medical Decision Making  ?Medically screening exam initiated at 7:26 PM.  Appropriate orders placed.  Vickie Turner was informed that the remainder of the evaluation will be completed by another provider, this initial triage assessment does not replace that evaluation, and the importance of remaining in the ED until their evaluation is complete. ? ?Patient arrives with reported low potassium.  Will perform ekg and draw labs ?  ? , PA-C ?05/10/21 1926 ? ?

## 2021-05-10 NOTE — ED Notes (Signed)
Pt's son in law is rn and requesting powder form potassium.  Dr Katrinka Blazing aware and  states will leave meds rx as is  pt and family aware.   D/c inst to son in law and pt from triage.    ?

## 2021-05-31 ENCOUNTER — Other Ambulatory Visit: Payer: Self-pay | Admitting: Internal Medicine

## 2021-06-01 LAB — POTASSIUM: Potassium: 3.1 mmol/L — ABNORMAL LOW (ref 3.5–5.2)

## 2021-06-25 ENCOUNTER — Other Ambulatory Visit: Payer: Self-pay | Admitting: Internal Medicine

## 2021-06-25 DIAGNOSIS — N390 Urinary tract infection, site not specified: Secondary | ICD-10-CM

## 2021-06-25 MED ORDER — NITROFURANTOIN MONOHYD MACRO 100 MG PO CAPS: ORAL_CAPSULE | ORAL | 0 refills | Status: DC

## 2021-07-08 ENCOUNTER — Encounter: Payer: Self-pay | Admitting: Oncology

## 2021-07-19 ENCOUNTER — Other Ambulatory Visit: Payer: Self-pay | Admitting: Internal Medicine

## 2021-07-22 LAB — COMPREHENSIVE METABOLIC PANEL
ALT: 11 IU/L (ref 0–32)
AST: 18 IU/L (ref 0–40)
Albumin/Globulin Ratio: 1.9 (ref 1.2–2.2)
Albumin: 4.1 g/dL (ref 3.8–4.9)
Alkaline Phosphatase: 126 IU/L — ABNORMAL HIGH (ref 44–121)
BUN/Creatinine Ratio: 20 (ref 9–23)
BUN: 15 mg/dL (ref 6–24)
Bilirubin Total: 0.2 mg/dL (ref 0.0–1.2)
CO2: 24 mmol/L (ref 20–29)
Calcium: 9 mg/dL (ref 8.7–10.2)
Chloride: 102 mmol/L (ref 96–106)
Creatinine, Ser: 0.76 mg/dL (ref 0.57–1.00)
Globulin, Total: 2.2 g/dL (ref 1.5–4.5)
Glucose: 90 mg/dL (ref 70–99)
Potassium: 3.1 mmol/L — ABNORMAL LOW (ref 3.5–5.2)
Sodium: 142 mmol/L (ref 134–144)
Total Protein: 6.3 g/dL (ref 6.0–8.5)
eGFR: 94 mL/min/{1.73_m2} (ref >59)

## 2021-07-22 LAB — CBC WITH DIFFERENTIAL/PLATELET
Basophils Absolute: 0 10*3/uL (ref 0.0–0.2)
Basos: 1 %
EOS (ABSOLUTE): 0.1 10*3/uL (ref 0.0–0.4)
Eos: 2 %
Hematocrit: 30.9 % — ABNORMAL LOW (ref 34.0–46.6)
Hemoglobin: 10.1 g/dL — ABNORMAL LOW (ref 11.1–15.9)
Immature Grans (Abs): 0 10*3/uL (ref 0.0–0.1)
Immature Granulocytes: 0 %
Lymphocytes Absolute: 2.7 10*3/uL (ref 0.7–3.1)
Lymphs: 49 %
MCH: 24.9 pg — ABNORMAL LOW (ref 26.6–33.0)
MCHC: 32.7 g/dL (ref 31.5–35.7)
MCV: 76 fL — ABNORMAL LOW (ref 79–97)
Monocytes Absolute: 0.3 10*3/uL (ref 0.1–0.9)
Monocytes: 6 %
Neutrophils Absolute: 2.3 10*3/uL (ref 1.4–7.0)
Neutrophils: 42 %
Platelets: 252 10*3/uL (ref 150–450)
RBC: 4.06 x10E6/uL (ref 3.77–5.28)
RDW: 13.9 % (ref 11.7–15.4)
WBC: 5.4 10*3/uL (ref 3.4–10.8)

## 2021-07-22 LAB — URINALYSIS, ROUTINE W REFLEX MICROSCOPIC
Bilirubin, UA: NEGATIVE
Glucose, UA: NEGATIVE
Ketones, UA: NEGATIVE
Leukocytes,UA: NEGATIVE
Nitrite, UA: NEGATIVE
Protein,UA: NEGATIVE
RBC, UA: NEGATIVE
Specific Gravity, UA: 1.015 (ref 1.005–1.030)
Urobilinogen, Ur: 0.2 mg/dL (ref 0.2–1.0)
pH, UA: 6.5 (ref 5.0–7.5)

## 2021-07-22 LAB — LIPID PANEL W/O CHOL/HDL RATIO
Cholesterol, Total: 224 mg/dL — ABNORMAL HIGH (ref 100–199)
HDL: 40 mg/dL (ref >39)
LDL Chol Calc (NIH): 156 mg/dL — ABNORMAL HIGH (ref 0–99)
Triglycerides: 151 mg/dL — ABNORMAL HIGH (ref 0–149)
VLDL Cholesterol Cal: 28 mg/dL (ref 5–40)

## 2021-07-22 LAB — VITAMIN D 25 HYDROXY (VIT D DEFICIENCY, FRACTURES): Vit D, 25-Hydroxy: 8.1 ng/mL — ABNORMAL LOW (ref 30.0–100.0)

## 2021-07-22 LAB — CULTURE, URINE COMPREHENSIVE

## 2021-07-22 LAB — FERRITIN: Ferritin: 8 ng/mL — ABNORMAL LOW (ref 15–150)

## 2021-07-22 LAB — IRON AND TIBC
Iron Saturation: 9 % — CL (ref 15–55)
Iron: 41 ug/dL (ref 27–159)
Total Iron Binding Capacity: 432 ug/dL (ref 250–450)
UIBC: 391 ug/dL (ref 131–425)

## 2021-11-28 ENCOUNTER — Other Ambulatory Visit: Payer: Self-pay | Admitting: Family Medicine

## 2021-11-30 LAB — COMPREHENSIVE METABOLIC PANEL
ALT: 13 IU/L (ref 0–32)
AST: 18 IU/L (ref 0–40)
Albumin/Globulin Ratio: 1.6 (ref 1.2–2.2)
Albumin: 3.9 g/dL (ref 3.8–4.9)
Alkaline Phosphatase: 95 IU/L (ref 44–121)
BUN/Creatinine Ratio: 18 (ref 9–23)
BUN: 18 mg/dL (ref 6–24)
Bilirubin Total: 0.3 mg/dL (ref 0.0–1.2)
CO2: 26 mmol/L (ref 20–29)
Calcium: 8.9 mg/dL (ref 8.7–10.2)
Chloride: 102 mmol/L (ref 96–106)
Globulin, Total: 2.4 g/dL (ref 1.5–4.5)
Glucose: 89 mg/dL (ref 70–99)
Potassium: 3.5 mmol/L (ref 3.5–5.2)
Sodium: 141 mmol/L (ref 134–144)
Total Protein: 6.3 g/dL (ref 6.0–8.5)

## 2021-11-30 LAB — CBC WITH DIFFERENTIAL/PLATELET
Basophils Absolute: 0 10*3/uL (ref 0.0–0.2)
Basos: 0 %
EOS (ABSOLUTE): 0 10*3/uL (ref 0.0–0.4)
Eos: 0 %
Hematocrit: 33.1 % — ABNORMAL LOW (ref 34.0–46.6)
Hemoglobin: 10.7 g/dL — ABNORMAL LOW (ref 11.1–15.9)
Immature Grans (Abs): 0 10*3/uL (ref 0.0–0.1)
Immature Granulocytes: 0 %
Lymphocytes Absolute: 2.4 10*3/uL (ref 0.7–3.1)
Lymphs: 34 %
MCH: 25.5 pg — ABNORMAL LOW (ref 26.6–33.0)
MCHC: 32.3 g/dL (ref 31.5–35.7)
MCV: 79 fL (ref 79–97)
Monocytes Absolute: 0.4 10*3/uL (ref 0.1–0.9)
Monocytes: 6 %
Neutrophils Absolute: 4.3 10*3/uL (ref 1.4–7.0)
Neutrophils: 60 %
Platelets: 269 10*3/uL (ref 150–450)
RBC: 4.2 x10E6/uL (ref 3.77–5.28)
RDW: 16.4 % — ABNORMAL HIGH (ref 11.7–15.4)
WBC: 7.2 10*3/uL (ref 3.4–10.8)

## 2021-11-30 LAB — KIDNEY PROFILE
Creatinine, Ser: 0.99 mg/dL (ref 0.57–1.00)
Creatinine, Urine: 158.4 mg/dL
Microalb/Creat Ratio: 16 mg/g creat (ref 0–29)
Microalbumin, Urine: 25.4 ug/mL
eGFR: 68 mL/min/{1.73_m2} (ref >59)

## 2021-11-30 LAB — URINALYSIS, ROUTINE W REFLEX MICROSCOPIC
Bilirubin, UA: NEGATIVE
Glucose, UA: NEGATIVE
Ketones, UA: NEGATIVE
Leukocytes,UA: NEGATIVE
Nitrite, UA: NEGATIVE
RBC, UA: NEGATIVE
Specific Gravity, UA: 1.022 (ref 1.005–1.030)
Urobilinogen, Ur: 1 mg/dL (ref 0.2–1.0)
pH, UA: 6 (ref 5.0–7.5)

## 2021-11-30 LAB — IRON AND TIBC
Iron Saturation: 9 % — CL (ref 15–55)
Iron: 37 ug/dL (ref 27–159)
Total Iron Binding Capacity: 410 ug/dL (ref 250–450)
UIBC: 373 ug/dL (ref 131–425)

## 2021-11-30 LAB — LIPID PANEL W/O CHOL/HDL RATIO
Cholesterol, Total: 220 mg/dL — ABNORMAL HIGH (ref 100–199)
HDL: 52 mg/dL (ref >39)
LDL Chol Calc (NIH): 149 mg/dL — ABNORMAL HIGH (ref 0–99)
Triglycerides: 104 mg/dL (ref 0–149)
VLDL Cholesterol Cal: 19 mg/dL (ref 5–40)

## 2021-11-30 LAB — HGB A1C W/O EAG: Hgb A1c MFr Bld: 5.5 % (ref 4.8–5.6)

## 2021-11-30 LAB — CULTURE, URINE COMPREHENSIVE

## 2022-01-04 ENCOUNTER — Emergency Department: Payer: PRIVATE HEALTH INSURANCE

## 2022-01-04 ENCOUNTER — Emergency Department
Admission: EM | Admit: 2022-01-04 | Discharge: 2022-01-04 | Disposition: A | Discharge status: Home / Self Care | Payer: PRIVATE HEALTH INSURANCE | Attending: Student in an Organized Health Care Education/Training Program | Admitting: Student in an Organized Health Care Education/Training Program

## 2022-01-04 DIAGNOSIS — R051 Acute cough: Secondary | ICD-10-CM | POA: Insufficient documentation

## 2022-01-04 DIAGNOSIS — I10 Essential (primary) hypertension: Secondary | ICD-10-CM | POA: Insufficient documentation

## 2022-01-04 DIAGNOSIS — Z20822 Contact with and (suspected) exposure to covid-19: Secondary | ICD-10-CM | POA: Insufficient documentation

## 2022-01-04 DIAGNOSIS — R531 Weakness: Secondary | ICD-10-CM | POA: Insufficient documentation

## 2022-01-04 LAB — URINALYSIS, ROUTINE W REFLEX MICROSCOPIC
Bilirubin Urine: NEGATIVE
Glucose, UA: NEGATIVE mg/dL
Hgb urine dipstick: NEGATIVE
Ketones, ur: NEGATIVE mg/dL
Nitrite: NEGATIVE
Protein, ur: NEGATIVE mg/dL
Specific Gravity, Urine: 1.005 (ref 1.005–1.030)
pH: 7 (ref 5.0–8.0)

## 2022-01-04 LAB — RESP PANEL BY RT-PCR (RSV, FLU A&B, COVID)  RVPGX2
Influenza A by PCR: NEGATIVE
Influenza B by PCR: NEGATIVE
Resp Syncytial Virus by PCR: NEGATIVE
SARS Coronavirus 2 by RT PCR: NEGATIVE

## 2022-01-04 LAB — CBC
HCT: 34.3 % — ABNORMAL LOW (ref 36.0–46.0)
Hemoglobin: 10.7 g/dL — ABNORMAL LOW (ref 12.0–15.0)
MCH: 25.5 pg — ABNORMAL LOW (ref 26.0–34.0)
MCHC: 31.2 g/dL (ref 30.0–36.0)
MCV: 81.9 fL (ref 80.0–100.0)
Platelets: 247 10*3/uL (ref 150–400)
RBC: 4.19 MIL/uL (ref 3.87–5.11)
RDW: 14.8 % (ref 11.5–15.5)
WBC: 9.3 10*3/uL (ref 4.0–10.5)
nRBC: 0 % (ref 0.0–0.2)

## 2022-01-04 LAB — BASIC METABOLIC PANEL
Anion gap: 6 (ref 5–15)
BUN: 16 mg/dL (ref 6–20)
CO2: 28 mmol/L (ref 22–32)
Calcium: 9 mg/dL (ref 8.9–10.3)
Chloride: 109 mmol/L (ref 98–111)
Creatinine, Ser: 0.95 mg/dL (ref 0.44–1.00)
GFR, Estimated: >60 mL/min (ref >60)
Glucose, Bld: 82 mg/dL (ref 70–99)
Potassium: 3.4 mmol/L — ABNORMAL LOW (ref 3.5–5.1)
Sodium: 143 mmol/L (ref 135–145)

## 2022-01-04 MED ORDER — ONDANSETRON 4 MG PO TBDP
4.0000 mg | ORAL_TABLET | Freq: Once | ORAL | Status: AC
  Administered 2022-01-04: 4 mg via ORAL
  Filled 2022-01-04: qty 1

## 2022-01-04 NOTE — ED Triage Notes (Signed)
Pt presents to the ED via [facility] due to hypotension that worse overnight. Pt has a hx kidney infection an d on chroinic antibiotics. Pt has been feeling more weak, dizzy, and loss of appetite that increase through out the night. Pt denies V/D. Pt A&Ox4

## 2022-01-04 NOTE — ED Triage Notes (Signed)
First nurse NOte:  Arrives from home via [facility].  C/O weakness since last night. Hx kidney infection.  C/O low back pain.  HR:  60-98.  VS wnl. CBG:  103

## 2022-01-04 NOTE — ED Provider Notes (Signed)
[facility] Emergency Department Provider Note     Event Date/Time   First MD Initiated Contact with Patient 01/04/22 1615     (approximate)   History   Near Syncope   HPI  Courtni A Merida is a 51 y.o. female complains of hypotension, chronic bacteruria (on Cipro/macrobid), with complaints of faigue, malaise, and decreased appetite.  No reports of nausea, vomiting, or FCS.  She presents from home via EMS. She noted onset of symptoms last night, including chills and cough.      Physical Exam   Triage Vital Signs: ED Triage Vitals  Enc Vitals Group     BP 01/04/22 1324 121/66     Pulse Rate 01/04/22 1324 69     Resp 01/04/22 1324 18     Temp 01/04/22 1324 98.2 F (36.8 C)     Temp Source 01/04/22 1324 Oral     SpO2 01/04/22 1324 98 %     Weight 01/04/22 1325 171 lb (77.6 kg)     Height 01/04/22 1325 5\' 8"  (1.727 m)     Head Circumference --      Peak Flow --      Pain Score 01/04/22 1324 0     Pain Loc --      Pain Edu? --      Excl. in GC? --     Most recent vital signs: Vitals:   01/04/22 1324  BP: 121/66  Pulse: 69  Resp: 18  Temp: 98.2 F (36.8 C)  SpO2: 98%    General Awake, no distress.  NAD CV:  Good peripheral perfusion.  RESP:  Normal effort.  Intermittent cough noted ABD:  No distention.    ED Results / Procedures / Treatments   Labs (all labs ordered are listed, but only abnormal results are displayed) Labs Reviewed  BASIC METABOLIC PANEL - Abnormal; Notable for the following components:      Result Value   Potassium 3.4 (*)    All other components within normal limits  CBC - Abnormal; Notable for the following components:   Hemoglobin 10.7 (*)    HCT 34.3 (*)    MCH 25.5 (*)    All other components within normal limits  URINALYSIS, ROUTINE W REFLEX MICROSCOPIC - Abnormal; Notable for the following components:   Color, Urine YELLOW (*)    APPearance HAZY (*)    Leukocytes,Ua MODERATE (*)    Bacteria, UA  MANY (*)    All other components within normal limits  RESP PANEL BY RT-PCR (RSV, FLU A&B, COVID)  [id number]  URINE CULTURE     EKG  See EKG final report NSR Normal axis No STEMI  RADIOLOGY  I personally viewed and evaluated these images as part of my medical decision making, as well as reviewing the written report by the radiologist.  ED Provider Interpretation: No acute findings  DG Chest 2 View  Result Date: 01/04/2022 CLINICAL DATA:  Cough and malaise in a 51 year old female. EXAM: CHEST - 2 VIEW COMPARISON:  June 14, 2020 FINDINGS: The heart size and mediastinal contours are within normal limits. Both lungs are clear. The visualized skeletal structures are unremarkable. IMPRESSION: No active cardiopulmonary disease. Electronically Signed   By: June 16, 2020 M.D.   On: 01/04/2022 16:49     PROCEDURES:  Critical Care performed: No  Procedures   MEDICATIONS ORDERED IN ED: Medications  ondansetron (ZOFRAN-ODT) disintegrating tablet 4 mg (4 mg Oral Given 01/04/22 1438)  IMPRESSION / MDM / ASSESSMENT AND PLAN / ED COURSE  I reviewed the triage vital signs and the nursing notes.                              Differential diagnosis includes, but is not limited to, viral URI, UTI, CAP, electrolyte abnormality,   Patient's presentation is most consistent with acute complicated illness / injury requiring diagnostic workup.  Patient's diagnosis is consistent with weakness with a cough likely due to a viral etiology.  Patient was evaluated for complaints in the ED, pending reassuring exam and workup.  She was found to be normotensive on presentation without evidence of tachycardia, tachypnea, or hemodynamic instability.  Labs are reassuring at this time without signs of acute leukocytosis or critical anemia.  Urinalysis reveals the patient's chronic bacteriuria.  Urine culture is pending at this time.  No indication for admission or further imaging as patient exam is overall  reassuring.  Chest x-ray without any signs of any acute infectious process, based on my interpretation.  No EKG changes noted on my interpretation.  Patient with a reassuring exam and lab workup, stable for outpatient management with her urologist.  She will await urine culture results and be managed appropriately.  Patient will be discharged home with instructions to take OTC cough medicine as needed. Patient is to follow up with urology or her PCP as needed or otherwise directed. Patient is given ED precautions to return to the ED for any worsening or new symptoms.  Clinical Course as of 01/04/22 2343  Wed Jan 04, 2022  1531 Resp panel by RT-PCR (RSV, Flu A&B, Covid) Anterior Nasal Swab [JM]    Clinical Course User Index [JM] Jhordan Mckibben, Charlesetta Ivory, PA-C    FINAL CLINICAL IMPRESSION(S) / ED DIAGNOSES   Final diagnoses:  Weakness  Acute cough     Rx / DC Orders   ED Discharge Orders     None        Note:  This document was prepared using Dragon voice recognition software and may include unintentional dictation errors.    Lissa Hoard, PA-C 01/04/22 2344    Willy Eddy, MD 01/14/22 1059

## 2022-01-04 NOTE — Discharge Instructions (Signed)
Your exam, labs, chest x-ray, viral panel test are normal and reassuring this time.  Urine culture is pending, you should follow with your primary provider or urologist for medication management.  Continue to rest, hydrate, take previous medications as prescribed.  If symptoms continue you should consider a viral URI retesting.

## 2022-01-04 NOTE — ED Provider Triage Note (Signed)
Emergency Medicine Provider Triage Evaluation Note  Murna A Mcquaid, a 51 y.o. female  was evaluated in triage.  Pt complains of hypotension, chronic bacteruria (on Cipro/macrobid), with complaints of faigue, malaise, and decreased appetite. She presents from home via EMS. She noted onset of symptoms last night, including chills and cough.   Review of Systems  Positive: Fatigue, malaise Negative: NVD, dysuria  Physical Exam  BP 121/66 (BP Location: Left Arm)   Pulse 69   Temp 98.2 F (36.8 C) (Oral)   Resp 18   Ht 5\' 8"  (1.727 m)   Wt 77.6 kg   SpO2 98%   BMI 26.00 kg/m  Gen:   Awake, no distress  NAD Resp:  Normal effort  MSK:   Moves extremities without difficulty  Other:    Medical Decision Making  Medically screening exam initiated at 3:15 PM.  Appropriate orders placed.  Chessa A Aitken was informed that the remainder of the evaluation will be completed by another provider, this initial triage assessment does not replace that evaluation, and the importance of remaining in the ED until their evaluation is complete.  Patient to the ED for evaluation of fatigue, cough, and malaise.   , PA-C 01/04/22 1536

## 2022-01-07 LAB — URINE CULTURE: Culture: >=100000 — AB

## 2022-01-08 NOTE — Consult Note (Addendum)
ED Antimicrobial Stewardship Positive Culture Follow Up   Vickie Turner is an 51 y.o. female who presented to [facility] on 01/04/2022 with a chief complaint of  Chief Complaint  Patient presents with   Near Syncope    Recent Results (from the past 720 hour(s))  Resp panel by RT-PCR (RSV, Flu A&B, Covid) Anterior Nasal Swab     Status: None   Collection Time: 01/04/22  1:52 PM   Specimen: Anterior Nasal Swab  Result Value Ref Range Status   SARS Coronavirus 2 by RT PCR NEGATIVE NEGATIVE Final    Comment: (NOTE) SARS-CoV-2 target nucleic acids are NOT DETECTED.  The SARS-CoV-2 RNA is generally detectable in upper respiratory specimens during the acute phase of infection. The lowest concentration of SARS-CoV-2 viral copies this assay can detect is 138 copies/mL. A negative result does not preclude SARS-Cov-2 infection and should not be used as the sole basis for treatment or other patient management decisions. A negative result may occur with  improper specimen collection/handling, submission of specimen other than nasopharyngeal swab, presence of viral mutation(s) within the areas targeted by this assay, and inadequate number of viral copies(<138 copies/mL). A negative result must be combined with clinical observations, patient history, and epidemiological information. The expected result is Negative.  Fact Sheet for Patients:  BloggerCourse.com  Fact Sheet for Healthcare Providers:  SeriousBroker.it  This test is no t yet approved or cleared by the [place] FDA and  has been authorized for detection and/or diagnosis of SARS-CoV-2 by FDA under an Emergency Use Authorization (EUA). This EUA will remain  in effect (meaning this test can be used) for the duration of the COVID-19 declaration under Section 564(b)(1) of the Act, 21 U.S.C.section 360bbb-3(b)(1), unless the authorization is terminated  or revoked sooner.        Influenza A by PCR NEGATIVE NEGATIVE Final   Influenza B by PCR NEGATIVE NEGATIVE Final    Comment: (NOTE) The Xpert Xpress SARS-CoV-2/FLU/RSV plus assay is intended as an aid in the diagnosis of influenza from Nasopharyngeal swab specimens and should not be used as a sole basis for treatment. Nasal washings and aspirates are unacceptable for Xpert Xpress SARS-CoV-2/FLU/RSV testing.  Fact Sheet for Patients: BloggerCourse.com  Fact Sheet for Healthcare Providers: SeriousBroker.it  This test is not yet approved or cleared by the [place] FDA and has been authorized for detection and/or diagnosis of SARS-CoV-2 by FDA under an Emergency Use Authorization (EUA). This EUA will remain in effect (meaning this test can be used) for the duration of the COVID-19 declaration under Section 564(b)(1) of the Act, 21 U.S.C. section 360bbb-3(b)(1), unless the authorization is terminated or revoked.     Resp Syncytial Virus by PCR NEGATIVE NEGATIVE Final    Comment: (NOTE) Fact Sheet for Patients: BloggerCourse.com  Fact Sheet for Healthcare Providers: SeriousBroker.it  This test is not yet approved or cleared by the [place] FDA and has been authorized for detection and/or diagnosis of SARS-CoV-2 by FDA under an Emergency Use Authorization (EUA). This EUA will remain in effect (meaning this test can be used) for the duration of the COVID-19 declaration under Section 564(b)(1) of the Act, 21 U.S.C. section 360bbb-3(b)(1), unless the authorization is terminated or revoked.  Performed at [facility], [address] [place], [place] [address]   Urine Culture     Status: Abnormal   Collection Time: 01/04/22  1:52 PM   Specimen: Urine, Clean Catch  Result Value Ref Range Status  Specimen Description   Final    URINE, CLEAN CATCH Performed at [facility],  [address] [place], [place] [address]    Special Requests   Final    NONE Performed at [facility], [address] [place], [place] [address]    Culture (A)  Final    >=100,000 COLONIES/mL ESCHERICHIA COLI >=100,000 COLONIES/mL KLEBSIELLA PNEUMONIAE    Report Status 01/07/2022 FINAL  Final   Organism ID, Bacteria ESCHERICHIA COLI (A)  Final   Organism ID, Bacteria KLEBSIELLA PNEUMONIAE (A)  Final      Susceptibility   Escherichia coli - MIC*    AMPICILLIN <=2 SENSITIVE Sensitive     CEFAZOLIN <=4 SENSITIVE Sensitive     CEFEPIME <=0.12 SENSITIVE Sensitive     CEFTRIAXONE <=0.25 SENSITIVE Sensitive     CIPROFLOXACIN <=0.25 SENSITIVE Sensitive     GENTAMICIN <=1 SENSITIVE Sensitive     IMIPENEM <=0.25 SENSITIVE Sensitive     NITROFURANTOIN <=16 SENSITIVE Sensitive     TRIMETH/SULFA <=20 SENSITIVE Sensitive     AMPICILLIN/SULBACTAM <=2 SENSITIVE Sensitive     PIP/TAZO <=4 SENSITIVE Sensitive     * >=100,000 COLONIES/mL ESCHERICHIA COLI   Klebsiella pneumoniae - MIC*    AMPICILLIN RESISTANT Resistant     CEFAZOLIN <=4 SENSITIVE Sensitive     CEFEPIME <=0.12 SENSITIVE Sensitive     CEFTRIAXONE <=0.25 SENSITIVE Sensitive     CIPROFLOXACIN <=0.25 SENSITIVE Sensitive     GENTAMICIN <=1 SENSITIVE Sensitive     IMIPENEM <=0.25 SENSITIVE Sensitive     NITROFURANTOIN 64 INTERMEDIATE Intermediate     TRIMETH/SULFA <=20 SENSITIVE Sensitive     AMPICILLIN/SULBACTAM 4 SENSITIVE Sensitive     PIP/TAZO <=4 SENSITIVE Sensitive     * >=100,000 COLONIES/mL KLEBSIELLA PNEUMONIAE   New antibiotic prescription: None Patient with chronic bacteriuria, so growth is to be expected. Patient follows along with urology as outpatient. No treatment warranted at this time.  ED Provider: Dionne Bucy  Will M. Dareen Piano, PharmD PGY-1 Pharmacy Resident 01/08/2022 1:32 PM

## 2022-01-10 ENCOUNTER — Encounter: Payer: Self-pay | Admitting: Oncology

## 2022-01-12 ENCOUNTER — Other Ambulatory Visit: Payer: Self-pay | Admitting: Internal Medicine

## 2022-01-14 ENCOUNTER — Other Ambulatory Visit: Payer: Self-pay | Admitting: Internal Medicine

## 2022-01-14 ENCOUNTER — Ambulatory Visit: Payer: Self-pay | Admitting: Internal Medicine

## 2022-01-14 VITALS — BP 105/58 | HR 99 | Temp 99.4°F | Ht 68.0 in | Wt 174.0 lb

## 2022-01-14 DIAGNOSIS — Z9884 Bariatric surgery status: Secondary | ICD-10-CM

## 2022-01-14 DIAGNOSIS — M6281 Muscle weakness (generalized): Secondary | ICD-10-CM

## 2022-01-14 DIAGNOSIS — D508 Other iron deficiency anemias: Secondary | ICD-10-CM

## 2022-01-14 DIAGNOSIS — Q6211 Congenital occlusion of ureteropelvic junction: Secondary | ICD-10-CM

## 2022-01-14 DIAGNOSIS — N390 Urinary tract infection, site not specified: Secondary | ICD-10-CM

## 2022-01-14 LAB — B12 AND FOLATE PANEL
Folate: 10.4 ng/mL (ref >3.0)
Vitamin B-12: 850 pg/mL (ref 232–1245)

## 2022-01-14 LAB — URINALYSIS, ROUTINE W REFLEX MICROSCOPIC
Bilirubin, UA: NEGATIVE
Glucose, UA: NEGATIVE
Ketones, UA: NEGATIVE
Leukocytes,UA: NEGATIVE
Nitrite, UA: NEGATIVE
Protein,UA: NEGATIVE
RBC, UA: NEGATIVE
Specific Gravity, UA: 1.012 (ref 1.005–1.030)
Urobilinogen, Ur: 0.2 mg/dL (ref 0.2–1.0)
pH, UA: 6.5 (ref 5.0–7.5)

## 2022-01-14 LAB — CBC WITH DIFFERENTIAL/PLATELET
Basophils Absolute: 0 10*3/uL (ref 0.0–0.2)
Basos: 0 %
EOS (ABSOLUTE): 0.1 10*3/uL (ref 0.0–0.4)
Eos: 1 %
Hematocrit: 34.3 % (ref 34.0–46.6)
Hemoglobin: 11.2 g/dL (ref 11.1–15.9)
Immature Grans (Abs): 0 10*3/uL (ref 0.0–0.1)
Immature Granulocytes: 0 %
Lymphocytes Absolute: 2.9 10*3/uL (ref 0.7–3.1)
Lymphs: 39 %
MCH: 26.1 pg — ABNORMAL LOW (ref 26.6–33.0)
MCHC: 32.7 g/dL (ref 31.5–35.7)
MCV: 80 fL (ref 79–97)
Monocytes Absolute: 0.5 10*3/uL (ref 0.1–0.9)
Monocytes: 7 %
Neutrophils Absolute: 3.9 10*3/uL (ref 1.4–7.0)
Neutrophils: 53 %
Platelets: 295 10*3/uL (ref 150–450)
RBC: 4.29 x10E6/uL (ref 3.77–5.28)
RDW: 14.6 % (ref 11.7–15.4)
WBC: 7.3 10*3/uL (ref 3.4–10.8)

## 2022-01-14 LAB — COMPREHENSIVE METABOLIC PANEL
ALT: 16 IU/L (ref 0–32)
AST: 20 IU/L (ref 0–40)
Albumin/Globulin Ratio: 1.8 (ref 1.2–2.2)
Albumin: 4.1 g/dL (ref 3.8–4.9)
Alkaline Phosphatase: 90 IU/L (ref 44–121)
BUN/Creatinine Ratio: 9 (ref 9–23)
BUN: 10 mg/dL (ref 6–24)
Bilirubin Total: 0.3 mg/dL (ref 0.0–1.2)
CO2: 24 mmol/L (ref 20–29)
Calcium: 9.1 mg/dL (ref 8.7–10.2)
Chloride: 104 mmol/L (ref 96–106)
Creatinine, Ser: 1.06 mg/dL — ABNORMAL HIGH (ref 0.57–1.00)
Globulin, Total: 2.3 g/dL (ref 1.5–4.5)
Glucose: 87 mg/dL (ref 70–99)
Potassium: 3.2 mmol/L — ABNORMAL LOW (ref 3.5–5.2)
Sodium: 143 mmol/L (ref 134–144)
Total Protein: 6.4 g/dL (ref 6.0–8.5)
eGFR: 63 mL/min/{1.73_m2} (ref >59)

## 2022-01-14 LAB — IRON AND TIBC
Iron Saturation: 8 % — CL (ref 15–55)
Iron: 39 ug/dL (ref 27–159)
Total Iron Binding Capacity: 478 ug/dL — ABNORMAL HIGH (ref 250–450)
UIBC: 439 ug/dL — ABNORMAL HIGH (ref 131–425)

## 2022-01-14 LAB — URINE CULTURE: Organism ID, Bacteria: NO GROWTH

## 2022-01-14 LAB — FERRITIN: Ferritin: 9 ng/mL — ABNORMAL LOW (ref 15–150)

## 2022-01-17 ENCOUNTER — Other Ambulatory Visit: Payer: Self-pay | Admitting: Internal Medicine

## 2022-01-18 ENCOUNTER — Encounter: Payer: Self-pay | Admitting: Internal Medicine

## 2022-01-18 NOTE — Progress Notes (Signed)
Completed rooming for patient.

## 2022-01-19 LAB — URINALYSIS, ROUTINE W REFLEX MICROSCOPIC
Bilirubin, UA: NEGATIVE
Glucose, UA: NEGATIVE
Ketones, UA: NEGATIVE
Leukocytes,UA: NEGATIVE
Nitrite, UA: NEGATIVE
Protein,UA: NEGATIVE
RBC, UA: NEGATIVE
Specific Gravity, UA: 1.01 (ref 1.005–1.030)
Urobilinogen, Ur: 0.2 mg/dL (ref 0.2–1.0)
pH, UA: 7 (ref 5.0–7.5)

## 2022-01-19 LAB — URINE CULTURE

## 2022-01-19 NOTE — Progress Notes (Signed)
[facility]   Internal MEDICINE  Office Visit Note  Patient Name: Vickie Turner  [id number]  [id number]  Date of Service: 01/19/2022  Chief Complaint  Patient presents with   Urinary Tract Infection   Back Pain   Chronic Kidney Disease   Headache    weakness   Fatigue    HPI Pt is here for acute and sick visit She has a complicated urological hx. In April 2016 when had pyeloplasty. Had recurrent E coli UTI with increasing drug resistance. Has been treated with multiple rounds of oral abx with temporary relief but then recurrence shortly after stopping abx. Had seen urology Dr Cyd Silence, CT scan done at [facility], no stents, no stones, no obstruction.  Pt has been on dual abx for chronic suppressive therapy ( cipro plus macrobid), has been having recurrent UTI since Last year, worse in last couple of months  On 01/04/2022 She went to ED with weakness and chills, urine C/S showed > 100,000 colonies E.coli and Klebsiella, at the time she was given a dose of Fosfomycin ( 3 grams)  Later her Macrobid was held and Cipro was increased to 500 mg bid per C/S Pt continues to feel weak and has chills, she is C/O pain and fullness in her flanks, h/o hydronephrosis as well  Multiple abdominal surgeries with adhesions by history  Recent labs reviewed and has low ferritin and K    Current Medication: Outpatient Encounter Medications as of 01/14/2022  Medication Sig   acetaminophen (TYLENOL) 325 MG tablet Take 2 tablets (650 mg total) by mouth every 6 (six) hours as needed for mild pain, moderate pain, fever or headache (headache).   ciprofloxacin (CIPRO) 500 MG tablet Take 500 mg by mouth 2 (two) times daily.   ergocalciferol (VITAMIN D2) 1.25 MG (50000 UT) capsule Take by mouth.   nitrofurantoin, macrocrystal-monohydrate, (MACROBID) 100 MG capsule Take one tab po bid x 10 days then one tab M/W/F until all medicine is finished   ondansetron (ZOFRAN ODT) 4 MG disintegrating tablet Take 1  tablet (4 mg total) by mouth every 8 (eight) hours as needed.   pantoprazole (PROTONIX) 40 MG tablet Take 1 tablet (40 mg total) by mouth 2 (two) times daily.   potassium chloride (KLOR-CON) 20 MEQ packet Take 20 mEq by mouth daily.   No facility-administered encounter medications on file as of 01/14/2022.    Surgical History: Past Surgical History:  Procedure Laterality Date   ABDOMINAL HYSTERECTOMY     APPENDECTOMY     BACK SURGERY     CHOLECYSTECTOMY     COLONOSCOPY WITH PROPOFOL N/A 06/26/2018   Procedure: COLONOSCOPY WITH PROPOFOL;  Surgeon: Toledo, Boykin Nearing, MD;  Location: [facility] ENDOSCOPY;  Service: Gastroenterology;  Laterality: N/A;  NO INTERPRETER NECESSARY   ESOPHAGOGASTRODUODENOSCOPY (EGD) WITH PROPOFOL N/A 06/26/2018   Procedure: ESOPHAGOGASTRODUODENOSCOPY (EGD) WITH PROPOFOL;  Surgeon: Toledo, Boykin Nearing, MD;  Location: [facility] ENDOSCOPY;  Service: Gastroenterology;  Laterality: N/A;  NO INTERPRETER NECESSARY   PERIPHERAL VASCULAR CATHETERIZATION  10/02/2014   Procedure: PICC Line Insertion;  Surgeon: Annice Needy, MD;  Location: [facility] INVASIVE CV LAB;  Service: Cardiovascular;;   TONSILLECTOMY Bilateral     Medical History: Past Medical History:  Diagnosis Date   BRCA negative 11/2016   MyRisk neg, except BRCA 1 VUS; IBIS=5.2%   Chronic kidney disease    Family history of ovarian cancer    Urinary (tract) obstruction     Family History: Family History  Problem Relation Age  of Onset   Heart Problems Brother    Prostate cancer Brother 91       pat 1/2 brother   Ovarian cancer Sister 67       pat 1/2 sister   Cancer Sister 48       pat 1/2 sister--hepatobiliary   Kidney cancer Maternal Uncle 49    Social History   Socioeconomic History   Marital status: Married    Spouse name: Not on file   Number of children: Not on file   Years of education: Not on file   Highest education level: Not on file  Occupational History   Not on file  Tobacco Use   Smoking status:  Never   Smokeless tobacco: Never  Vaping Use   Vaping Use: Never used  Substance and Sexual Activity   Alcohol use: No   Drug use: No   Sexual activity: Yes    Birth control/protection: Surgical    Comment: Hysterectomy  Other Topics Concern   Not on file  Social History Narrative   Not on file   Social Determinants of Health   Financial Resource Strain: Not on file  Food Insecurity: Not on file  Transportation Needs: Not on file  Physical Activity: Not on file  Stress: Not on file  Social Connections: Not on file  Intimate Partner Violence: Not on file      Review of Systems  Constitutional:  Positive for appetite change, chills and fatigue. Negative for fever.  HENT:  Negative for congestion, mouth sores and postnasal drip.   Respiratory:  Negative for cough.   Cardiovascular:  Negative for chest pain.  Genitourinary:  Positive for flank pain.  Psychiatric/Behavioral: Negative.      Vital Signs: BP (!) 105/58 (BP Location: Left Arm, Patient Position: Sitting, Cuff Size: Normal)   Pulse 99   Temp 99.4 F (37.4 C) (Temporal)   Ht 5\' 8"  (1.727 m)   Wt 174 lb (78.9 kg) Comment: lb  BMI 26.46 kg/m    Physical Exam Constitutional:      Appearance: Normal appearance. She is ill-appearing.  HENT:     Head: Atraumatic.     Nose: Nose normal.     Mouth/Throat:     Pharynx: No posterior oropharyngeal erythema.  Eyes:     Extraocular Movements: Extraocular movements intact.     Pupils: Pupils are equal, round, and reactive to light.  Cardiovascular:     Pulses: Normal pulses.     Heart sounds: Normal heart sounds.  Pulmonary:     Effort: Pulmonary effort is normal.     Breath sounds: Normal breath sounds.  Abdominal:     Tenderness: There is abdominal tenderness.  Neurological:     General: No focal deficit present.     Mental Status: She is alert.  Psychiatric:        Mood and Affect: Mood normal.        Behavior: Behavior normal.         Assessment/Plan: 1. Recurrent UTI (urinary tract infection) Worsening symptoms, bacterial c/s reviwed, will need to have further diagnostics and f/u with urology  - US RENAL; Future - Ambulatory referral to Urology  2. Hydronephrosis with ureteropelvic junction (UPJ) obstruction Continue Cipro 500 mg po bid until seen by urology  - US RENAL; Future - Ambulatory referral to Urology  3. Generalized muscle weakness Chronic and recurrent infection, hypokalemia and IDA, low Ferritin, supplement K and mg  - Ambulatory referral to Hematology /  Oncology  4. Other iron deficiency anemia Will get benefit from Iron infusion  - Ambulatory referral to Hematology / Oncology  5. H/O gastric bypass This is indication for iron infusion  - Ambulatory referral to Hematology / Oncology   General Counseling: Azeneth verbalizes understanding of the findings of todays visit and agrees with plan of treatment. I have discussed any further diagnostic evaluation that may be needed or ordered today. We also reviewed her medications today. she has been encouraged to call the office with any questions or concerns that should arise related to todays visit.    Orders Placed This Encounter  Procedures   US RENAL   Ambulatory referral to Hematology / Oncology   Ambulatory referral to Urology    No orders of the defined types were placed in this encounter.   Total time spent:35 Minutes Time spent includes review of chart, medications, test results, and follow up plan with the patient.   [place] Controlled Substance Database was reviewed by me.   Dr Lavera Guise Internal medicine

## 2022-01-20 ENCOUNTER — Encounter: Payer: Self-pay | Admitting: Oncology

## 2022-01-20 LAB — KIDNEY PROFILE
Creatinine, Ser: 1.05 mg/dL — ABNORMAL HIGH (ref 0.57–1.00)
Creatinine, Urine: 79.4 mg/dL
Microalb/Creat Ratio: 26 mg/g creat (ref 0–29)
Microalbumin, Urine: 20.8 ug/mL
eGFR: 64 mL/min/{1.73_m2} (ref >59)

## 2022-01-20 LAB — SPECIMEN STATUS REPORT

## 2022-01-23 ENCOUNTER — Encounter: Payer: Self-pay | Admitting: Oncology

## 2022-01-23 ENCOUNTER — Ambulatory Visit
Admission: RE | Admit: 2022-01-23 | Discharge: 2022-01-23 | Disposition: A | Discharge status: Home / Self Care | Payer: PRIVATE HEALTH INSURANCE | Source: Ambulatory Visit | Attending: Internal Medicine | Admitting: Internal Medicine

## 2022-01-23 DIAGNOSIS — N390 Urinary tract infection, site not specified: Secondary | ICD-10-CM | POA: Insufficient documentation

## 2022-01-23 DIAGNOSIS — Q6211 Congenital occlusion of ureteropelvic junction: Secondary | ICD-10-CM | POA: Insufficient documentation

## 2022-01-27 NOTE — Addendum Note (Signed)
Addended byMargy Clarks on: 01/27/2022 01:17 PM   Modules accepted: Orders

## 2022-02-04 LAB — URINE CULTURE: Organism ID, Bacteria: NO GROWTH

## 2022-02-08 ENCOUNTER — Other Ambulatory Visit: Payer: Self-pay | Admitting: Family Medicine

## 2022-02-11 ENCOUNTER — Encounter: Payer: Self-pay | Admitting: Internal Medicine

## 2022-02-11 ENCOUNTER — Ambulatory Visit: Payer: Self-pay | Admitting: Internal Medicine

## 2022-02-11 VITALS — BP 104/56 | HR 77 | Temp 97.2°F | Ht 68.0 in | Wt 163.0 lb

## 2022-02-11 DIAGNOSIS — E876 Hypokalemia: Secondary | ICD-10-CM

## 2022-02-11 DIAGNOSIS — N951 Menopausal and female climacteric states: Secondary | ICD-10-CM

## 2022-02-11 DIAGNOSIS — I951 Orthostatic hypotension: Secondary | ICD-10-CM

## 2022-02-11 DIAGNOSIS — N390 Urinary tract infection, site not specified: Secondary | ICD-10-CM

## 2022-02-11 MED ORDER — POTASSIUM CHLORIDE ER 8 MEQ PO TBCR: 8.0000 meq | EXTENDED_RELEASE_TABLET | Freq: Two times a day (BID) | ORAL | 3 refills | Status: AC

## 2022-02-11 MED ORDER — ESTRADIOL 0.5 MG PO TABS: ORAL_TABLET | ORAL | 1 refills | Status: AC

## 2022-02-11 MED ORDER — FLUDROCORTISONE ACETATE 0.1 MG PO TABS: ORAL_TABLET | ORAL | 3 refills | Status: AC

## 2022-02-11 NOTE — Addendum Note (Signed)
Addended byMargy Clarks on: 02/11/2022 01:05 AM   Modules accepted: Orders

## 2022-02-11 NOTE — Progress Notes (Unsigned)
[facility]   Internal MEDICINE  Office Visit Note  Patient Name: Vickie Turner  [id number]  [id number]  Date of Service: 02/14/2022  Chief Complaint  Patient presents with   Follow-up    Kidney pain and abnormal lab UTI    HPI  Pt is seen for chronic, recurrent UTI She has been on multiple antibiotics, recently her antibiotics were changed to however she continues to have bacterial growth Patient also has been having symptomatic hypotension blood pressure drops into low 100s she gets really dizzy and tired,patient also gets diaphoretic her BUN is normal Patient also has history of gastric bypass and is in need of iron transfusion Patient also has ongoing hypokalemia History of menopause with no HRT   Current Medication: Outpatient Encounter Medications as of 02/11/2022  Medication Sig   cephALEXin (KEFLEX) 500 MG capsule Take 500 mg by mouth 2 (two) times daily. (Patient not taking: Reported on 02/13/2022)   estradiol (ESTRACE) 0.5 MG tablet Take one tab po qd for hormones (Patient not taking: Reported on 02/13/2022)   fludrocortisone (FLORINEF) 0.1 MG tablet Take one tab 2  a day as need for low blood pressure (Patient not taking: Reported on 02/13/2022)   potassium chloride (KLOR-CON) 8 MEQ tablet Take 1 tablet (8 mEq total) by mouth 2 (two) times daily. (Patient not taking: Reported on 02/13/2022)   acetaminophen (TYLENOL) 325 MG tablet Take 2 tablets (650 mg total) by mouth every 6 (six) hours as needed for mild pain, moderate pain, fever or headache (headache).   pantoprazole (PROTONIX) 40 MG tablet Take 1 tablet (40 mg total) by mouth 2 (two) times daily. (Patient not taking: Reported on 02/13/2022)   No facility-administered encounter medications on file as of 02/11/2022.    Surgical History: Past Surgical History:  Procedure Laterality Date   ABDOMINAL HYSTERECTOMY     APPENDECTOMY     BACK SURGERY     CHOLECYSTECTOMY     COLONOSCOPY WITH PROPOFOL N/A 06/26/2018   Procedure:  COLONOSCOPY WITH PROPOFOL;  Surgeon: Toledo, Benay Pike, MD;  Location: [facility] ENDOSCOPY;  Service: Gastroenterology;  Laterality: N/A;  NO INTERPRETER NECESSARY   ESOPHAGOGASTRODUODENOSCOPY (EGD) WITH PROPOFOL N/A 06/26/2018   Procedure: ESOPHAGOGASTRODUODENOSCOPY (EGD) WITH PROPOFOL;  Surgeon: Toledo, Benay Pike, MD;  Location: [facility] ENDOSCOPY;  Service: Gastroenterology;  Laterality: N/A;  NO INTERPRETER NECESSARY   PERIPHERAL VASCULAR CATHETERIZATION  10/02/2014   Procedure: PICC Line Insertion;  Surgeon: Algernon Huxley, MD;  Location: [place] CV LAB;  Service: Cardiovascular;;   TONSILLECTOMY Bilateral     Medical History: Past Medical History:  Diagnosis Date   Anemia    BRCA negative 11/2016   MyRisk neg, except BRCA 1 VUS; IBIS=5.2%   Chronic kidney disease    Family history of ovarian cancer    Urinary (tract) obstruction     Family History: Family History  Problem Relation Age of Onset   Ovarian cancer Sister 31       pat 1/2 sister   Cancer Sister 80       pat 1/2 sister--hepatobiliary   Heart Problems Brother    Prostate cancer Brother 52       pat 1/2 brother   Lung cancer Brother    Kidney cancer Maternal Uncle 93   Anesthesia problems Paternal Uncle     Social History   Socioeconomic History   Marital status: Married    Spouse name: Not on file   Number of children: Not on file   Years  of education: Not on file   Highest education level: Not on file  Occupational History   Not on file  Tobacco Use   Smoking status: Never   Smokeless tobacco: Never  Vaping Use   Vaping Use: Never used  Substance and Sexual Activity   Alcohol use: No   Drug use: No   Sexual activity: Yes    Birth control/protection: Surgical    Comment: Hysterectomy  Other Topics Concern   Not on file  Social History Narrative   Not on file   Social Determinants of Health   Financial Resource Strain: Not on file  Food Insecurity: No Food Insecurity (02/13/2022)   Hunger Vital  Sign    Worried About Running Out of Food in the Last Year: Never true    Ran Out of Food in the Last Year: Never true  Transportation Needs: No Transportation Needs (02/13/2022)   PRAPARE - Hydrologist (Medical): No    Lack of Transportation (Non-Medical): No  Physical Activity: Not on file  Stress: Not on file  Social Connections: Not on file  Intimate Partner Violence: Not At Risk (02/13/2022)   Humiliation, Afraid, Rape, and Kick questionnaire    Fear of Current or Ex-Partner: No    Emotionally Abused: No    Physically Abused: No    Sexually Abused: No      Review of Systems  Constitutional:  Positive for diaphoresis and fatigue. Negative for fever.  HENT:  Negative for congestion, mouth sores and postnasal drip.   Respiratory:  Negative for cough.   Cardiovascular:  Negative for chest pain.       Low blood pressure  Genitourinary:  Negative for flank pain.  Psychiatric/Behavioral: Negative.      Vital Signs: BP (!) 104/56 (BP Location: Left Arm, Cuff Size: Normal)   Pulse 77   Temp (!) 97.2 F (36.2 C) (Oral)   Ht 5\' 8"  (1.727 m)   Wt 163 lb (73.9 kg)   SpO2 98%   BMI 24.78 kg/m    Physical Exam Constitutional:      Appearance: Normal appearance.  HENT:     Head: Normocephalic and atraumatic.     Nose: Nose normal.     Mouth/Throat:     Mouth: Mucous membranes are moist.     Pharynx: No posterior oropharyngeal erythema.  Eyes:     Extraocular Movements: Extraocular movements intact.     Pupils: Pupils are equal, round, and reactive to light.  Cardiovascular:     Pulses: Normal pulses.     Heart sounds: Normal heart sounds.  Pulmonary:     Effort: Pulmonary effort is normal.     Breath sounds: Normal breath sounds.  Neurological:     General: No focal deficit present.     Mental Status: She is alert.  Psychiatric:        Mood and Affect: Mood normal.        Behavior: Behavior normal.        Assessment/Plan: 1.  Recurrent UTI (urinary tract infection) Patient is to see urology and ID at the same time, her recent cultures grew Enterococcus faecalis patient antibiotic was changed accordingly now on Macrobid 100 mg twice a day, she has been on Cipro and cephalexin in the past - Basic Metabolic Panel (BMET) - Ambulatory referral to Infectious Disease  2. Chronic orthostatic hypotension Patient has symptomatic orthostatic hypotension we will start on low-dose - fludrocortisone (FLORINEF) 0.1 MG tablet; Take  one tab 2  a day as need for low blood pressure (Patient not taking: Reported on 02/13/2022)  Dispense: 60 tablet; Refill: 3 - Basic Metabolic Panel (BMET)  3. Hypokalemia Replacement therapy is recommended - potassium chloride (KLOR-CON) 8 MEQ tablet; Take 1 tablet (8 mEq total) by mouth 2 (two) times daily. (Patient not taking: Reported on 02/13/2022)  Dispense: 60 tablet; Refill: 3 - Basic Metabolic Panel (BMET)  4. Menopause syndrome With ongoing recurrent UTI most likely patient has atrophic vaginitis low-dose hormone will help her patient cannot take intravaginal due to allergies - estradiol (ESTRACE) 0.5 MG tablet; Take one tab po qd for hormones (Patient not taking: Reported on 02/13/2022)  Dispense: 90 tablet; Refill: 1   General Counseling: Adelise verbalizes understanding of the findings of todays visit and agrees with plan of treatment. I have discussed any further diagnostic evaluation that may be needed or ordered today. We also reviewed her medications today. she has been encouraged to call the office with any questions or concerns that should arise related to todays visit.    Orders Placed This Encounter  Procedures   Basic Metabolic Panel (BMET)   Ambulatory referral to Infectious Disease    Meds ordered this encounter  Medications   fludrocortisone (FLORINEF) 0.1 MG tablet    Sig: Take one tab 2  a day as need for low blood pressure    Dispense:  60 tablet    Refill:  3   estradiol  (ESTRACE) 0.5 MG tablet    Sig: Take one tab po qd for hormones    Dispense:  90 tablet    Refill:  1   potassium chloride (KLOR-CON) 8 MEQ tablet    Sig: Take 1 tablet (8 mEq total) by mouth 2 (two) times daily.    Dispense:  60 tablet    Refill:  3    Total time spent:35 Minutes Time spent includes review of chart, medications, test results, and follow up plan with the patient.   [place] Controlled Substance Database was reviewed by me.   Dr Lavera Guise Internal medicine

## 2022-02-12 LAB — COMPREHENSIVE METABOLIC PANEL
ALT: 6 IU/L (ref 0–32)
AST: 13 IU/L (ref 0–40)
Albumin/Globulin Ratio: 1.8 (ref 1.2–2.2)
Albumin: 4.1 g/dL (ref 3.8–4.9)
Alkaline Phosphatase: 79 IU/L (ref 44–121)
BUN/Creatinine Ratio: 12 (ref 9–23)
BUN: 13 mg/dL (ref 6–24)
Bilirubin Total: 0.3 mg/dL (ref 0.0–1.2)
CO2: 24 mmol/L (ref 20–29)
Calcium: 9 mg/dL (ref 8.7–10.2)
Chloride: 101 mmol/L (ref 96–106)
Globulin, Total: 2.3 g/dL (ref 1.5–4.5)
Glucose: 89 mg/dL (ref 70–99)
Potassium: 3 mmol/L — ABNORMAL LOW (ref 3.5–5.2)
Sodium: 140 mmol/L (ref 134–144)
Total Protein: 6.4 g/dL (ref 6.0–8.5)

## 2022-02-12 LAB — CBC WITH DIFFERENTIAL/PLATELET
Basophils Absolute: 0 10*3/uL (ref 0.0–0.2)
Basos: 0 %
EOS (ABSOLUTE): 0.1 10*3/uL (ref 0.0–0.4)
Eos: 1 %
Hematocrit: 35 % (ref 34.0–46.6)
Hemoglobin: 11.4 g/dL (ref 11.1–15.9)
Immature Grans (Abs): 0 10*3/uL (ref 0.0–0.1)
Immature Granulocytes: 0 %
Lymphocytes Absolute: 3.1 10*3/uL (ref 0.7–3.1)
Lymphs: 34 %
MCH: 25.8 pg — ABNORMAL LOW (ref 26.6–33.0)
MCHC: 32.6 g/dL (ref 31.5–35.7)
MCV: 79 fL (ref 79–97)
Monocytes Absolute: 0.5 10*3/uL (ref 0.1–0.9)
Monocytes: 6 %
Neutrophils Absolute: 5.4 10*3/uL (ref 1.4–7.0)
Neutrophils: 59 %
Platelets: 265 10*3/uL (ref 150–450)
RBC: 4.42 x10E6/uL (ref 3.77–5.28)
RDW: 13.9 % (ref 11.7–15.4)
WBC: 9.1 10*3/uL (ref 3.4–10.8)

## 2022-02-12 LAB — IRON AND TIBC
Iron Saturation: 11 % — ABNORMAL LOW (ref 15–55)
Iron: 45 ug/dL (ref 27–159)
Total Iron Binding Capacity: 414 ug/dL (ref 250–450)
UIBC: 369 ug/dL (ref 131–425)

## 2022-02-12 LAB — MICROSCOPIC EXAMINATION
Casts: NONE SEEN /lpf
RBC, Urine: NONE SEEN /hpf (ref 0–2)

## 2022-02-12 LAB — URINALYSIS, ROUTINE W REFLEX MICROSCOPIC
Bilirubin, UA: NEGATIVE
Glucose, UA: NEGATIVE
Nitrite, UA: NEGATIVE
Protein,UA: NEGATIVE
RBC, UA: NEGATIVE
Specific Gravity, UA: 1.013 (ref 1.005–1.030)
Urobilinogen, Ur: 0.2 mg/dL (ref 0.2–1.0)
pH, UA: 6 (ref 5.0–7.5)

## 2022-02-12 LAB — FERRITIN: Ferritin: 10 ng/mL — ABNORMAL LOW (ref 15–150)

## 2022-02-12 LAB — KIDNEY PROFILE
Creatinine, Ser: 1.1 mg/dL — ABNORMAL HIGH (ref 0.57–1.00)
Creatinine, Urine: 118.1 mg/dL
Microalb/Creat Ratio: 15 mg/g creat (ref 0–29)
Microalbumin, Urine: 17.8 ug/mL
eGFR: 60 mL/min/{1.73_m2} (ref >59)

## 2022-02-12 LAB — URINE CULTURE

## 2022-02-13 ENCOUNTER — Encounter: Payer: Self-pay | Admitting: Internal Medicine

## 2022-02-13 ENCOUNTER — Inpatient Hospital Stay: Payer: Self-pay | Attending: Internal Medicine | Admitting: Internal Medicine

## 2022-02-13 ENCOUNTER — Inpatient Hospital Stay: Payer: Self-pay

## 2022-02-13 VITALS — BP 127/81 | HR 79 | Temp 96.3°F | Resp 16 | Wt 162.0 lb

## 2022-02-13 DIAGNOSIS — E559 Vitamin D deficiency, unspecified: Secondary | ICD-10-CM

## 2022-02-13 DIAGNOSIS — Z9884 Bariatric surgery status: Secondary | ICD-10-CM

## 2022-02-13 DIAGNOSIS — D649 Anemia, unspecified: Secondary | ICD-10-CM

## 2022-02-13 DIAGNOSIS — N189 Chronic kidney disease, unspecified: Secondary | ICD-10-CM

## 2022-02-13 DIAGNOSIS — E611 Iron deficiency: Secondary | ICD-10-CM

## 2022-02-13 DIAGNOSIS — K909 Intestinal malabsorption, unspecified: Secondary | ICD-10-CM

## 2022-02-13 NOTE — Assessment & Plan Note (Addendum)
#   Anemia- Hb-11- symptomatic.  Likely due to iron deficiency - from etiology GI malabsorption/history of gastric bypass.  Patient has poor tolerance/lack of improvement on oral iron.    # Discussed regarding IV iron infusion/Venofer. Discussed the potential acute infusion reactions with IV iron; which are quite rare.  Patient understands the risk; will proceed with infusions.  Patient had previous infusion without any major side effects.  #Etiology of iron deficiency: Likely due to iron deficiency - from etiology GI malabsorption/history of gastric bypass.  Hold off further workup at this time.  # B12: JAn 2024- WNL- monitor for now.   # Hx of frequent UTIs/ ? CKD [Dr.David Ronlad; [facility]]-    # Severe vit D def- JULy 2023- vit D 8 [pcp]- recommend compliance with vit D 50,000/ weekly [script from PCP].   Thank you Dr. Humphrey Rolls for allowing me to participate in the care of your pleasant patient. Please do not hesitate to contact me with questions or concerns in the interim.  # DISPOSITION: # NO labs today # venofer weekly x 3 # follow up in 2 month-MD: labs- cbc;bmp;iron studies; ferritin; vit D 25-OH - possible venofer-Dr.B

## 2022-02-13 NOTE — Progress Notes (Signed)
[facility] CONSULT NOTE  Patient Care Team: Pcp, No as PCP - General  CHIEF COMPLAINTS/PURPOSE OF CONSULTATION: ANEMIA   HEMATOLOGY HISTORY  # ANEMIA[Hb; MCV-platelets- WBC; Iron sat; ferritin;  GFR- CT/US- ;  EGD/colonoscopy-JUNE 2020-NEGATIVE [Dr.Toledo]-gastric bypass 2011 [[facility]]  # Hx of multiple UTIs/? Ureteral damage[2017] [s/p TAH 2007]-   Latest Reference Range & Units 02/08/22 15:15  Iron 27 - 159 ug/dL 45  UIBC 098 - 119 ug/dL 147  TIBC 829 - 562 ug/dL 130  Ferritin 15 - 865 ng/mL 10 (L)  Iron Saturation 15 - 55 % 11 (L)  (L): Data is abnormally low   Latest Reference Range & Units 02/08/22 15:15  WBC 3.4 - 10.8 x10E3/uL 9.1  RBC 3.77 - 5.28 x10E6/uL 4.42  Hemoglobin 11.1 - 15.9 g/dL 78.4  HCT 69.6 - 29.5 % 35.0  MCV 79 - 97 fL 79  MCH 26.6 - 33.0 pg 25.8 (L)  MCHC 31.5 - 35.7 g/dL 28.4  RDW 13.2 - 44.0 % 13.9  Platelets 150 - 450 x10E3/uL 265  (L): Data is abnormally low  HISTORY OF PRESENTING ILLNESS: Ambulating independently.  Accompanied by her husband.  Vickie Turner 52 y.o.  female pleasant patient with prior history of gastric bypass is been referred to [place] for further evaluation of anemia.  Patient has a longstanding history of anemia.  History of anemia with last treatment of Feraheme in 2018 at [facility].   Patient complains of extreme fatigue.  Complains of body aches joint pains.  Patient complains history of multiple UTIs-status post multiple antibiotics.   Blood in stools: none; EGD/[place]- June 2020 [Dr.Toledo] Blood in urine: none Difficulty swallowing:none Change of bowel movement/constipation: none Prior blood transfusion: yes; > on 2017 sec to surgery [adhesions; ureter involved s/p TAH- 2007] Prior history of blood loss: none Liver disease: none Alcohol: none Bariatric surgery: gastric bypass, 2011 [60 pounds]    Review of Systems  Constitutional:  Positive for malaise/fatigue. Negative for chills, diaphoresis, fever and  weight loss.  HENT:  Negative for nosebleeds and sore throat.   Eyes:  Negative for double vision.  Respiratory:  Negative for cough, hemoptysis, sputum production, shortness of breath and wheezing.   Cardiovascular:  Negative for chest pain, palpitations, orthopnea and leg swelling.  Gastrointestinal:  Positive for constipation. Negative for abdominal pain, blood in stool, diarrhea, heartburn, melena, nausea and vomiting.  Genitourinary:  Negative for dysuria, frequency and urgency.  Musculoskeletal:  Positive for myalgias. Negative for back pain and joint pain.  Skin: Negative.  Negative for itching and rash.  Neurological:  Positive for dizziness. Negative for tingling, focal weakness, weakness and headaches.  Endo/Heme/Allergies:  Does not bruise/bleed easily.  Psychiatric/Behavioral:  Negative for depression. The patient is not nervous/anxious and does not have insomnia.      MEDICAL HISTORY:  Past Medical History:  Diagnosis Date   Anemia    BRCA negative 11/2016   MyRisk neg, except BRCA 1 VUS; IBIS=5.2%   Chronic kidney disease    Family history of ovarian cancer    Urinary (tract) obstruction     SURGICAL HISTORY: Past Surgical History:  Procedure Laterality Date   ABDOMINAL HYSTERECTOMY     APPENDECTOMY     BACK SURGERY     CHOLECYSTECTOMY     COLONOSCOPY WITH PROPOFOL N/A 06/26/2018   Procedure: COLONOSCOPY WITH PROPOFOL;  Surgeon: Toledo, Boykin Nearing, MD;  Location: [facility];  Service: Gastroenterology;  Laterality: N/A;  NO INTERPRETER NECESSARY   ESOPHAGOGASTRODUODENOSCOPY (  EGD) WITH PROPOFOL N/A 06/26/2018   Procedure: ESOPHAGOGASTRODUODENOSCOPY (EGD) WITH PROPOFOL;  Surgeon: Toledo, Benay Pike, MD;  Location: [facility];  Service: Gastroenterology;  Laterality: N/A;  NO INTERPRETER NECESSARY   PERIPHERAL VASCULAR CATHETERIZATION  10/02/2014   Procedure: PICC Line Insertion;  Surgeon: Algernon Huxley, MD;  Location: [facility];  Service: Cardiovascular;;    TONSILLECTOMY Bilateral     SOCIAL HISTORY: Social History   Socioeconomic History   Marital status: Married    Spouse name: Not on file   Number of children: Not on file   Years of education: Not on file   Highest education level: Not on file  Occupational History   Not on file  Tobacco Use   Smoking status: Never   Smokeless tobacco: Never  Vaping Use   Vaping Use: Never used  Substance and Sexual Activity   Alcohol use: No   Drug use: No   Sexual activity: Yes    Birth control/protection: Surgical    Comment: Hysterectomy  Other Topics Concern   Not on file  Social History Narrative   Not on file   Social Determinants of Health   Financial Resource Strain: Not on file  Food Insecurity: No Food Insecurity (02/13/2022)   Hunger Vital Sign    Worried About Running Out of Food in the Last Year: Never true    Ran Out of Food in the Last Year: Never true  Transportation Needs: No Transportation Needs (02/13/2022)   PRAPARE - Hydrologist (Medical): No    Lack of Transportation (Non-Medical): No  Physical Activity: Not on file  Stress: Not on file  Social Connections: Not on file  Intimate Partner Violence: Not At Risk (02/13/2022)   Humiliation, Afraid, Rape, and Kick questionnaire    Fear of Current or Ex-Partner: No    Emotionally Abused: No    Physically Abused: No    Sexually Abused: No    FAMILY HISTORY: Family History  Problem Relation Age of Onset   Ovarian cancer Sister 54       pat 1/2 sister   Cancer Sister 66       pat 1/2 sister--hepatobiliary   Heart Problems Brother    Prostate cancer Brother 48       pat 1/2 brother   Lung cancer Brother    Kidney cancer Maternal Uncle 60   Anesthesia problems Paternal Uncle     ALLERGIES:  is allergic to sulfa antibiotics, other, and tape.  MEDICATIONS:  Current Outpatient Medications  Medication Sig Dispense Refill   acetaminophen (TYLENOL) 325 MG tablet Take 2 tablets (650  mg total) by mouth every 6 (six) hours as needed for mild pain, moderate pain, fever or headache (headache).     nitrofurantoin, macrocrystal-monohydrate, (MACROBID) 100 MG capsule Take 100 mg by mouth 2 (two) times daily.     omeprazole (PRILOSEC) 40 MG capsule Take 40 mg by mouth daily.     cephALEXin (KEFLEX) 500 MG capsule Take 500 mg by mouth 2 (two) times daily. (Patient not taking: Reported on 02/13/2022)     estradiol (ESTRACE) 0.5 MG tablet Take one tab po qd for hormones (Patient not taking: Reported on 02/13/2022) 90 tablet 1   fludrocortisone (FLORINEF) 0.1 MG tablet Take one tab 2  a day as need for low blood pressure (Patient not taking: Reported on 02/13/2022) 60 tablet 3   pantoprazole (PROTONIX) 40 MG tablet Take 1 tablet (40 mg total) by mouth  2 (two) times daily. (Patient not taking: Reported on 02/13/2022) 60 tablet 0   potassium chloride (KLOR-CON) 8 MEQ tablet Take 1 tablet (8 mEq total) by mouth 2 (two) times daily. (Patient not taking: Reported on 02/13/2022) 60 tablet 3   No current facility-administered medications for this visit.    PHYSICAL EXAMINATION:   Vitals:   02/13/22 1100  BP: 127/81  Pulse: 79  Resp: 16  Temp: (!) 96.3 F (35.7 C)   Filed Weights   02/13/22 1100  Weight: 162 lb (73.5 kg)    Physical Exam Vitals and nursing note reviewed.  HENT:     Head: Normocephalic and atraumatic.     Mouth/Throat:     Pharynx: Oropharynx is clear.  Eyes:     Extraocular Movements: Extraocular movements intact.     Pupils: Pupils are equal, round, and reactive to light.  Cardiovascular:     Rate and Rhythm: Normal rate and regular rhythm.  Pulmonary:     Comments: Decreased breath sounds bilaterally.  Abdominal:     Palpations: Abdomen is soft.  Musculoskeletal:        General: Normal range of motion.     Cervical back: Normal range of motion.  Skin:    General: Skin is warm.  Neurological:     General: No focal deficit present.     Mental Status: She is  alert and oriented to person, place, and time.  Psychiatric:        Behavior: Behavior normal.        Judgment: Judgment normal.      LABORATORY DATA:  I have reviewed the data as listed Lab Results  Component Value Date   WBC 9.1 02/08/2022   HGB 11.4 02/08/2022   HCT 35.0 02/08/2022   MCV 79 02/08/2022   PLT 265 02/08/2022   Recent Labs    05/10/21 1924 05/31/21 1551 11/28/21 1317 01/04/22 1352 01/12/22 1357 02/08/22 1515  NA 137   < > 141 143 143 140  K 2.6*   < > 3.5 3.4* 3.2* 3.0*  CL 101   < > 102 109 104 101  CO2 28   < > 26 28 24 24   GLUCOSE 154*   < > 89 82 87 89  BUN 15   < > 18 16 10 13   CREATININE 0.67   < > 0.99 0.95 1.05*  1.06* 1.10*  CALCIUM 9.0   < > 8.9 9.0 9.1 9.0  GFRNONAA >60  --   --  >60  --   --   PROT 6.9   < > 6.3  --  6.4 6.4  ALBUMIN 3.9   < > 3.9  --  4.1 4.1  AST 24   < > 18  --  20 13  ALT 16   < > 13  --  16 6  ALKPHOS 114   < > 95  --  90 79  BILITOT 0.5   < > 0.3  --  0.3 0.3   < > = values in this interval not displayed.     US RENAL  Result Date: 01/23/2022 CLINICAL DATA:  Flank pain for 4 weeks. EXAM: RENAL / URINARY TRACT ULTRASOUND COMPLETE COMPARISON:  October 21, 2006 FINDINGS: Right Kidney: Renal measurements: 10.6 x 4.8 X 5.6 cm = volume: 124. mL. Echogenicity within normal limits. No hydronephrosis visualized. There is a 2.3 x 2.4 x 2.2 cm simple cyst in the midpole right kidney. No follow-up is recommended.  Left Kidney: Renal measurements: 8.8 x 6.2 x 5.1 cm = volume: 114.2 mL. Echogenicity within normal limits. No hydronephrosis visualized. 2.5 x 2.4 x 2.6 cm simple cyst in the lower pole left kidney. No follow-up is recommended. Bladder: Appears normal for degree of bladder distention. Other: None. IMPRESSION: No acute abnormality identified. Electronically Signed   By: Abelardo Diesel M.D.   On: 01/23/2022 14:06    ASSESSMENT & PLAN:   Iron deficiency # Anemia- Hb-11- symptomatic.  Likely due to iron deficiency - from  etiology GI malabsorption/history of gastric bypass.  Patient has poor tolerance/lack of improvement on oral iron.    # Discussed regarding IV iron infusion/Venofer. Discussed the potential acute infusion reactions with IV iron; which are quite rare.  Patient understands the risk; will proceed with infusions.  Patient had previous infusion without any major side effects.  #Etiology of iron deficiency: Likely due to iron deficiency - from etiology GI malabsorption/history of gastric bypass.  Hold off further workup at this time.  # B12: JAn 2024- WNL- monitor for now.   # Hx of frequent UTIs/ ? CKD [Dr.David Ronlad; [facility]]-    # Severe vit D def- JULy 2023- vit D 8 [pcp]- recommend compliance with vit D 50,000/ weekly [script from PCP].   Thank you Dr. Humphrey Rolls for allowing me to participate in the care of your pleasant patient. Please do not hesitate to contact me with questions or concerns in the interim.  # DISPOSITION: # NO labs today # venofer weekly x 3 # follow up in 2 month-MD: labs- cbc;bmp;iron studies; ferritin; vit D 25-OH - possible venofer-Dr.B    All questions were answered. The patient knows to call the clinic with any problems, questions or concerns.    Cammie Sickle, MD 02/13/2022 12:39 PM

## 2022-02-13 NOTE — Progress Notes (Signed)
New patient evaluation for anemia.  History of anemia with last treatment of Feraheme in 2017 at [place].

## 2022-02-22 ENCOUNTER — Inpatient Hospital Stay: Payer: Self-pay

## 2022-02-22 VITALS — BP 115/64 | HR 75 | Temp 97.3°F | Resp 16

## 2022-02-22 DIAGNOSIS — D509 Iron deficiency anemia, unspecified: Secondary | ICD-10-CM

## 2022-02-22 MED ORDER — SODIUM CHLORIDE 0.9 % IV SOLN
200.0000 mg | Freq: Once | INTRAVENOUS | Status: AC
  Administered 2022-02-22: 200 mg via INTRAVENOUS
  Filled 2022-02-22: qty 200

## 2022-02-22 MED ORDER — SODIUM CHLORIDE 0.9 % IV SOLN
Freq: Once | INTRAVENOUS | Status: AC
  Filled 2022-02-22: qty 250

## 2022-02-22 NOTE — Patient Instructions (Signed)

## 2022-03-01 ENCOUNTER — Other Ambulatory Visit: Payer: Self-pay | Admitting: Internal Medicine

## 2022-03-02 ENCOUNTER — Inpatient Hospital Stay: Payer: Self-pay

## 2022-03-02 VITALS — BP 138/85 | HR 70 | Temp 97.5°F | Resp 16

## 2022-03-02 DIAGNOSIS — D509 Iron deficiency anemia, unspecified: Secondary | ICD-10-CM

## 2022-03-02 MED ORDER — SODIUM CHLORIDE 0.9 % IV SOLN
Freq: Once | INTRAVENOUS | Status: AC
  Filled 2022-03-02: qty 250

## 2022-03-02 MED ORDER — SODIUM CHLORIDE 0.9 % IV SOLN
200.0000 mg | Freq: Once | INTRAVENOUS | Status: AC
  Administered 2022-03-02: 200 mg via INTRAVENOUS
  Filled 2022-03-02: qty 200

## 2022-03-02 NOTE — Patient Instructions (Signed)

## 2022-03-05 LAB — URINALYSIS, ROUTINE W REFLEX MICROSCOPIC
Bilirubin, UA: NEGATIVE
Glucose, UA: NEGATIVE
Ketones, UA: NEGATIVE
Nitrite, UA: NEGATIVE
Protein,UA: NEGATIVE
RBC, UA: NEGATIVE
Specific Gravity, UA: 1.014 (ref 1.005–1.030)
Urobilinogen, Ur: 0.2 mg/dL (ref 0.2–1.0)
pH, UA: 6 (ref 5.0–7.5)

## 2022-03-05 LAB — COMPREHENSIVE METABOLIC PANEL
ALT: 15 IU/L (ref 0–32)
AST: 20 IU/L (ref 0–40)
Albumin/Globulin Ratio: 2.2 (ref 1.2–2.2)
Albumin: 4.1 g/dL (ref 3.8–4.9)
Alkaline Phosphatase: 83 IU/L (ref 44–121)
BUN/Creatinine Ratio: 15 (ref 9–23)
BUN: 18 mg/dL (ref 6–24)
Bilirubin Total: 0.3 mg/dL (ref 0.0–1.2)
CO2: 24 mmol/L (ref 20–29)
Calcium: 9.6 mg/dL (ref 8.7–10.2)
Chloride: 102 mmol/L (ref 96–106)
Globulin, Total: 1.9 g/dL (ref 1.5–4.5)
Glucose: 82 mg/dL (ref 70–99)
Potassium: 3.5 mmol/L (ref 3.5–5.2)
Sodium: 140 mmol/L (ref 134–144)
Total Protein: 6 g/dL (ref 6.0–8.5)

## 2022-03-05 LAB — KIDNEY PROFILE
Creatinine, Ser: 1.17 mg/dL — ABNORMAL HIGH (ref 0.57–1.00)
Creatinine, Urine: 108.4 mg/dL
Microalb/Creat Ratio: 9 mg/g creat (ref 0–29)
Microalbumin, Urine: 9.3 ug/mL
eGFR: 55 mL/min/{1.73_m2} — ABNORMAL LOW (ref >59)

## 2022-03-05 LAB — MICROSCOPIC EXAMINATION: Casts: NONE SEEN /lpf

## 2022-03-05 LAB — URINE CULTURE

## 2022-03-05 NOTE — Progress Notes (Signed)
Reviewed

## 2022-03-08 MED FILL — Iron Sucrose Inj 20 MG/ML (Fe Equiv): INTRAVENOUS | Qty: 10 | Status: AC

## 2022-03-09 ENCOUNTER — Inpatient Hospital Stay: Payer: Self-pay

## 2022-03-10 ENCOUNTER — Telehealth: Payer: Self-pay | Admitting: Internal Medicine

## 2022-03-10 NOTE — Telephone Encounter (Signed)
Called to let patient know appointments are scheduled as planned- per chat

## 2022-03-13 ENCOUNTER — Inpatient Hospital Stay: Payer: Self-pay | Attending: Internal Medicine

## 2022-03-13 ENCOUNTER — Other Ambulatory Visit: Payer: Self-pay | Admitting: Internal Medicine

## 2022-03-13 VITALS — BP 112/82 | HR 70 | Temp 97.8°F | Resp 16

## 2022-03-13 DIAGNOSIS — E611 Iron deficiency: Secondary | ICD-10-CM | POA: Insufficient documentation

## 2022-03-13 DIAGNOSIS — D509 Iron deficiency anemia, unspecified: Secondary | ICD-10-CM

## 2022-03-13 DIAGNOSIS — Z9884 Bariatric surgery status: Secondary | ICD-10-CM | POA: Insufficient documentation

## 2022-03-13 MED ORDER — SODIUM CHLORIDE 0.9 % IV SOLN
Freq: Once | INTRAVENOUS | Status: AC
  Filled 2022-03-13: qty 250

## 2022-03-13 MED ORDER — SODIUM CHLORIDE 0.9 % IV SOLN
200.0000 mg | Freq: Once | INTRAVENOUS | Status: AC
  Administered 2022-03-13: 200 mg via INTRAVENOUS
  Filled 2022-03-13: qty 200

## 2022-03-13 NOTE — Progress Notes (Signed)
Patient declined to wait the 30 minutes for post iron infusion observation today. She has received this several time previously and has tolerated well.

## 2022-03-15 LAB — URINALYSIS, ROUTINE W REFLEX MICROSCOPIC
Bilirubin, UA: NEGATIVE
Glucose, UA: NEGATIVE
Ketones, UA: NEGATIVE
Leukocytes,UA: NEGATIVE
Nitrite, UA: NEGATIVE
Protein,UA: NEGATIVE
RBC, UA: NEGATIVE
Specific Gravity, UA: 1.009 (ref 1.005–1.030)
Urobilinogen, Ur: 0.2 mg/dL (ref 0.2–1.0)
pH, UA: 6.5 (ref 5.0–7.5)

## 2022-03-15 LAB — URINE CULTURE: Organism ID, Bacteria: NO GROWTH

## 2022-03-15 LAB — KIDNEY PROFILE
Creatinine, Ser: 0.95 mg/dL (ref 0.57–1.00)
Creatinine, Urine: 54.7 mg/dL
Microalb/Creat Ratio: <5 mg/g creat (ref 0–29)
Microalbumin, Urine: <3 ug/mL
eGFR: 71 mL/min/{1.73_m2} (ref >59)

## 2022-03-15 LAB — POTASSIUM: Potassium: 3.3 mmol/L — ABNORMAL LOW (ref 3.5–5.2)

## 2022-03-20 ENCOUNTER — Other Ambulatory Visit: Payer: Self-pay | Admitting: Internal Medicine

## 2022-03-22 LAB — URINALYSIS, ROUTINE W REFLEX MICROSCOPIC
Bilirubin, UA: NEGATIVE
Glucose, UA: NEGATIVE
Ketones, UA: NEGATIVE
Leukocytes,UA: NEGATIVE
Nitrite, UA: NEGATIVE
Protein,UA: NEGATIVE
RBC, UA: NEGATIVE
Specific Gravity, UA: 1.014 (ref 1.005–1.030)
Urobilinogen, Ur: 0.2 mg/dL (ref 0.2–1.0)
pH, UA: 6 (ref 5.0–7.5)

## 2022-03-22 LAB — URINE CULTURE: Organism ID, Bacteria: NO GROWTH

## 2022-04-05 ENCOUNTER — Other Ambulatory Visit: Payer: Self-pay | Admitting: Family Medicine

## 2022-04-07 LAB — URINALYSIS, ROUTINE W REFLEX MICROSCOPIC
Bilirubin, UA: NEGATIVE
Glucose, UA: NEGATIVE
Leukocytes,UA: NEGATIVE
Nitrite, UA: NEGATIVE
Protein,UA: NEGATIVE
RBC, UA: NEGATIVE
Specific Gravity, UA: 1.019 (ref 1.005–1.030)
Urobilinogen, Ur: 0.2 mg/dL (ref 0.2–1.0)
pH, UA: 6 (ref 5.0–7.5)

## 2022-04-07 LAB — COMPREHENSIVE METABOLIC PANEL
ALT: 21 IU/L (ref 0–32)
AST: 25 IU/L (ref 0–40)
Albumin/Globulin Ratio: 1.9 (ref 1.2–2.2)
Albumin: 4.2 g/dL (ref 3.8–4.9)
Alkaline Phosphatase: 96 IU/L (ref 44–121)
BUN/Creatinine Ratio: 15 (ref 9–23)
BUN: 16 mg/dL (ref 6–24)
Bilirubin Total: 0.3 mg/dL (ref 0.0–1.2)
CO2: 24 mmol/L (ref 20–29)
Calcium: 9.4 mg/dL (ref 8.7–10.2)
Chloride: 103 mmol/L (ref 96–106)
Globulin, Total: 2.2 g/dL (ref 1.5–4.5)
Glucose: 85 mg/dL (ref 70–99)
Potassium: 2.9 mmol/L — ABNORMAL LOW (ref 3.5–5.2)
Sodium: 142 mmol/L (ref 134–144)
Total Protein: 6.4 g/dL (ref 6.0–8.5)

## 2022-04-07 LAB — CBC WITH DIFFERENTIAL/PLATELET
Basophils Absolute: 0 10*3/uL (ref 0.0–0.2)
Basos: 0 %
EOS (ABSOLUTE): 0.1 10*3/uL (ref 0.0–0.4)
Eos: 1 %
Hematocrit: 37.4 % (ref 34.0–46.6)
Hemoglobin: 12.4 g/dL (ref 11.1–15.9)
Immature Grans (Abs): 0 10*3/uL (ref 0.0–0.1)
Immature Granulocytes: 0 %
Lymphocytes Absolute: 2.7 10*3/uL (ref 0.7–3.1)
Lymphs: 52 %
MCH: 27 pg (ref 26.6–33.0)
MCHC: 33.2 g/dL (ref 31.5–35.7)
MCV: 81 fL (ref 79–97)
Monocytes Absolute: 0.3 10*3/uL (ref 0.1–0.9)
Monocytes: 5 %
Neutrophils Absolute: 2.2 10*3/uL (ref 1.4–7.0)
Neutrophils: 42 %
Platelets: 265 10*3/uL (ref 150–450)
RBC: 4.6 x10E6/uL (ref 3.77–5.28)
RDW: 16 % — ABNORMAL HIGH (ref 11.7–15.4)
WBC: 5.2 10*3/uL (ref 3.4–10.8)

## 2022-04-07 LAB — URINE CULTURE: Organism ID, Bacteria: NO GROWTH

## 2022-04-07 LAB — IRON AND TIBC
Iron Saturation: 28 % (ref 15–55)
Iron: 91 ug/dL (ref 27–159)
Total Iron Binding Capacity: 330 ug/dL (ref 250–450)
UIBC: 239 ug/dL (ref 131–425)

## 2022-04-07 LAB — KIDNEY PROFILE
Creatinine, Ser: 1.06 mg/dL — ABNORMAL HIGH (ref 0.57–1.00)
Creatinine, Urine: 130.5 mg/dL
Microalb/Creat Ratio: 10 mg/g creat (ref 0–29)
Microalbumin, Urine: 12.7 ug/mL
eGFR: 62 mL/min/{1.73_m2} (ref >59)

## 2022-04-07 LAB — LIPID PANEL W/O CHOL/HDL RATIO
Cholesterol, Total: 220 mg/dL — ABNORMAL HIGH (ref 100–199)
HDL: 39 mg/dL — ABNORMAL LOW (ref >39)
LDL Chol Calc (NIH): 153 mg/dL — ABNORMAL HIGH (ref 0–99)
Triglycerides: 151 mg/dL — ABNORMAL HIGH (ref 0–149)
VLDL Cholesterol Cal: 28 mg/dL (ref 5–40)

## 2022-04-07 LAB — B12 AND FOLATE PANEL
Folate: 9.8 ng/mL (ref >3.0)
Vitamin B-12: 1309 pg/mL — ABNORMAL HIGH (ref 232–1245)

## 2022-04-07 LAB — FERRITIN: Ferritin: 121 ng/mL (ref 15–150)

## 2022-04-07 LAB — VITAMIN D 25 HYDROXY (VIT D DEFICIENCY, FRACTURES): Vit D, 25-Hydroxy: 46.3 ng/mL (ref 30.0–100.0)

## 2022-04-07 LAB — ESTRADIOL: Estradiol: <5 pg/mL

## 2022-04-14 MED FILL — Iron Sucrose Inj 20 MG/ML (Fe Equiv): INTRAVENOUS | Qty: 10 | Status: AC

## 2022-04-17 ENCOUNTER — Inpatient Hospital Stay: Payer: Self-pay | Admitting: Internal Medicine

## 2022-04-17 ENCOUNTER — Inpatient Hospital Stay: Payer: Self-pay

## 2022-04-17 NOTE — Assessment & Plan Note (Deleted)
#   Anemia- Hb-11- symptomatic.  Likely due to iron deficiency - from etiology GI malabsorption/history of gastric bypass.  Patient has poor tolerance/lack of improvement on oral iron.    # Discussed regarding IV iron infusion/Venofer. Discussed the potential acute infusion reactions with IV iron; which are quite rare.  Patient understands the risk; will proceed with infusions.  Patient had previous infusion without any major side effects.  #Etiology of iron deficiency: Likely due to iron deficiency - from etiology GI malabsorption/history of gastric bypass.  Hold off further workup at this time.  # B12: JAn 2024- WNL- monitor for now.   # Hx of frequent UTIs/ ? CKD [Dr.David Ronlad; [facility]]-    # Severe vit D def- JULy 2023- vit D 8 [pcp]- recommend compliance with vit D 50,000/ weekly [script from PCP].   # DISPOSITION: # NO labs today # venofer weekly x 3 # follow up in 2 month-MD: labs- cbc;bmp;iron studies; ferritin; vit D 25-OH - possible venofer-Dr.B

## 2022-04-17 NOTE — Progress Notes (Deleted)
[facility] CONSULT NOTE  Patient Care Team: Pcp, No as PCP - General  CHIEF COMPLAINTS/PURPOSE OF CONSULTATION: ANEMIA   HEMATOLOGY HISTORY  # ANEMIA[Hb; MCV-platelets- WBC; Iron sat; ferritin;  GFR- CT/US- ;  EGD/colonoscopy-JUNE 2020-NEGATIVE [Dr.Toledo]-gastric bypass 2011 [[facility]]  # Hx of multiple UTIs/? Ureteral damage[2017] [s/p TAH 2007]-Prior blood transfusion: yes; > on 2017 sec to surgery [adhesions; ureter involved s/p TAH- 2007]   Latest Reference Range & Units 02/08/22 15:15  Iron 27 - 159 ug/dL 45  UIBC 384 - 536 ug/dL 468  TIBC 032 - 122 ug/dL 482  Ferritin 15 - 500 ng/mL 10 (L)  Iron Saturation 15 - 55 % 11 (L)  (L): Data is abnormally low   Latest Reference Range & Units 02/08/22 15:15  WBC 3.4 - 10.8 x10E3/uL 9.1  RBC 3.77 - 5.28 x10E6/uL 4.42  Hemoglobin 11.1 - 15.9 g/dL 37.0  HCT 48.8 - 89.1 % 35.0  MCV 79 - 97 fL 79  MCH 26.6 - 33.0 pg 25.8 (L)  MCHC 31.5 - 35.7 g/dL 69.4  RDW 50.3 - 88.8 % 13.9  Platelets 150 - 450 x10E3/uL 265  (L): Data is abnormally low  HISTORY OF PRESENTING ILLNESS: Ambulating independently.  Accompanied by her husband.  Vickie Turner 52 y.o.  female pleasant patient with prior history of gastric bypass-iron malabsorption is here for follow-up.  Patient received IV iron infusion approximately 3 months ago...   Review of Systems  Constitutional:  Positive for malaise/fatigue. Negative for chills, diaphoresis, fever and weight loss.  HENT:  Negative for nosebleeds and sore throat.   Eyes:  Negative for double vision.  Respiratory:  Negative for cough, hemoptysis, sputum production, shortness of breath and wheezing.   Cardiovascular:  Negative for chest pain, palpitations, orthopnea and leg swelling.  Gastrointestinal:  Positive for constipation. Negative for abdominal pain, blood in stool, diarrhea, heartburn, melena, nausea and vomiting.  Genitourinary:  Negative for dysuria, frequency and urgency.   Musculoskeletal:  Positive for myalgias. Negative for back pain and joint pain.  Skin: Negative.  Negative for itching and rash.  Neurological:  Positive for dizziness. Negative for tingling, focal weakness, weakness and headaches.  Endo/Heme/Allergies:  Does not bruise/bleed easily.  Psychiatric/Behavioral:  Negative for depression. The patient is not nervous/anxious and does not have insomnia.      MEDICAL HISTORY:  Past Medical History:  Diagnosis Date   Anemia    BRCA negative 11/2016   MyRisk neg, except BRCA 1 VUS; IBIS=5.2%   Chronic kidney disease    Family history of ovarian cancer    Urinary (tract) obstruction     SURGICAL HISTORY: Past Surgical History:  Procedure Laterality Date   ABDOMINAL HYSTERECTOMY     APPENDECTOMY     BACK SURGERY     CHOLECYSTECTOMY     COLONOSCOPY WITH PROPOFOL N/A 06/26/2018   Procedure: COLONOSCOPY WITH PROPOFOL;  Surgeon: Toledo, Boykin Nearing, MD;  Location: [facility];  Service: Gastroenterology;  Laterality: N/A;  NO INTERPRETER NECESSARY   ESOPHAGOGASTRODUODENOSCOPY (EGD) WITH PROPOFOL N/A 06/26/2018   Procedure: ESOPHAGOGASTRODUODENOSCOPY (EGD) WITH PROPOFOL;  Surgeon: Toledo, Boykin Nearing, MD;  Location: [facility];  Service: Gastroenterology;  Laterality: N/A;  NO INTERPRETER NECESSARY   PERIPHERAL VASCULAR CATHETERIZATION  10/02/2014   Procedure: PICC Line Insertion;  Surgeon: Annice Needy, MD;  Location: [facility];  Service: Cardiovascular;;   TONSILLECTOMY Bilateral     SOCIAL HISTORY: Social History   Socioeconomic History   Marital status: Married  Spouse name: Not on file   Number of children: Not on file   Years of education: Not on file   Highest education level: Not on file  Occupational History   Not on file  Tobacco Use   Smoking status: Never   Smokeless tobacco: Never  Vaping Use   Vaping Use: Never used  Substance and Sexual Activity   Alcohol use: No   Drug use: No   Sexual activity: Yes     Birth control/protection: Surgical    Comment: Hysterectomy  Other Topics Concern   Not on file  Social History Narrative   Not on file   Social Determinants of Health   Financial Resource Strain: Not on file  Food Insecurity: No Food Insecurity (02/13/2022)   Hunger Vital Sign    Worried About Running Out of Food in the Last Year: Never true    Ran Out of Food in the Last Year: Never true  Transportation Needs: No Transportation Needs (02/13/2022)   PRAPARE - Administrator, Civil Service (Medical): No    Lack of Transportation (Non-Medical): No  Physical Activity: Not on file  Stress: Not on file  Social Connections: Not on file  Intimate Partner Violence: Not At Risk (02/13/2022)   Humiliation, Afraid, Rape, and Kick questionnaire    Fear of Current or Ex-Partner: No    Emotionally Abused: No    Physically Abused: No    Sexually Abused: No    FAMILY HISTORY: Family History  Problem Relation Age of Onset   Ovarian cancer Sister 54       pat 1/2 sister   Cancer Sister 44       pat 1/2 sister--hepatobiliary   Heart Problems Brother    Prostate cancer Brother 45       pat 1/2 brother   Lung cancer Brother    Kidney cancer Maternal Uncle 50   Anesthesia problems Paternal Uncle     ALLERGIES:  is allergic to sulfa antibiotics, other, and tape.  MEDICATIONS:  Current Outpatient Medications  Medication Sig Dispense Refill   acetaminophen (TYLENOL) 325 MG tablet Take 2 tablets (650 mg total) by mouth every 6 (six) hours as needed for mild pain, moderate pain, fever or headache (headache).     estradiol (ESTRACE) 0.5 MG tablet Take one tab po qd for hormones (Patient not taking: Reported on 02/13/2022) 90 tablet 1   fludrocortisone (FLORINEF) 0.1 MG tablet Take one tab 2  a day as need for low blood pressure (Patient not taking: Reported on 02/13/2022) 60 tablet 3   nitrofurantoin, macrocrystal-monohydrate, (MACROBID) 100 MG capsule Take 100 mg by mouth 2 (two) times  daily.     omeprazole (PRILOSEC) 40 MG capsule Take 40 mg by mouth daily.     pantoprazole (PROTONIX) 40 MG tablet Take 1 tablet (40 mg total) by mouth 2 (two) times daily. (Patient not taking: Reported on 02/13/2022) 60 tablet 0   potassium chloride (KLOR-CON) 8 MEQ tablet Take 1 tablet (8 mEq total) by mouth 2 (two) times daily. (Patient not taking: Reported on 02/13/2022) 60 tablet 3   No current facility-administered medications for this visit.    PHYSICAL EXAMINATION:   There were no vitals filed for this visit.  There were no vitals filed for this visit.   Physical Exam Vitals and nursing note reviewed.  HENT:     Head: Normocephalic and atraumatic.     Mouth/Throat:     Pharynx: Oropharynx is clear.  Eyes:     Extraocular Movements: Extraocular movements intact.     Pupils: Pupils are equal, round, and reactive to light.  Cardiovascular:     Rate and Rhythm: Normal rate and regular rhythm.  Pulmonary:     Comments: Decreased breath sounds bilaterally.  Abdominal:     Palpations: Abdomen is soft.  Musculoskeletal:        General: Normal range of motion.     Cervical back: Normal range of motion.  Skin:    General: Skin is warm.  Neurological:     General: No focal deficit present.     Mental Status: She is alert and oriented to person, place, and time.  Psychiatric:        Behavior: Behavior normal.        Judgment: Judgment normal.      LABORATORY DATA:  I have reviewed the data as listed Lab Results  Component Value Date   WBC 5.2 04/05/2022   HGB 12.4 04/05/2022   HCT 37.4 04/05/2022   MCV 81 04/05/2022   PLT 265 04/05/2022   Recent Labs    05/10/21 1924 05/31/21 1551 01/04/22 1352 01/12/22 1357 02/08/22 1515 03/01/22 0949 03/13/22 1044 04/05/22 1544  NA 137   < > 143   < > 140 140  --  142  K 2.6*   < > 3.4*   < > 3.0* 3.5 3.3* 2.9*  CL 101   < > 109   < > 101 102  --  103  CO2 28   < > 28   < > 24 24  --  24  GLUCOSE 154*   < > 82   < > 89  82  --  85  BUN 15   < > 16   < > 13 18  --  16  CREATININE 0.67   < > 0.95   < > 1.10* 1.17* 0.95 1.06*  CALCIUM 9.0   < > 9.0   < > 9.0 9.6  --  9.4  GFRNONAA >60  --  >60  --   --   --   --   --   PROT 6.9   < >  --    < > 6.4 6.0  --  6.4  ALBUMIN 3.9   < >  --    < > 4.1 4.1  --  4.2  AST 24   < >  --    < > 13 20  --  25  ALT 16   < >  --    < > 6 15  --  21  ALKPHOS 114   < >  --    < > 79 83  --  96  BILITOT 0.5   < >  --    < > 0.3 0.3  --  0.3   < > = values in this interval not displayed.     No results found.  ASSESSMENT & PLAN:   No problem-specific Assessment & Plan notes found for this encounter.    All questions were answered. The patient knows to call the clinic with any problems, questions or concerns.    Earna CoderGovinda R Dhana Totton, MD 04/17/2022 2:14 PM

## 2022-05-08 ENCOUNTER — Other Ambulatory Visit: Payer: Self-pay | Admitting: Internal Medicine

## 2022-05-11 LAB — URINALYSIS, ROUTINE W REFLEX MICROSCOPIC
Bilirubin, UA: NEGATIVE
Glucose, UA: NEGATIVE
Ketones, UA: NEGATIVE
Nitrite, UA: POSITIVE — AB
RBC, UA: NEGATIVE
Specific Gravity, UA: 1.012 (ref 1.005–1.030)
Urobilinogen, Ur: 0.2 mg/dL (ref 0.2–1.0)
pH, UA: 6.5 (ref 5.0–7.5)

## 2022-05-11 LAB — MICROSCOPIC EXAMINATION
Casts: NONE SEEN /lpf
Epithelial Cells (non renal): NONE SEEN /hpf (ref 0–10)
RBC, Urine: NONE SEEN /hpf (ref 0–2)
WBC, UA: >30 /hpf — AB (ref 0–5)

## 2022-05-11 LAB — URINE CULTURE

## 2022-05-22 ENCOUNTER — Other Ambulatory Visit: Payer: Self-pay | Admitting: Family Medicine

## 2022-05-24 LAB — URINALYSIS, ROUTINE W REFLEX MICROSCOPIC
Bilirubin, UA: NEGATIVE
Glucose, UA: NEGATIVE
Ketones, UA: NEGATIVE
Nitrite, UA: NEGATIVE
Protein,UA: NEGATIVE
RBC, UA: NEGATIVE
Specific Gravity, UA: 1.011 (ref 1.005–1.030)
Urobilinogen, Ur: 0.2 mg/dL (ref 0.2–1.0)
pH, UA: 5.5 (ref 5.0–7.5)

## 2022-05-24 LAB — MICROSCOPIC EXAMINATION
Bacteria, UA: NONE SEEN
Casts: NONE SEEN /lpf
RBC, Urine: NONE SEEN /hpf (ref 0–2)

## 2022-05-24 LAB — IRON AND TIBC
Iron Saturation: 32 % (ref 15–55)
Iron: 87 ug/dL (ref 27–159)
Total Iron Binding Capacity: 273 ug/dL (ref 250–450)
UIBC: 186 ug/dL (ref 131–425)

## 2022-05-24 LAB — KIDNEY PROFILE
Creatinine, Ser: 1.21 mg/dL — ABNORMAL HIGH (ref 0.57–1.00)
Creatinine, Urine: 52.2 mg/dL
Microalb/Creat Ratio: 25 mg/g creat (ref 0–29)
Microalbumin, Urine: 12.8 ug/mL
eGFR: 53 mL/min/{1.73_m2} — ABNORMAL LOW (ref >59)

## 2022-05-24 LAB — URINE CULTURE: Organism ID, Bacteria: NO GROWTH

## 2022-07-20 ENCOUNTER — Institutional Professional Consult (permissible substitution): Payer: Self-pay | Admitting: Plastic Surgery

## 2022-08-22 ENCOUNTER — Other Ambulatory Visit: Payer: Self-pay | Admitting: Family Medicine

## 2022-08-24 ENCOUNTER — Institutional Professional Consult (permissible substitution): Payer: Self-pay | Admitting: Plastic Surgery

## 2022-09-07 ENCOUNTER — Institutional Professional Consult (permissible substitution): Payer: Self-pay | Admitting: Plastic Surgery

## 2022-09-07 ENCOUNTER — Encounter: Payer: Self-pay | Admitting: Plastic Surgery

## 2022-09-07 ENCOUNTER — Ambulatory Visit (INDEPENDENT_AMBULATORY_CARE_PROVIDER_SITE_OTHER): Payer: Self-pay | Admitting: Plastic Surgery

## 2022-09-07 DIAGNOSIS — M952 Other acquired deformity of head: Secondary | ICD-10-CM

## 2022-09-07 DIAGNOSIS — Q178 Other specified congenital malformations of ear: Secondary | ICD-10-CM

## 2022-09-27 ENCOUNTER — Other Ambulatory Visit: Payer: Self-pay | Admitting: Family Medicine

## 2022-09-29 LAB — KIDNEY PROFILE
Creatinine, Ser: 1.1 mg/dL — ABNORMAL HIGH (ref 0.57–1.00)
Creatinine, Urine: 58.9 mg/dL
Microalb/Creat Ratio: 17 mg/g creat (ref 0–29)
Microalbumin, Urine: 9.9 ug/mL
eGFR: 60 mL/min/{1.73_m2} (ref >59)

## 2022-09-29 LAB — URINALYSIS, ROUTINE W REFLEX MICROSCOPIC
Bilirubin, UA: NEGATIVE
Glucose, UA: NEGATIVE
Ketones, UA: NEGATIVE
Nitrite, UA: POSITIVE — AB
Protein,UA: NEGATIVE
RBC, UA: NEGATIVE
Specific Gravity, UA: 1.011 (ref 1.005–1.030)
Urobilinogen, Ur: 0.2 mg/dL (ref 0.2–1.0)
pH, UA: 6.5 (ref 5.0–7.5)

## 2022-09-29 LAB — URINE CULTURE

## 2022-09-29 LAB — MICROSCOPIC EXAMINATION
Casts: NONE SEEN /lpf
RBC, Urine: NONE SEEN /hpf (ref 0–2)

## 2022-10-09 ENCOUNTER — Encounter: Payer: Self-pay | Admitting: Plastic Surgery

## 2022-10-09 NOTE — Progress Notes (Signed)
Referring Provider No referring provider defined for this encounter.   CC:  Chief Complaint  Patient presents with   Consult           Vickie Turner is an 52 y.o. female.  HPI: Vickie Turner presents for evaluation of torn earlobes.  She is interested in earlobe repair.  Allergies  Allergen Reactions   Sulfa Antibiotics Rash and Shortness Of Breath   Other     Pork- religion contradiction     Tape     Other reaction(s): Other (See Comments) PLASTIC MEDICAL TAPE, Unknown Kind of Tape    Outpatient Encounter Medications as of 09/07/2022  Medication Sig   acetaminophen (TYLENOL) 325 MG tablet Take 2 tablets (650 mg total) by mouth every 6 (six) hours as needed for mild pain, moderate pain, fever or headache (headache).   estradiol (ESTRACE) 0.5 MG tablet Take one tab po qd for hormones   fludrocortisone (FLORINEF) 0.1 MG tablet Take one tab 2  a day as need for low blood pressure   nitrofurantoin, macrocrystal-monohydrate, (MACROBID) 100 MG capsule Take 100 mg by mouth 2 (two) times daily.   omeprazole (PRILOSEC) 40 MG capsule Take 40 mg by mouth daily.   pantoprazole (PROTONIX) 40 MG tablet Take 1 tablet (40 mg total) by mouth 2 (two) times daily.   potassium chloride (KLOR-CON) 8 MEQ tablet Take 1 tablet (8 mEq total) by mouth 2 (two) times daily.   No facility-administered encounter medications on file as of 09/07/2022.     Past Medical History:  Diagnosis Date   Anemia    BRCA negative 11/2016   MyRisk neg, except BRCA 1 VUS; IBIS=5.2%   Chronic kidney disease    Family history of ovarian cancer    Urinary (tract) obstruction     Past Surgical History:  Procedure Laterality Date   ABDOMINAL HYSTERECTOMY     APPENDECTOMY     BACK SURGERY     CHOLECYSTECTOMY     COLONOSCOPY WITH PROPOFOL N/A 06/26/2018   Procedure: COLONOSCOPY WITH PROPOFOL;  Surgeon: Toledo, Boykin Nearing, MD;  Location: [facility] ENDOSCOPY;  Service: Gastroenterology;  Laterality: N/A;  NO INTERPRETER  NECESSARY   ESOPHAGOGASTRODUODENOSCOPY (EGD) WITH PROPOFOL N/A 06/26/2018   Procedure: ESOPHAGOGASTRODUODENOSCOPY (EGD) WITH PROPOFOL;  Surgeon: Toledo, Boykin Nearing, MD;  Location: [facility] ENDOSCOPY;  Service: Gastroenterology;  Laterality: N/A;  NO INTERPRETER NECESSARY   PERIPHERAL VASCULAR CATHETERIZATION  10/02/2014   Procedure: PICC Line Insertion;  Surgeon: Annice Needy, MD;  Location: [facility] INVASIVE CV LAB;  Service: Cardiovascular;;   TONSILLECTOMY Bilateral     Family History  Problem Relation Age of Onset   Ovarian cancer Sister 68       pat 1/2 sister   Cancer Sister 18       pat 1/2 sister--hepatobiliary   Heart Problems Brother    Prostate cancer Brother 45       pat 1/2 brother   Lung cancer Brother    Kidney cancer Maternal Uncle 50   Anesthesia problems Paternal Uncle     Social History   Social History Narrative   Not on file     Review of Systems General: Denies fevers, chills, weight loss CV: Denies chest pain, shortness of breath, palpitations Earlobes: Holes in the earlobe from stretching due to wearing her rings  Physical Exam    09/07/2022    2:20 PM 03/13/2022   11:41 AM 03/13/2022   11:16 AM  Vitals with BMI  Height 5\' 8"   Weight 152 lbs    BMI 23.12    Systolic 103 112 782  Diastolic 68 82 61  Pulse 77 70 74    General:  No acute distress,  Alert and oriented, Non-Toxic, Normal speech and affect Ears: Patient has holes from her ear piercings which have stretched.  They are not torn all the way through at this time. Mammogram: Not applicable Assessment/Plan Acquired defect, earlobes: Discussed repair of the earlobes with the patient.  She understands that repair is possible however there will be a scar and it may not be possible to repierce the ears in the same place.  She is not sure that she wants to undergo repair at this time.  She will let me know if she would like to proceed.  Santiago Glad 10/09/2022, 8:16 AM

## 2023-01-05 ENCOUNTER — Other Ambulatory Visit (INDEPENDENT_AMBULATORY_CARE_PROVIDER_SITE_OTHER): Payer: Self-pay | Admitting: Gastroenterology

## 2023-01-05 ENCOUNTER — Encounter (INDEPENDENT_AMBULATORY_CARE_PROVIDER_SITE_OTHER): Payer: Self-pay | Admitting: Gastroenterology

## 2023-01-05 DIAGNOSIS — R2242 Localized swelling, mass and lump, left lower limb: Secondary | ICD-10-CM

## 2023-01-07 LAB — CBC WITH DIFFERENTIAL/PLATELET
Basophils Absolute: 0 10*3/uL (ref 0.0–0.2)
Basos: 1 %
EOS (ABSOLUTE): 0.1 10*3/uL (ref 0.0–0.4)
Eos: 2 %
Hematocrit: 36 % (ref 34.0–46.6)
Hemoglobin: 11.5 g/dL (ref 11.1–15.9)
Immature Grans (Abs): 0 10*3/uL (ref 0.0–0.1)
Immature Granulocytes: 0 %
Lymphocytes Absolute: 2.9 10*3/uL (ref 0.7–3.1)
Lymphs: 47 %
MCH: 28.4 pg (ref 26.6–33.0)
MCHC: 31.9 g/dL (ref 31.5–35.7)
MCV: 89 fL (ref 79–97)
Monocytes Absolute: 0.4 10*3/uL (ref 0.1–0.9)
Monocytes: 6 %
Neutrophils Absolute: 2.8 10*3/uL (ref 1.4–7.0)
Neutrophils: 44 %
Platelets: 212 10*3/uL (ref 150–450)
RBC: 4.05 x10E6/uL (ref 3.77–5.28)
RDW: 12.8 % (ref 11.7–15.4)
WBC: 6.3 10*3/uL (ref 3.4–10.8)

## 2023-01-07 LAB — LIPID PANEL W/O CHOL/HDL RATIO
Cholesterol, Total: 208 mg/dL — ABNORMAL HIGH (ref 100–199)
HDL: 46 mg/dL (ref >39)
LDL Chol Calc (NIH): 139 mg/dL — ABNORMAL HIGH (ref 0–99)
Triglycerides: 128 mg/dL (ref 0–149)
VLDL Cholesterol Cal: 23 mg/dL (ref 5–40)

## 2023-01-07 LAB — URINALYSIS, ROUTINE W REFLEX MICROSCOPIC
Bilirubin, UA: NEGATIVE
Glucose, UA: NEGATIVE
Ketones, UA: NEGATIVE
Nitrite, UA: POSITIVE — AB
Specific Gravity, UA: 1.019 (ref 1.005–1.030)
Urobilinogen, Ur: 1 mg/dL (ref 0.2–1.0)
pH, UA: 5.5 (ref 5.0–7.5)

## 2023-01-07 LAB — MICROSCOPIC EXAMINATION
Bacteria, UA: NONE SEEN
Casts: NONE SEEN /[LPF]

## 2023-01-07 LAB — SEDIMENTATION RATE: Sed Rate: 7 mm/h (ref 0–40)

## 2023-01-07 LAB — URINE CULTURE: Organism ID, Bacteria: NO GROWTH

## 2023-01-07 LAB — URIC ACID: Uric Acid: 5.1 mg/dL (ref 3.0–7.2)

## 2023-01-09 ENCOUNTER — Other Ambulatory Visit (INDEPENDENT_AMBULATORY_CARE_PROVIDER_SITE_OTHER): Payer: Self-pay | Admitting: Gastroenterology

## 2023-01-09 ENCOUNTER — Encounter (INDEPENDENT_AMBULATORY_CARE_PROVIDER_SITE_OTHER): Payer: Self-pay | Admitting: Gastroenterology

## 2023-01-09 DIAGNOSIS — R2242 Localized swelling, mass and lump, left lower limb: Secondary | ICD-10-CM

## 2023-01-09 LAB — KIDNEY PROFILE
Creatinine, Ser: 0.88 mg/dL (ref 0.57–1.00)
Creatinine, Urine: 89.3 mg/dL
Microalb/Creat Ratio: 19 mg/g{creat} (ref 0–29)
Microalbumin, Urine: 17.3 ug/mL
eGFR: 78 mL/min/{1.73_m2} (ref >59)

## 2023-01-09 LAB — SPECIMEN STATUS REPORT

## 2023-01-29 ENCOUNTER — Telehealth: Payer: Self-pay | Admitting: *Deleted

## 2023-03-06 ENCOUNTER — Other Ambulatory Visit: Payer: Self-pay

## 2023-03-06 DIAGNOSIS — Z1231 Encounter for screening mammogram for malignant neoplasm of breast: Secondary | ICD-10-CM

## 2023-03-08 ENCOUNTER — Emergency Department
Admission: EM | Admit: 2023-03-08 | Discharge: 2023-03-08 | Disposition: A | Discharge status: Home / Self Care | Payer: Self-pay | Attending: Emergency Medicine | Admitting: Emergency Medicine

## 2023-03-08 ENCOUNTER — Encounter: Payer: Self-pay | Admitting: Emergency Medicine

## 2023-03-08 ENCOUNTER — Emergency Department: Payer: Self-pay

## 2023-03-08 ENCOUNTER — Other Ambulatory Visit: Payer: Self-pay

## 2023-03-08 DIAGNOSIS — M25511 Pain in right shoulder: Secondary | ICD-10-CM | POA: Insufficient documentation

## 2023-03-08 MED ORDER — HYDROCODONE-ACETAMINOPHEN 5-325 MG PO TABS: 1.0000 | ORAL_TABLET | ORAL | 0 refills | Status: DC | PRN

## 2023-03-08 MED ORDER — HYDROCODONE-ACETAMINOPHEN 5-325 MG PO TABS
1.0000 | ORAL_TABLET | Freq: Once | ORAL | Status: AC
  Administered 2023-03-08: 1 via ORAL
  Filled 2023-03-08: qty 1

## 2023-03-08 NOTE — ED Provider Notes (Signed)
 [facility] Emergency Department Provider Note     Event Date/Time   First MD Initiated Contact with Patient 03/08/23 1056     (approximate)   History   Shoulder Pain   HPI  Vickie Turner is a 53 y.o. female presents to the ED for evaluation of right shoulder pain x 1 day.  Patient reports picking up groceries yesterday when she felt a pop in her right shoulder.  Patient reports the groceries is not heavy however she does have a history of a rotator cuff tear that was not treated in the past.      Physical Exam   Triage Vital Signs: ED Triage Vitals picking up  Encounter Vitals Group     BP 116/62     Systolic BP Percentile      Diastolic BP Percentile      Pulse Rate 67     Resp 18     Temp 97.7 F (36.5 C)     Temp Source Oral     SpO2 100 %     Weight 150 lb (68 kg)     Height 5\' 8"  (1.727 m)     Head Circumference      Peak Flow      Pain Score 8     Pain Loc      Pain Education      Exclude from Growth Chart     Most recent vital signs: Vitals:   03/08/23 1001  BP: 116/62  Pulse: 67  Resp: 18  Temp: 97.7 F (36.5 C)  SpO2: 100%    General Awake, no distress.  HEENT NCAT.  CV:  Good peripheral perfusion.  RESP:  Normal effort.  ABD:  No distention.  Other:  No visible deformity of right shoulder.  Patient has shoulder and Ace wrap and sling made from a scarf.  Neurovascular status intact all throughout nerve distributions.  Radial pulse palpated and is equal and symmetric bilaterally.  Good capillary refill.  Tenderness over anterior lateral glenohumeral head.   ED Results / Procedures / Treatments   Labs (all labs ordered are listed, but only abnormal results are displayed) Labs Reviewed - No data to display  RADIOLOGY  I personally viewed and evaluated these images as part of my medical decision making, as well as reviewing the written report by the radiologist.  ED Provider Interpretation: Right shoulder is  unremarkable.  No bony abnormality.  DG Shoulder Right Result Date: 03/08/2023 CLINICAL DATA:  Acute right shoulder pain after possible injury yesterday. EXAM: RIGHT SHOULDER - 2+ VIEW COMPARISON:  September 08, 2016. FINDINGS: There is no evidence of fracture or dislocation. There is no evidence of arthropathy or other focal bone abnormality. Soft tissues are unremarkable. IMPRESSION: Negative. Electronically Signed   By: Lupita Raider M.D.   On: 03/08/2023 13:11    PROCEDURES:  Critical Care performed: No  Procedures   MEDICATIONS ORDERED IN ED: Medications  HYDROcodone-acetaminophen (NORCO/VICODIN) 5-325 MG per tablet 1 tablet (1 tablet Oral Given 03/08/23 1209)     IMPRESSION / MDM / ASSESSMENT AND PLAN / ED COURSE  I reviewed the triage vital signs and the nursing notes.                              Clinical Course as of 03/08/23 1512  Thu Mar 08, 2023  1306 Patient is wanting to leave before final xray  read.  [MH]    Clinical Course User Index [MH] Kern Reap A, PA-C    53 y.o. female presents to the emergency department for evaluation and treatment of right shoulder pain. See HPI for further details.   Differential diagnosis includes, but is not limited to strain, fracture, dislocation  Patient's presentation is most consistent with acute complicated illness / injury requiring diagnostic workup.  Patient is alert and oriented.  She is dynamically stable.  No deformity to her right shoulder.  Patient is requesting to leave prior to x-ray being resulted from the radiology because patient's expectation was to come to the ED for an MRI.  This was discussed that this was not the expectation in the ED.  Patient is wanting to go to [facility] prior to x-ray resulted.  Patient is placed in a sling and provided pain management.  I discussed with patient the importance for awaiting further x-ray but patient is adamant of seeking a MRI in which I believe is appropriate for  outpatient management.  Patient is a stable condition for discharge home.  ED return precautions discussed.  X-ray is unremarkable.   FINAL CLINICAL IMPRESSION(S) / ED DIAGNOSES   Final diagnoses:  Acute pain of right shoulder    Rx / DC Orders   ED Discharge Orders          Ordered    HYDROcodone-acetaminophen (NORCO/VICODIN) 5-325 MG tablet  Every 4 hours PRN,   Status:  Discontinued        03/08/23 1315    HYDROcodone-acetaminophen (NORCO/VICODIN) 5-325 MG tablet  Every 4 hours PRN        03/08/23 1316             Note:  This document was prepared using Dragon voice recognition software and may include unintentional dictation errors.    Romeo Apple, Elmer Boutelle A, PA-C 03/08/23 1515    Concha Se, MD 03/09/23 0730

## 2023-03-08 NOTE — Discharge Instructions (Addendum)
 Your evaluated in the ED for right shoulder pain.  Your x-ray is normal.  Been placed in a sling and urged to stay in the splint for comfort and limit your mobilization for 7 days.  Apply warm compress to the area. please follow-up with orthopedic for further evaluation.  Do not take Tylenol with medication prescribed.

## 2023-03-08 NOTE — ED Triage Notes (Signed)
 Patient to ED via POV for right shoulder/upper arm pain. Pt states she was carrying things yesterday when she heard a "pop." Pt has arm in a sling from home. Hx of old injury

## 2023-03-13 ENCOUNTER — Other Ambulatory Visit: Payer: Self-pay

## 2023-03-13 ENCOUNTER — Ambulatory Visit: Payer: Self-pay | Attending: Hematology and Oncology | Admitting: *Deleted

## 2023-03-13 ENCOUNTER — Ambulatory Visit
Admission: RE | Admit: 2023-03-13 | Discharge: 2023-03-13 | Disposition: A | Discharge status: Home / Self Care | Payer: Self-pay | Source: Ambulatory Visit | Attending: Obstetrics and Gynecology | Admitting: Obstetrics and Gynecology

## 2023-03-13 VITALS — BP 117/81 | Wt 151.2 lb

## 2023-03-13 DIAGNOSIS — Z1231 Encounter for screening mammogram for malignant neoplasm of breast: Secondary | ICD-10-CM | POA: Insufficient documentation

## 2023-03-13 DIAGNOSIS — Z01419 Encounter for gynecological examination (general) (routine) without abnormal findings: Secondary | ICD-10-CM

## 2023-03-13 NOTE — Progress Notes (Signed)
 [name] is a 53 y.o. [phone] female who presents to [facility] clinic today with no complaints.    Pap Smear: Pap smear completed today. Last Pap smear was 09/19/2019 at [facility] clinic and was normal. Per patient has no history of an abnormal Pap smear. Last Pap smear result is available in Epic.   Physical exam: Breasts Breasts symmetrical. No skin abnormalities bilateral breasts. No nipple retraction bilateral breasts. No nipple discharge bilateral breasts. No lymphadenopathy. No lumps palpated bilateral breasts. No complaints of pain or tenderness on exam.       Pelvic/Bimanual Ext Genitalia No lesions, no swelling and no discharge observed on external genitalia.        Vagina Vagina pink and normal texture. No lesions or discharge observed in vagina.        Cervix Cervix is present. Cervix pink and of normal texture. No discharge observed.    Uterus Uterus is absent due to history of supracervical hysterectomy for AUB.     Adnexae Bilateral adnexae absent due to history of supracervical hysterectomy for AUB and bilateral ovaries were removed per patient. No tenderness on palpation.         Rectovaginal No rectal exam completed today since patient had no rectal complaints. No skin abnormalities observed on exam.     Smoking History: Patient has never smoked.   Patient Navigation: Patient education provided. Access to services provided for patient through [facility] program.   Colorectal Cancer Screening: Per patient has had colonoscopy completed on 06/26/2018 at [facility].  No complaints today.    Breast and Cervical Cancer Risk Assessment: Patient has family history of first maternal cousin having breast cancer. Patient has no known genetic mutations or history of radiation treatment to the chest before age 72. Patient does not have history of cervical dysplasia, immunocompromised, or DES exposure in-utero.  Risk Scores as of Encounter on 03/13/2023      Vickie Turner           5-year 0.49%   Lifetime 2.55%            Last calculated by Narda Rutherford, LPN on 06/12/4032 at 10:01 AM       A: [facility] exam with pap smear No complaints.  P: Referred patient to the [facility] for a screening mammogram. Appointment scheduled Tuesday, March 13, 2023 at 1020.  Priscille Heidelberg, RN 03/13/2023 9:41 AM

## 2023-03-13 NOTE — Patient Instructions (Signed)
 Explained breast self awareness with Rayya A Cassity. Pap smear completed today. Let her know [facility] will cover Pap smears and HPV typing every 5 years unless has a history of abnormal Pap smears. Referred patient to the [facility] for a screening mammogram. Appointment scheduled Tuesday, March 13, 2023 at 1020. Patient aware of appointment and will be there. Let patient know will follow up with her within the next couple weeks with results of Pap smear by letter or phone. Informed patient that Delford Field will follow up with her within the next couple of weeks with results of her mammogram by letter or phone. Harryette A Thomann verbalized understanding.  Lamoyne Palencia, Kathaleen Maser, RN 9:41 AM

## 2023-03-16 ENCOUNTER — Encounter: Payer: Self-pay | Admitting: Obstetrics & Gynecology

## 2023-03-16 LAB — CYTOLOGY - PAP
Comment: NEGATIVE
Diagnosis: NEGATIVE
High risk HPV: NEGATIVE

## 2023-03-19 ENCOUNTER — Encounter: Payer: Self-pay | Admitting: Obstetrics & Gynecology

## 2023-03-30 ENCOUNTER — Encounter (INDEPENDENT_AMBULATORY_CARE_PROVIDER_SITE_OTHER): Payer: Self-pay | Admitting: Gastroenterology

## 2023-03-30 MED ORDER — METHOCARBAMOL 750 MG PO TABS: 750.0000 mg | ORAL_TABLET | Freq: Four times a day (QID) | ORAL | 1 refills | Status: DC

## 2023-07-28 ENCOUNTER — Other Ambulatory Visit: Payer: Self-pay

## 2023-07-28 ENCOUNTER — Emergency Department: Payer: Self-pay

## 2023-07-28 ENCOUNTER — Emergency Department
Admission: EM | Admit: 2023-07-28 | Discharge: 2023-07-28 | Disposition: A | Discharge status: Home / Self Care | Payer: Self-pay

## 2023-07-28 DIAGNOSIS — W01198A Fall on same level from slipping, tripping and stumbling with subsequent striking against other object, initial encounter: Secondary | ICD-10-CM | POA: Insufficient documentation

## 2023-07-28 DIAGNOSIS — R55 Syncope and collapse: Secondary | ICD-10-CM | POA: Insufficient documentation

## 2023-07-28 DIAGNOSIS — W19XXXA Unspecified fall, initial encounter: Secondary | ICD-10-CM

## 2023-07-28 DIAGNOSIS — R519 Headache, unspecified: Secondary | ICD-10-CM | POA: Insufficient documentation

## 2023-07-28 DIAGNOSIS — R109 Unspecified abdominal pain: Secondary | ICD-10-CM | POA: Insufficient documentation

## 2023-07-28 DIAGNOSIS — M542 Cervicalgia: Secondary | ICD-10-CM | POA: Insufficient documentation

## 2023-07-28 DIAGNOSIS — S20212A Contusion of left front wall of thorax, initial encounter: Secondary | ICD-10-CM | POA: Insufficient documentation

## 2023-07-28 LAB — CBG MONITORING, ED: Glucose-Capillary: 99 mg/dL (ref 70–99)

## 2023-07-28 LAB — CBC
HCT: 34 % — ABNORMAL LOW (ref 36.0–46.0)
Hemoglobin: 11.7 g/dL — ABNORMAL LOW (ref 12.0–15.0)
MCH: 28.8 pg (ref 26.0–34.0)
MCHC: 34.4 g/dL (ref 30.0–36.0)
MCV: 83.7 fL (ref 80.0–100.0)
Platelets: 208 K/uL (ref 150–400)
RBC: 4.06 MIL/uL (ref 3.87–5.11)
RDW: 12.7 % (ref 11.5–15.5)
WBC: 6.1 K/uL (ref 4.0–10.5)
nRBC: 0 % (ref 0.0–0.2)

## 2023-07-28 LAB — COMPREHENSIVE METABOLIC PANEL WITH GFR
ALT: 18 U/L (ref 0–44)
AST: 24 U/L (ref 15–41)
Albumin: 3.7 g/dL (ref 3.5–5.0)
Alkaline Phosphatase: 106 U/L (ref 38–126)
Anion gap: 11 (ref 5–15)
BUN: 13 mg/dL (ref 6–20)
CO2: 26 mmol/L (ref 22–32)
Calcium: 8.7 mg/dL — ABNORMAL LOW (ref 8.9–10.3)
Chloride: 104 mmol/L (ref 98–111)
Creatinine, Ser: 0.82 mg/dL (ref 0.44–1.00)
GFR, Estimated: >60 mL/min (ref >60)
Glucose, Bld: 98 mg/dL (ref 70–99)
Potassium: 3.4 mmol/L — ABNORMAL LOW (ref 3.5–5.1)
Sodium: 141 mmol/L (ref 135–145)
Total Bilirubin: 0.6 mg/dL (ref 0.0–1.2)
Total Protein: 6.6 g/dL (ref 6.5–8.1)

## 2023-07-28 LAB — TROPONIN I (HIGH SENSITIVITY): Troponin I (High Sensitivity): 6 ng/L (ref <18)

## 2023-07-28 LAB — T4, FREE: Free T4: 0.8 ng/dL (ref 0.61–1.12)

## 2023-07-28 LAB — MAGNESIUM: Magnesium: 2.1 mg/dL (ref 1.7–2.4)

## 2023-07-28 LAB — TSH: TSH: 1.53 u[IU]/mL (ref 0.350–4.500)

## 2023-07-28 MED ORDER — MORPHINE SULFATE (PF) 4 MG/ML IV SOLN
4.0000 mg | Freq: Once | INTRAVENOUS | Status: AC
  Administered 2023-07-28: 4 mg via INTRAVENOUS
  Filled 2023-07-28: qty 1

## 2023-07-28 MED ORDER — ONDANSETRON HCL 4 MG/2ML IJ SOLN
4.0000 mg | Freq: Once | INTRAMUSCULAR | Status: AC
  Administered 2023-07-28: 4 mg via INTRAVENOUS
  Filled 2023-07-28: qty 2

## 2023-07-28 MED ORDER — IOHEXOL 300 MG/ML  SOLN
100.0000 mL | Freq: Once | INTRAMUSCULAR | Status: AC | PRN
  Administered 2023-07-28: 100 mL via INTRAVENOUS

## 2023-07-28 MED ORDER — OXYCODONE-ACETAMINOPHEN 5-325 MG PO TABS: 1.0000 | ORAL_TABLET | ORAL | 0 refills | Status: AC | PRN

## 2023-07-28 MED ORDER — ONDANSETRON 4 MG PO TBDP
4.0000 mg | ORAL_TABLET | Freq: Three times a day (TID) | ORAL | 0 refills | Status: AC | PRN
Start: 2023-07-28 — End: ?

## 2023-07-28 MED ORDER — METHOCARBAMOL 500 MG PO TABS: 500.0000 mg | ORAL_TABLET | Freq: Three times a day (TID) | ORAL | 0 refills | Status: AC

## 2023-07-28 NOTE — ED Triage Notes (Signed)
 Patient reports syncopal episode last night; believes from hypoglycemic episode; reports hx of the same. Patient c/o left right cage and neck pain post fall.

## 2023-07-28 NOTE — ED Provider Notes (Signed)
 [facility] Provider Note    Event Date/Time   First MD Initiated Contact with Patient 07/28/23 1533     (approximate)   History   Loss of Consciousness   HPI Vickie Turner is a 53 y.o. female with past medical history of iron  deficiency anemia, gastric bypass, chronic cystitis on Macrobid  followed by infectious disease, intermittent use of hormones who presents to the emergency department with headache, neck pain left-sided chest pain and left-sided abdominal pain after syncope and fall yesterday evening.  Husband is with her who contributes to the history.  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  She felt lightheaded upon returning home.  While sitting in a chair in front of her son she complained of lightheadedness and suddenly syncopized.  She fell to the left side and did hit her head and the left part of her body.  She reportedly lost consciousness for less than a minute.  No generalized tonic-clonic movements were noticed.  There was no urinary or bowel incontinence.  When she regained consciousness she was only confused for 30 seconds and her confusion improved after the patient's family was able to get hold of something to eat.  She did not seek care immediately as this had happened previously on 1 occasion.  However the pain in her left neck left ribs and left abdomen has progressively worsened.  She was able to eat today.  She denies any shortness of breath but it does hurt to take a deep breath.  She is not on any anticoagulation.  She is is at her baseline mental status per her husband.  Of note, the patient does have oral hormones on home medication list but patient states that she has not taken them for over a month and only takes them as needed.   Physical Exam   Triage Vital Signs: ED Triage Vitals [07/28/23 1531]  Encounter Vitals Group     BP 135/83     Girls Systolic BP Percentile      Girls Diastolic BP  Percentile      Boys Systolic BP Percentile      Boys Diastolic BP Percentile      Pulse Rate 80     Resp 18     Temp 98.3 F (36.8 C)     Temp src      SpO2 100 %     Weight 150 lb (68 kg)     Height 5' 8 (1.727 m)     Head Circumference      Peak Flow      Pain Score 10     Pain Loc      Pain Education      Exclude from Growth Chart     Most recent vital signs: Vitals:   07/28/23 1621 07/28/23 1837  BP:  120/83  Pulse:  85  Resp:  19  Temp:  98.4 F (36.9 C)  SpO2: 99% 99%    Nursing Triage Note reviewed. Vital signs reviewed and patients oxygen saturation is normoxic  General: Patient is well nourished, well developed, awake and alert, appears to be in pain Head: Normocephalic, no scalp lacerations or abrasions Eyes: Normal inspection, extraocular muscles intact, no conjunctival pallor Ear, nose, throat: Normal external exam Neck: No deformity, only very mildly tender over c-spine, more ttp over left trapezius Respiratory: Patient is in no respiratory distress, lungs CTAB Cardiovascular: Patient is not tachycardic, RRR without murmur appreciated  Chest wall: Very ttp over left ribs 5-9, no crepitus, no visible ecchymosis GI: soft, very tender to palpation over the left upper quadrant and left lower quadrant no tender to palpation on the right, there is mild guarding but no rebound Back: Normal inspection of the back with good strength and range of motion throughout all ext Extremities: pulses intact with good cap refills, no LE pitting edema or calf tenderness Neuro: The patient is alert and oriented to person, place, and time, appropriately conversive, with 5/5 bilat UE/LE strength, no gross motor or sensory defects noted. Coordination appears to be adequate. Skin: Warm, dry, and intact Psych: normal mood and affect, no SI or HI  ED Results / Procedures / Treatments   Labs (all labs ordered are listed, but only abnormal results are displayed) Labs Reviewed   COMPREHENSIVE METABOLIC PANEL WITH GFR - Abnormal; Notable for the following components:      Result Value   Potassium 3.4 (*)    Calcium 8.7 (*)    All other components within normal limits  CBC - Abnormal; Notable for the following components:   Hemoglobin 11.7 (*)    HCT 34.0 (*)    All other components within normal limits  MAGNESIUM  TSH  T4, FREE  CBG MONITORING, ED  TROPONIN I (HIGH SENSITIVITY)     EKG EKG and rhythm strip are interpreted by myself:   EKG: [Normal sinus rhythm] at heart rate of 75, normal QRS duration, QTc 471, normal ST segments and T waves no ectopy EKG not consistent with Acute STEMI Rhythm strip: NSR in lead II No evidence of prolonged QTc, Brugada or HOCM   RADIOLOGY CT head: No intracranial hemorrhage, independently reviewed interpretation radiologist agrees CT C-spine: Unremarkable CT chest abdomen pelvis with IV contrast: Pulmonary nodules which patient and family were aware of, but no other acute traumatic injuries    PROCEDURES:  Critical Care performed: No  Procedures   MEDICATIONS ORDERED IN ED: Medications  morphine  (PF) 4 MG/ML injection 4 mg (4 mg Intravenous Given 07/28/23 1559)  ondansetron  (ZOFRAN ) injection 4 mg (4 mg Intravenous Given 07/28/23 1558)  iohexol  (OMNIPAQUE ) 300 MG/ML solution 100 mL (100 mLs Intravenous Contrast Given 07/28/23 1638)     IMPRESSION / MDM / ASSESSMENT AND PLAN / ED COURSE                                Differential diagnosis includes, but is not limited to, arrhythmia, hypoglycemia, orthostasis, intracranial hemorrhage, spinal fracture, rib fracture, pulmonary contusion, pneumothorax,'s spleen laceration  ED course: This is a very nice 53 year old female with gastric bypass, anemia who presents to the emergency department after syncope after not eating yesterday and fall with continued headache neck pain left chest pain left abdominal pain.  On arrival patient is satting 100% on room air.   EKG demonstrates no evidence of arrhythmia.  I did consider PE given the syncope but patient is satting 100% on room air and denies taking recently any hormones and I think this is unlikely in the setting of not eating the day before.  Patient's glucose is not low today.  She is in more pain than I would expect from a fall from a chair especially in her chest and abdomen.  Given this I will pursue full trauma imaging.  I have provided morphine  and zofran .  Will evaluate electrolytes and blood counts and even get a single heart marker.  Disposition is pending results of these studies   Clinical Course as of 07/29/23 0038  Sat Jul 28, 2023  1549 Glucose-Capillary: 99 Not hypoglycemic here [HD]  1656 Creatinine: 0.82 No AKI [HD]  1657 Comprehensive metabolic panel(!) Grossly unremarkable [HD]  1657 CBC(!) Slightly more anemic than baseline, but not at threshold to require antibiotics [HD]  1704 TSH: 1.530 wnl [HD]  1730 CT Cervical Spine Wo Contrast No acute fracture [HD]  1730 CT Head Wo Contrast No acute abnormality [HD]  1808 Troponin I (High Sensitivity): 6 Not elevated  [HD]  1824 I reviewed the results of the patient's workup with the patient and her husband.  They were given printouts of the CT reports including the incidental findings.  They will follow-up with primary care physician.  I will place an order for Zofran , methocarbamol  and Percocet to the patient's pharmacy of choice.  Will place an outpatient order for cardiology for syncope--echo, although my suspicion for syncope secondary to not eating all day.  Patient and husband feel comfortable returning home at this point and will return if any acutely worsening symptoms [HD]    Clinical Course User Index [HD] Nicholaus Rolland BRAVO, MD   At time of discharge there is no evidence of acute life, limb, vision, or fertility threat. Patient has stable vital signs, pain is well controlled, patient is ambulatory and p.o.  tolerant.  Discharge instructions were completed using the [employer] system. I would refer you to those at this time. All warnings prescriptions follow-up etc. were discussed in detail with the patient. Patient indicates understanding and is agreeable with this plan. All questions answered.  Patient is made aware that they may return to the emergency department for any worsening or new condition or for any other emergency.  Risk: 5 This patient has a high risk of morbidity due to further diagnostic testing or treatment. Rationale: This patient's evaluation and management involve a high risk of morbidity due to the potential severity of presenting symptoms, need for diagnostic testing, and/or initiation of treatment that may require close monitoring. The differential includes conditions with potential for significant deterioration or requiring escalation of care. Treatment decisions in the ED, including medication administration, procedural interventions, or disposition planning, reflect this level of risk. Additional Support: -- Drug therapy requiring intensive monitoring for toxicity [ ]  -- Decision regarding elective major surgery with idenitified patient or procedure risk factors [ ]  -- Decision regarding hospitalization or escalation of hospital-level care [ ]  -- Decision not to resuscitate or to de-escalate care because of poor prognosis [ ]  -- Parental controlled substances [ ]   COPA: 5 The patient has a severe exacerbation, progression, or side effect of treatment of the following illness/illnesses: []  OR  The patient has the following acute or chronic illness/injury that poses a possible threat to life or bodily function: [X] : The patient has a potentially serious acute condition or an acute exacerbation of a chronic illness requiring urgent evaluation and management in the Emergency Department. The clinical presentation necessitates immediate consideration of life-threatening or  function-threatening diagnoses, even if they are ultimately ruled out.  Data(2/3 categories following were performed): 5 I reviewed or ordered at least three unique tests, external notes, and/or the history required an independent historian as one of the three requirements as following: CBC, BMP, BNP, troponin, husband at bedside AND  I independently interpreted the following test: CT head OR  I discussed the management of the patient with the following external physician or qualified healthcare provider: []   Suggested E/M Coding Level: 5, 99285, This has been selected based on the 08/20/2021 CPT guidelines for E/M codes in the Emergency Department based on 2/3 of the CoPA, Data, and Risk.    FINAL CLINICAL IMPRESSION(S) / ED DIAGNOSES   Final diagnoses:  Fall, initial encounter  Nonintractable headache, unspecified chronicity pattern, unspecified headache type  Neck pain  Contusion of left chest wall, initial encounter  Abdominal pain, unspecified abdominal location  Syncope and collapse     Rx / DC Orders   ED Discharge Orders          Ordered    Ambulatory referral to Cardiology       Comments: If you have not heard from the Cardiology office within the next 72 hours please call [phone]. Needs echo, syncope workup as ouptatient.   07/28/23 1826    Ambulatory Referral to Primary Care (Establish Care)       Comments: No PCP listed, will need to ensure has outaptient appointment in next month   07/28/23 1827    methocarbamol  (ROBAXIN ) 500 MG tablet  3 times daily        07/28/23 1830    ondansetron  (ZOFRAN -ODT) 4 MG disintegrating tablet  Every 8 hours PRN        07/28/23 1830    oxyCODONE -acetaminophen  (PERCOCET) 5-325 MG tablet  Every 4 hours PRN        07/28/23 1830             Note:  This document was prepared using Dragon voice recognition software and may include unintentional dictation errors.   Nicholaus Rolland BRAVO, MD 07/29/23 [phone]

## 2023-07-28 NOTE — Discharge Instructions (Addendum)
[ ]   Continue to eat small snacks throughout the day, follow-up with your primary care physician and cardiologist, do not drive or operate machinery if you are feeling symptomatic, return with any acutely worsening symptoms or any other emergency -- RETURN PRECAUTIONS & AFTERCARE: (ENGLISH) RETURN PRECAUTIONS: Return immediately to the emergency department or see/call your doctor if you feel worse, weak or have changes in speech or vision, are short of breath, have fever, vomiting, pain, bleeding or dark stool, trouble urinating or any new issues. Return here or see/call your doctor if not improving as expected for your suspected condition. FOLLOW-UP CARE: Call your doctor and/or any doctors we referred you to for more advice and to make an appointment. Do this today, tomorrow or after the weekend. Some doctors only take PPO insurance so if you have HMO insurance you may want to contact your HMO or your regular doctor for referral to a specialist within your plan. Either way tell the doctor's office that it was a referral from the emergency department so you get the soonest possible appointment.  YOUR TEST RESULTS: Take result reports of any blood or urine tests, imaging tests and EKG's to your doctor and any referral doctor. Have any abnormal tests repeated. Your doctor or a referral doctor can let you know when this should be done. Also make sure your doctor contacts this hospital to get any test results that are not currently available such as cultures or special tests for infection and final imaging reports, which are often not available at the time you leave the ER but which may list additional important findings that are not documented on the preliminary report. BLOOD PRESSURE: If your blood pressure was greater than 120/80 have your blood pressure rechecked within 1 to 2 weeks. MEDICATION SIDE EFFECTS: Do not drive, walk, bike, take the bus, etc. if you have received or are being prescribed any sedating  medications such as those for pain or anxiety or certain antihistamines like Benadryl . If you have been give one of these here get a taxi home or have a friend drive you home. Ask your pharmacist to counsel you on potential side effects of any new medication

## 2023-08-02 ENCOUNTER — Emergency Department: Payer: Self-pay

## 2023-08-02 ENCOUNTER — Emergency Department
Admission: EM | Admit: 2023-08-02 | Discharge: 2023-08-02 | Disposition: A | Discharge status: Home / Self Care | Payer: Self-pay | Attending: Emergency Medicine | Admitting: Emergency Medicine

## 2023-08-02 ENCOUNTER — Other Ambulatory Visit: Payer: Self-pay

## 2023-08-02 ENCOUNTER — Encounter: Payer: Self-pay | Admitting: *Deleted

## 2023-08-02 DIAGNOSIS — I8002 Phlebitis and thrombophlebitis of superficial vessels of left lower extremity: Secondary | ICD-10-CM | POA: Insufficient documentation

## 2023-08-02 DIAGNOSIS — N189 Chronic kidney disease, unspecified: Secondary | ICD-10-CM | POA: Insufficient documentation

## 2023-08-02 NOTE — ED Provider Notes (Signed)
 [facility] Provider Note    Event Date/Time   First MD Initiated Contact with Patient 08/02/23 1546     (approximate)  History   Chief Complaint: Leg Pain  HPI  Vickie Turner is a 53 y.o. female with a past medical history of anemia, CKD, who presents to the emergency department for left leg pain.  According to the patient she was in the store walking around when she noticed some discomfort to the left leg.  She looked at the leg under her dressed and noted that it was bruised just below the knee.  Patient denies any trauma to the area.  States she is having some swelling to this area now.  Patient states her daughter is a doctor and they were concerned for possible DVT so they sent her to the emergency department for evaluation.  Patient denies any other symptoms.  Physical Exam   Triage Vital Signs: ED Triage Vitals  Encounter Vitals Group     BP 08/02/23 1502 (!) 156/86     Girls Systolic BP Percentile --      Girls Diastolic BP Percentile --      Boys Systolic BP Percentile --      Boys Diastolic BP Percentile --      Pulse Rate 08/02/23 1502 83     Resp 08/02/23 1502 18     Temp 08/02/23 1502 98.1 F (36.7 C)     Temp Source 08/02/23 1502 Oral     SpO2 08/02/23 1502 100 %     Weight 08/02/23 1503 150 lb (68 kg)     Height 08/02/23 1503 5' 8 (1.727 m)     Head Circumference --      Peak Flow --      Pain Score 08/02/23 1503 9     Pain Loc --      Pain Education --      Exclude from Growth Chart --     Most recent vital signs: Vitals:   08/02/23 1502  BP: (!) 156/86  Pulse: 83  Resp: 18  Temp: 98.1 F (36.7 C)  SpO2: 100%    General: Awake, no distress.  CV:  Good peripheral perfusion.   Resp:  Normal effort.   Abd:  No distention.   Other:  Patient has mild ecchymosis just below the knee on the left lower extremity.  No obvious calf swelling.  Patient does state some mild tenderness around the area of ecchymosis and just distal  to it.   ED Results / Procedures / Treatments   RADIOLOGY  I have reviewed interpret the x-ray images.  No fracture seen on my evaluation.   MEDICATIONS ORDERED IN ED: Medications - No data to display   IMPRESSION / MDM / ASSESSMENT AND PLAN / ED COURSE  I reviewed the triage vital signs and the nursing notes.  Patient's presentation is most consistent with acute illness / injury with system symptoms.  Patient presents to the emergency department for acute onset of swelling and bruising to the left lower extremity just below the knee.  No trauma reported per patient.  X-ray obtained in triage is negative for fracture.  Will obtain an ultrasound to rule out DVT.  Suspect likely superficial hematoma, there are varicose veins present.  Ultrasound is negative for DVT.  Positive for superficial thrombophlebitis.  Discussed baby aspirin each morning for the next 2 weeks, warm compresses to the area for 20 to 30 minutes every couple hours.  FINAL CLINICAL IMPRESSION(S) / ED DIAGNOSES   Superficial thrombophlebitis.  Note:  This document was prepared using Dragon voice recognition software and may include unintentional dictation errors.   Dorothyann Drivers, MD 08/02/23 [phone]

## 2023-08-02 NOTE — Discharge Instructions (Signed)
 As we discussed please use warm compresses to the areas for 20 to 30 minutes every couple hours for the next week or so.  For the next 2 weeks please take an 81 mg/baby aspirin each morning.  Return to the emergency department for any symptoms concerning to yourself.

## 2023-08-02 NOTE — ED Triage Notes (Addendum)
 Pt to triage via wheelchair.  Pt has pain in left lower leg x 30 minutes.  Pt reports a pinch in her leg while walking in a store.  Pt did not fall.  Pt has burning and mild swelling in left lower leg.  Pt alert  speech clear.   Pt denies chest pain or sob.  Skin warm and dry.

## 2023-08-02 NOTE — Progress Notes (Signed)
 Patient presents to triage with complaints of left lower extremity pain and swelling.  No known injury.  To ED for further evaluation and care.

## 2023-08-14 ENCOUNTER — Ambulatory Visit: Payer: Self-pay | Admitting: Internal Medicine

## 2023-08-14 ENCOUNTER — Encounter: Payer: Self-pay | Admitting: Internal Medicine

## 2023-08-14 VITALS — BP 120/82 | HR 74 | Temp 98.4°F | Resp 16 | Ht 68.0 in | Wt 158.6 lb

## 2023-08-14 DIAGNOSIS — I10 Essential (primary) hypertension: Secondary | ICD-10-CM

## 2023-08-14 DIAGNOSIS — G4709 Other insomnia: Secondary | ICD-10-CM

## 2023-08-14 DIAGNOSIS — I8002 Phlebitis and thrombophlebitis of superficial vessels of left lower extremity: Secondary | ICD-10-CM

## 2023-08-14 MED ORDER — CLONAZEPAM 0.5 MG PO TABS
0.5000 mg | ORAL_TABLET | Freq: Every day | ORAL | 3 refills | Status: AC
Start: 2023-08-14 — End: ?

## 2023-08-14 NOTE — Progress Notes (Signed)
 [facility] [address] [place], [place] [address]  Internal MEDICINE  Office Visit Note  Patient Name: Vickie Turner  [id number]  [id number]  Date of Service: 08/14/2023  Chief Complaint  Patient presents with   Follow-up   Anemia   Hypertension   Leg Pain    Left leg pain after blood clot    HPI Patient is seen today after her ED encounter. She is here with her husband.Patient went to the ED with L leg pain with bruising and swelling on 08/02/23.  She had a diagnosis of L leg thrombophlebitis after having ultrasound and X-ray of L leg, she was instructed to take aspirin and and avoid extensive ambulation. She also was found to have elevated BP during the ED encounter. Her home readings have been higher than her baseline.  She reports not sleeping well at night due to chronic insomnia and added stress due to sickness in her family. Denies any fevers, chills, nausea, vomiting, or diarrhea.     Current Medication: Outpatient Encounter Medications as of 08/14/2023  Medication Sig   acetaminophen  (TYLENOL ) 325 MG tablet Take 2 tablets (650 mg total) by mouth every 6 (six) hours as needed for mild pain, moderate pain, fever or headache (headache).   clonazePAM  (KLONOPIN ) 0.5 MG tablet Take 1 tablet (0.5 mg total) by mouth at bedtime.   estradiol  (ESTRACE ) 0.5 MG tablet Take one tab po qd for hormones   fludrocortisone  (FLORINEF ) 0.1 MG tablet Take one tab 2  a day as need for low blood pressure   nitrofurantoin , macrocrystal-monohydrate, (MACROBID ) 100 MG capsule Take 100 mg by mouth 2 (two) times daily.   omeprazole (PRILOSEC) 40 MG capsule Take 40 mg by mouth daily.   ondansetron  (ZOFRAN -ODT) 4 MG disintegrating tablet Take 1 tablet (4 mg total) by mouth every 8 (eight) hours as needed for nausea or vomiting.   pantoprazole  (PROTONIX ) 40 MG tablet Take 1 tablet (40 mg total) by mouth 2 (two) times daily.   potassium chloride  (KLOR-CON ) 8 MEQ tablet Take 1 tablet (8 mEq  total) by mouth 2 (two) times daily.   No facility-administered encounter medications on file as of 08/14/2023.    Surgical History: Past Surgical History:  Procedure Laterality Date   ABDOMINAL HYSTERECTOMY     APPENDECTOMY     BACK SURGERY     CHOLECYSTECTOMY     COLONOSCOPY WITH PROPOFOL  N/A 06/26/2018   Procedure: COLONOSCOPY WITH PROPOFOL ;  Surgeon: Toledo, Ladell POUR, MD;  Location: [facility] ENDOSCOPY;  Service: Gastroenterology;  Laterality: N/A;  NO INTERPRETER NECESSARY   ESOPHAGOGASTRODUODENOSCOPY (EGD) WITH PROPOFOL  N/A 06/26/2018   Procedure: ESOPHAGOGASTRODUODENOSCOPY (EGD) WITH PROPOFOL ;  Surgeon: Toledo, Ladell POUR, MD;  Location: [facility] ENDOSCOPY;  Service: Gastroenterology;  Laterality: N/A;  NO INTERPRETER NECESSARY   PERIPHERAL VASCULAR CATHETERIZATION  10/02/2014   Procedure: PICC Line Insertion;  Surgeon: Selinda GORMAN Gu, MD;  Location: [facility] INVASIVE CV LAB;  Service: Cardiovascular;;   TONSILLECTOMY Bilateral     Medical History: Past Medical History:  Diagnosis Date   Anemia    BRCA negative 11/2016   MyRisk neg, except BRCA 1 VUS; IBIS=5.2%   Chronic kidney disease    Family history of ovarian cancer    Urinary (tract) obstruction     Family History: Family History  Problem Relation Age of Onset   Ovarian cancer Sister 18       pat 1/2 sister   Cancer Sister 56       pat 1/2 sister--hepatobiliary  Kidney cancer Maternal Uncle 50   Anesthesia problems Paternal Uncle    Breast cancer Cousin    Heart Problems Brother    Prostate cancer Brother 39       pat 1/2 brother   Lung cancer Brother     Social History   Socioeconomic History   Marital status: Married    Spouse name: Not on file   Number of children: 4   Years of education: Not on file   Highest education level: Bachelor's degree (e.g., BA, AB, BS)  Occupational History   Not on file  Tobacco Use   Smoking status: Never   Smokeless tobacco: Never  Vaping Use   Vaping status: Never Used   Substance and Sexual Activity   Alcohol use: No   Drug use: No   Sexual activity: Yes    Birth control/protection: Surgical    Comment: Hysterectomy  Other Topics Concern   Not on file  Social History Narrative   Not on file   Social Drivers of Health   Financial Resource Strain: Not on file  Food Insecurity: No Food Insecurity (03/13/2023)   Hunger Vital Sign    Worried About Running Out of Food in the Last Year: Never true    Ran Out of Food in the Last Year: Never true  Transportation Needs: No Transportation Needs (03/13/2023)   PRAPARE - Administrator, Civil Service (Medical): No    Lack of Transportation (Non-Medical): No  Physical Activity: Not on file  Stress: Not on file  Social Connections: Not on file  Intimate Partner Violence: Not At Risk (02/13/2022)   Humiliation, Afraid, Rape, and Kick questionnaire    Fear of Current or Ex-Partner: No    Emotionally Abused: No    Physically Abused: No    Sexually Abused: No      Review of Systems  Constitutional:  Negative for fatigue and fever.  HENT:  Negative for congestion, mouth sores and postnasal drip.   Respiratory:  Negative for cough.   Cardiovascular:  Negative for chest pain.       Elevated BP  Genitourinary:  Negative for flank pain.  Musculoskeletal:        L leg pain  Skin:        Bruising  Psychiatric/Behavioral: Negative.      Vital Signs: BP 120/82   Pulse 74   Temp 98.4 F (36.9 C)   Resp 16   Ht 5' 8 (1.727 m)   Wt 158 lb 9.6 oz (71.9 kg)   SpO2 99%   BMI 24.12 kg/m    Physical Exam Constitutional:      General: She is not in acute distress.    Appearance: Normal appearance.  HENT:     Head: Normocephalic and atraumatic.     Mouth/Throat:     Pharynx: No posterior oropharyngeal erythema.  Eyes:     Extraocular Movements: Extraocular movements intact.  Skin:    Findings: Bruising present.  Neurological:     General: No focal deficit present.     Mental Status: She  is alert.  Psychiatric:        Mood and Affect: Mood normal.        Behavior: Behavior normal.        Assessment/Plan: 1. Thrombophlebitis of superficial veins of left lower extremity (Primary) Patient has moderate bruising of about 2 inches on her upper calf. May need to sclerotherapy after evaluation from vascular surgery. Will continue aspirin as  before.  - Ambulatory referral to Vascular Surgery  2. Other insomnia Patient has tried and experienced intolerance to hypnotics. Will try clonazepam . - clonazePAM  (KLONOPIN ) 0.5 MG tablet; Take 1 tablet (0.5 mg total) by mouth at bedtime.  Dispense: 30 tablet; Refill: 3  3. Elevated blood pressure reading in office with diagnosis of hypertension Patient was instructed to monitor BP at home. Seems to be situation due to added stress.    General Counseling: Patience verbalizes understanding of the findings of todays visit and agrees with plan of treatment. I have discussed any further diagnostic evaluation that may be needed or ordered today. We also reviewed her medications today. she has been encouraged to call the office with any questions or concerns that should arise related to todays visit.    Orders Placed This Encounter  Procedures   Ambulatory referral to Vascular Surgery    Meds ordered this encounter  Medications   clonazePAM  (KLONOPIN ) 0.5 MG tablet    Sig: Take 1 tablet (0.5 mg total) by mouth at bedtime.    Dispense:  30 tablet    Refill:  3    Total time spent:30 Minutes Time spent includes review of chart, medications, test results, and follow up plan with the patient.   [place] Controlled Substance Database was reviewed by me.   Dr Anntionette Madkins M Leonid Manus Internal medicine

## 2023-09-17 ENCOUNTER — Other Ambulatory Visit (INDEPENDENT_AMBULATORY_CARE_PROVIDER_SITE_OTHER): Payer: Self-pay | Admitting: Vascular Surgery

## 2023-09-17 DIAGNOSIS — I8002 Phlebitis and thrombophlebitis of superficial vessels of left lower extremity: Secondary | ICD-10-CM

## 2023-09-18 ENCOUNTER — Encounter (INDEPENDENT_AMBULATORY_CARE_PROVIDER_SITE_OTHER): Payer: Self-pay | Admitting: Vascular Surgery

## 2023-09-18 ENCOUNTER — Encounter (INDEPENDENT_AMBULATORY_CARE_PROVIDER_SITE_OTHER): Payer: Self-pay

## 2023-09-19 ENCOUNTER — Ambulatory Visit (INDEPENDENT_AMBULATORY_CARE_PROVIDER_SITE_OTHER): Payer: Self-pay

## 2023-09-19 DIAGNOSIS — I8002 Phlebitis and thrombophlebitis of superficial vessels of left lower extremity: Secondary | ICD-10-CM

## 2023-09-21 ENCOUNTER — Encounter (INDEPENDENT_AMBULATORY_CARE_PROVIDER_SITE_OTHER): Payer: Self-pay | Admitting: Vascular Surgery

## 2023-09-21 ENCOUNTER — Ambulatory Visit (INDEPENDENT_AMBULATORY_CARE_PROVIDER_SITE_OTHER): Payer: Self-pay | Admitting: Vascular Surgery

## 2023-09-21 VITALS — BP 125/79 | HR 71 | Ht 68.0 in | Wt 156.8 lb

## 2023-09-21 DIAGNOSIS — I809 Phlebitis and thrombophlebitis of unspecified site: Secondary | ICD-10-CM | POA: Insufficient documentation

## 2023-09-21 DIAGNOSIS — I8002 Phlebitis and thrombophlebitis of superficial vessels of left lower extremity: Secondary | ICD-10-CM

## 2023-09-21 NOTE — Progress Notes (Signed)
 Patient ID: Vickie Turner, female   DOB: 1970/07/18, 53 y.o.   MRN: [id number]  Chief Complaint  Patient presents with   New Patient (Initial Visit)    HPI Vickie Turner is a 53 y.o. female.  I am asked to see the patient by Dr. PHEBE Bathe for evaluation of superficial thrombophlebitis of the left lower extremity.  This happened about a month ago with no clear cause or inciting event.  The patient has had varicose veins and some swelling in her legs for some time, but no previous history of DVT or superficial thrombophlebitis to their knowledge.  There was a knot in the calf area and this has gradually gotten better.  There is still some veins in the area, but nothing as severe as before.  She has an upcoming trip, and prior to her trip she wanted to make sure this would be safe.  A venous study performed yesterday demonstrated resolution of the previous superficial thrombophlebitis, with no significant DVT or superficial thrombophlebitis at this time.     Past Medical History:  Diagnosis Date   Anemia    BRCA negative 11/2016   MyRisk neg, except BRCA 1 VUS; IBIS=5.2%   Chronic kidney disease    Family history of ovarian cancer    Urinary (tract) obstruction     Past Surgical History:  Procedure Laterality Date   ABDOMINAL HYSTERECTOMY     APPENDECTOMY     BACK SURGERY     CHOLECYSTECTOMY     COLONOSCOPY WITH PROPOFOL  N/A 06/26/2018   Procedure: COLONOSCOPY WITH PROPOFOL ;  Surgeon: Toledo, Ladell POUR, MD;  Location: [facility] ENDOSCOPY;  Service: Gastroenterology;  Laterality: N/A;  NO INTERPRETER NECESSARY   ESOPHAGOGASTRODUODENOSCOPY (EGD) WITH PROPOFOL  N/A 06/26/2018   Procedure: ESOPHAGOGASTRODUODENOSCOPY (EGD) WITH PROPOFOL ;  Surgeon: Toledo, Ladell POUR, MD;  Location: [facility] ENDOSCOPY;  Service: Gastroenterology;  Laterality: N/A;  NO INTERPRETER NECESSARY   PERIPHERAL VASCULAR CATHETERIZATION  10/02/2014   Procedure: PICC Line Insertion;  Surgeon: Selinda GORMAN Gu, MD;  Location: [facility]  INVASIVE CV LAB;  Service: Cardiovascular;;   TONSILLECTOMY Bilateral      Family History  Problem Relation Age of Onset   Ovarian cancer Sister 41       pat 1/2 sister   Cancer Sister 65       pat 1/2 sister--hepatobiliary   Kidney cancer Maternal Uncle 50   Anesthesia problems Paternal Uncle    Breast cancer Cousin    Heart Problems Brother    Prostate cancer Brother 45       pat 1/2 brother   Lung cancer Brother       Social History   Tobacco Use   Smoking status: Never   Smokeless tobacco: Never  Vaping Use   Vaping status: Never Used  Substance Use Topics   Alcohol use: No   Drug use: No     Allergies  Allergen Reactions   Sulfa Antibiotics Rash and Shortness Of Breath   Other     Pork- religion contradiction     Tape     Other reaction(s): Other (See Comments) PLASTIC MEDICAL TAPE, Unknown Kind of Tape    Current Outpatient Medications  Medication Sig Dispense Refill   acetaminophen  (TYLENOL ) 325 MG tablet Take 2 tablets (650 mg total) by mouth every 6 (six) hours as needed for mild pain, moderate pain, fever or headache (headache).     estradiol  (ESTRACE ) 0.5 MG tablet Take one tab po qd for hormones 90  tablet 1   omeprazole (PRILOSEC) 40 MG capsule Take 40 mg by mouth daily.     clonazePAM  (KLONOPIN ) 0.5 MG tablet Take 1 tablet (0.5 mg total) by mouth at bedtime. (Patient not taking: Reported on 09/21/2023) 30 tablet 3   fludrocortisone  (FLORINEF ) 0.1 MG tablet Take one tab 2  a day as need for low blood pressure 60 tablet 3   nitrofurantoin , macrocrystal-monohydrate, (MACROBID ) 100 MG capsule Take 100 mg by mouth 2 (two) times daily. (Patient not taking: Reported on 09/21/2023)     ondansetron  (ZOFRAN -ODT) 4 MG disintegrating tablet Take 1 tablet (4 mg total) by mouth every 8 (eight) hours as needed for nausea or vomiting. (Patient not taking: Reported on 09/21/2023) 20 tablet 0   pantoprazole  (PROTONIX ) 40 MG tablet Take 1 tablet (40 mg total) by mouth 2  (two) times daily. (Patient not taking: Reported on 09/21/2023) 60 tablet 0   potassium chloride  (KLOR-CON ) 8 MEQ tablet Take 1 tablet (8 mEq total) by mouth 2 (two) times daily. (Patient not taking: Reported on 09/21/2023) 60 tablet 3   No current facility-administered medications for this visit.      REVIEW OF SYSTEMS (Negative unless checked)  Constitutional: [] Weight loss  [] Fever  [] Chills Cardiac: [] Chest pain   [] Chest pressure   [] Palpitations   [] Shortness of breath when laying flat   [] Shortness of breath at rest   [] Shortness of breath with exertion. Vascular:  [] Pain in legs with walking   [] Pain in legs at rest   [] Pain in legs when laying flat   [] Claudication   [] Pain in feet when walking  [] Pain in feet at rest  [] Pain in feet when laying flat   [] History of DVT   [x] Phlebitis   [x] Swelling in legs   [x] Varicose veins   [] Non-healing ulcers Pulmonary:   [] Uses home oxygen   [] Productive cough   [] Hemoptysis   [] Wheeze  [] COPD   [] Asthma Neurologic:  [] Dizziness  [] Blackouts   [] Seizures   [] History of stroke   [] History of TIA  [] Aphasia   [] Temporary blindness   [] Dysphagia   [] Weakness or numbness in arms   [] Weakness or numbness in legs Musculoskeletal:  [] Arthritis   [] Joint swelling   [] Joint pain   [] Low back pain Hematologic:  [] Easy bruising  [] Easy bleeding   [] Hypercoagulable state   [x] Anemic  [] Hepatitis Gastrointestinal:  [] Blood in stool   [] Vomiting blood  [] Gastroesophageal reflux/heartburn   [] Abdominal pain Genitourinary:  [] Chronic kidney disease   [] Difficult urination  [] Frequent urination  [] Burning with urination   [] Hematuria Skin:  [] Rashes   [] Ulcers   [] Wounds Psychological:  [] History of anxiety   []  History of major depression.    Physical Exam BP 125/79   Pulse 71   Ht 5' 8 (1.727 m)   Wt 156 lb 12.8 oz (71.1 kg)   BMI 23.84 kg/m  Gen:  WD/WN, NAD. Appears younger than stated age. Head: [place]/AT, No temporalis wasting.  Ear/Nose/Throat:  Hearing grossly intact, nares w/o erythema or drainage, oropharynx w/o Erythema/Exudate Eyes: Conjunctiva clear, sclera non-icteric  Neck: trachea midline.  No JVD.  Pulmonary:  Good air movement, respirations not labored, no use of accessory muscles  Cardiac: RRR, no JVD Vascular: scattered varicosities of the lower extremities. Vessel Right Left  Radial Palpable Palpable  Gastrointestinal:. No masses, surgical incisions, or scars. Musculoskeletal: M/S 5/5 throughout.  Extremities without ischemic changes.  No deformity or atrophy. Trace LE edema. Neurologic: Sensation grossly intact in extremities.  Symmetrical.  Speech is fluent. Motor exam as listed above. Psychiatric: Judgment intact, Mood & affect appropriate for pt's clinical situation. Dermatologic: No rashes or ulcers noted.  No cellulitis or open wounds.    Radiology VAS US  LOWER EXTREMITY VENOUS REFLUX Result Date: 09/19/2023  Lower Venous Reflux Study Patient Name:  STUART GUILLEN [facility]  Date of Exam:   09/19/2023 Medical Rec #: [id number]      Accession #:    [id number] Date of Birth: October 16, 1970       Patient Gender: F Patient Age:   89 years Exam Location:  [facility] Procedure:      VAS US  LOWER EXTREMITY VENOUS REFLUX Referring Phys: SELINDA GU --------------------------------------------------------------------------------  Indications: Varicosities.  Performing Technologist: Jerel Croak RVT  Examination Guidelines: A complete evaluation includes B-mode imaging, spectral Doppler, color Doppler, and power Doppler as needed of all accessible portions of each vessel. Bilateral testing is considered an integral part of a complete examination. Limited examinations for reoccurring indications may be performed as noted. The reflux portion of the exam is performed with the patient in reverse Trendelenburg. Significant venous reflux is defined as >500 ms in the superficial venous system, and >1  second in the deep venous system.  Summary: Left: - No evidence of deep vein thrombosis seen in the left lower extremity, from the common femoral through the popliteal veins. - No evidence of superficial venous thrombosis in the left lower extremity. - There is no evidence of venous reflux seen in the left lower extremity. - No evidence of superficial venous reflux seen in the left greater saphenous vein. - No evidence of superficial venous reflux seen in the left short saphenous vein.  *See table(s) above for measurements and observations. Electronically signed by SELINDA GU MD on 09/19/2023 at 2:58:07 PM.    Final     Labs Recent Results (from the past 2160 hours)  CBG monitoring, ED     Status: None   Collection Time: 07/28/23  3:30 PM  Result Value Ref Range   Glucose-Capillary 99 70 - 99 mg/dL    Comment: Glucose reference range applies only to samples taken after fasting for at least 8 hours.  Comprehensive metabolic panel     Status: Abnormal   Collection Time: 07/28/23  4:00 PM  Result Value Ref Range   Sodium 141 135 - 145 mmol/L   Potassium 3.4 (L) 3.5 - 5.1 mmol/L   Chloride 104 98 - 111 mmol/L   CO2 26 22 - 32 mmol/L   Glucose, Bld 98 70 - 99 mg/dL    Comment: Glucose reference range applies only to samples taken after fasting for at least 8 hours.   BUN 13 6 - 20 mg/dL   Creatinine, Ser 9.17 0.44 - 1.00 mg/dL   Calcium 8.7 (L) 8.9 - 10.3 mg/dL   Total Protein 6.6 6.5 - 8.1 g/dL   Albumin 3.7 3.5 - 5.0 g/dL   AST 24 15 - 41 U/L   ALT 18 0 - 44 U/L   Alkaline Phosphatase 106 38 - 126 U/L   Total Bilirubin 0.6 0.0 - 1.2 mg/dL   GFR, Estimated >39 >39 mL/min    Comment: (NOTE) Calculated using the CKD-EPI Creatinine Equation (2021)    Anion gap 11 5 - 15    Comment:  Performed at [facility], [address] [place], [place] [address]  CBC     Status: Abnormal   Collection Time: 07/28/23  4:00 PM  Result Value Ref Range   WBC 6.1 4.0 - 10.5 K/uL   RBC 4.06 3.87  - 5.11 MIL/uL   Hemoglobin 11.7 (L) 12.0 - 15.0 g/dL   HCT 65.9 (L) 63.9 - 53.9 %   MCV 83.7 80.0 - 100.0 fL   MCH 28.8 26.0 - 34.0 pg   MCHC 34.4 30.0 - 36.0 g/dL   RDW 87.2 88.4 - 84.4 %   Platelets 208 150 - 400 K/uL   nRBC 0.0 0.0 - 0.2 %    Comment: Performed at [facility], [address], [place], [place] [address]  Troponin I (High Sensitivity)     Status: None   Collection Time: 07/28/23  4:00 PM  Result Value Ref Range   Troponin I (High Sensitivity) 6 <18 ng/L    Comment: (NOTE) Elevated high sensitivity troponin I (hsTnI) values and significant  changes across serial measurements may suggest ACS but many other  chronic and acute conditions are known to elevate hsTnI results.  Refer to the Links section for chest pain algorithms and additional  guidance. Performed at [facility], [address] [place], [place] [address]   Magnesium     Status: None   Collection Time: 07/28/23  4:00 PM  Result Value Ref Range   Magnesium 2.1 1.7 - 2.4 mg/dL    Comment: Performed at [facility], [address] [place], [place] [address]  TSH     Status: None   Collection Time: 07/28/23  4:00 PM  Result Value Ref Range   TSH 1.530 0.350 - 4.500 uIU/mL    Comment: Performed by a 3rd Generation assay with a functional sensitivity of <=0.01 uIU/mL. Performed at [facility], [address] [place], [place] [address]   T4, free     Status: None   Collection Time: 07/28/23  4:00 PM  Result Value Ref Range   Free T4 0.80 0.61 - 1.12 ng/dL    Comment: (NOTE) Biotin ingestion may interfere with free T4 tests. If the results are inconsistent with the TSH level, previous test results, or the clinical presentation, then consider biotin interference. If needed, order repeat testing after stopping biotin. Performed at [facility], [address] [place], [place] [address]     Assessment/Plan:  Superficial thrombophlebitis A  venous study performed yesterday demonstrated resolution of the previous superficial thrombophlebitis, with no significant DVT or superficial thrombophlebitis at this time.   At this point, no further treatment is necessary for the superficial thrombophlebitis.  For her upcoming trip, I have strongly recommended she wear compression socks, get up and walk every 60 to 90 minutes, and take a baby aspirin daily to try to avoid recurrent thrombosis.  She and her husband voiced their understanding and are agreeable       Selinda Gu 09/21/2023, 12:28 PM   This note was created with Dragon medical transcription system.  Any errors from dictation are unintentional.

## 2023-09-21 NOTE — Assessment & Plan Note (Signed)
 A venous study performed yesterday demonstrated resolution of the previous superficial thrombophlebitis, with no significant DVT or superficial thrombophlebitis at this time.   At this point, no further treatment is necessary for the superficial thrombophlebitis.  For her upcoming trip, I have strongly recommended she wear compression socks, get up and walk every 60 to 90 minutes, and take a baby aspirin daily to try to avoid recurrent thrombosis.  She and her husband voiced their understanding and are agreeable

## 2023-09-22 ENCOUNTER — Ambulatory Visit: Payer: Self-pay | Admitting: Internal Medicine
